# Patient Record
Sex: Male | Born: 1940
Health system: Southern US, Community
[De-identification: ages and names within clinical notes are randomized; demographics above are authoritative.]

## PROBLEM LIST (undated history)

## (undated) DIAGNOSIS — M47812 Spondylosis without myelopathy or radiculopathy, cervical region: Secondary | ICD-10-CM

## (undated) DIAGNOSIS — R011 Cardiac murmur, unspecified: Secondary | ICD-10-CM

## (undated) DIAGNOSIS — T7840XA Allergy, unspecified, initial encounter: Secondary | ICD-10-CM

## (undated) DIAGNOSIS — R7611 Nonspecific reaction to tuberculin skin test without active tuberculosis: Secondary | ICD-10-CM

## (undated) HISTORY — PX: DENTAL SURGERY: SHX609

## (undated) HISTORY — PX: TONSILLECTOMY: SUR1361

## (undated) HISTORY — DX: Cardiac murmur, unspecified: R01.1

## (undated) HISTORY — DX: Spondylosis without myelopathy or radiculopathy, cervical region: M47.812

## (undated) HISTORY — DX: Nonspecific reaction to tuberculin skin test without active tuberculosis: R76.11

## (undated) HISTORY — DX: Allergy, unspecified, initial encounter: T78.40XA

---

## 2011-01-20 HISTORY — PX: COLONOSCOPY: SHX174

## 2011-01-20 HISTORY — PX: UPPER GASTROINTESTINAL ENDOSCOPY: SHX188

## 2011-02-06 LAB — HM COLONOSCOPY

## 2012-02-25 ENCOUNTER — Ambulatory Visit (INDEPENDENT_AMBULATORY_CARE_PROVIDER_SITE_OTHER): Payer: Medicare HMO | Admitting: Internal Medicine

## 2012-02-25 ENCOUNTER — Encounter: Payer: Self-pay | Admitting: Internal Medicine

## 2012-02-25 ENCOUNTER — Ambulatory Visit (HOSPITAL_BASED_OUTPATIENT_CLINIC_OR_DEPARTMENT_OTHER)
Admission: RE | Admit: 2012-02-25 | Discharge: 2012-02-25 | Disposition: A | Discharge status: Home / Self Care | Payer: Medicare HMO | Source: Ambulatory Visit | Attending: Internal Medicine | Admitting: Internal Medicine

## 2012-02-25 VITALS — BP 124/70 | HR 84 | Temp 98.3°F | Resp 16 | Ht 66.5 in | Wt 168.0 lb

## 2012-02-25 DIAGNOSIS — R053 Chronic cough: Secondary | ICD-10-CM | POA: Insufficient documentation

## 2012-02-25 DIAGNOSIS — R05 Cough: Secondary | ICD-10-CM

## 2012-02-25 DIAGNOSIS — R0789 Other chest pain: Secondary | ICD-10-CM

## 2012-02-25 DIAGNOSIS — I517 Cardiomegaly: Secondary | ICD-10-CM | POA: Insufficient documentation

## 2012-02-25 DIAGNOSIS — M47812 Spondylosis without myelopathy or radiculopathy, cervical region: Secondary | ICD-10-CM | POA: Insufficient documentation

## 2012-02-25 DIAGNOSIS — Z0000 Encounter for general adult medical examination without abnormal findings: Secondary | ICD-10-CM | POA: Insufficient documentation

## 2012-02-25 DIAGNOSIS — U071 COVID-19: Secondary | ICD-10-CM | POA: Insufficient documentation

## 2012-02-25 DIAGNOSIS — D649 Anemia, unspecified: Secondary | ICD-10-CM

## 2012-02-25 DIAGNOSIS — R059 Cough, unspecified: Secondary | ICD-10-CM

## 2012-02-25 DIAGNOSIS — R7611 Nonspecific reaction to tuberculin skin test without active tuberculosis: Secondary | ICD-10-CM | POA: Insufficient documentation

## 2012-02-25 DIAGNOSIS — K648 Other hemorrhoids: Secondary | ICD-10-CM | POA: Insufficient documentation

## 2012-02-25 DIAGNOSIS — R079 Chest pain, unspecified: Secondary | ICD-10-CM

## 2012-02-25 MED ORDER — LEVOFLOXACIN 500 MG PO TABS: 500.0000 mg | ORAL_TABLET | Freq: Every day | ORAL | Status: DC

## 2012-02-25 NOTE — Assessment & Plan Note (Signed)
EKG obtained demonstrates NSR 63 with first degree AV block. Normal axis. No evidence of acute ischemic change. Obtain CXR

## 2012-02-25 NOTE — Addendum Note (Signed)
Addended by: Regis Bill on: 02/25/2012 05:22 PM   Modules accepted: Orders

## 2012-02-25 NOTE — Assessment & Plan Note (Signed)
Reports since URI now s/p two courses of amox. Obtain cxr and begin 7d course of levaquin. Schedule 3 wk f/u.

## 2012-02-25 NOTE — Assessment & Plan Note (Signed)
Resolved with colonoscopy/egd 2012. hgb nl 10/13. Consider repeat cbc in the spring for stability

## 2012-02-25 NOTE — Progress Notes (Signed)
  Subjective:    Patient ID: Bradley Peterson, male    DOB: 1940-09-21, 72 y.o.   MRN: [id number]  HPI Pt presents to clinic to establish care and for evaluation of cough. Had his physical at [facility] Oct 2013 and nl labs are reviewed with pt. Since that time had URI with persistent cough tx'ed with two courses of amoxicillin. Improved with both courses however NP cough has persisted. Also notes intermittent associated left sided chest pain that began after the cough. Sx's are nonradiating and non exertional-not being reproduced with his regular walk of ~1.5 miles. Takes a daily asa and recalls reportedly nl stress test ~ 4 years ago.   Has h/o chronic neck pain previously followed by neurology. Currently takes only glucosamine for the problem. Recalls h/o +PPD s/p INH with multiple reportedly nl CXR's. Denies sweats or hemoptysis. Did have single episode of left knee effusion s/p arthrocentesis with no recurrence. Was previously anemic 2012 and underwent colonoscopy demonstrating IH only and had nl EGD as well. Was recommended for 5y follow up with colonoscopy. No fam hx of colon cancer.   Has recevied flu vaccine for the season. Recalls pneumovax ~2012, tdap within 4 years and s/p zostavax.     Review of Systems  Respiratory: Positive for cough. Negative for shortness of breath.   Cardiovascular: Positive for chest pain.  Musculoskeletal: Positive for arthralgias.  All other systems reviewed and are negative.       Objective:   Physical Exam  Nursing note and vitals reviewed. Constitutional: He appears well-developed and well-nourished. No distress.  HENT:  Head: Normocephalic and atraumatic.  Right Ear: External ear normal.  Left Ear: External ear normal.  Nose: Nose normal.  Mouth/Throat: Oropharynx is clear and moist. No oropharyngeal exudate.  Eyes: Conjunctivae normal are normal. No scleral icterus.  Neck: Neck supple. No JVD present. Carotid bruit is not present. No  thyromegaly present.  Cardiovascular: Normal rate, regular rhythm and normal heart sounds.  Exam reveals no gallop and no friction rub.   No murmur heard. Pulmonary/Chest: Effort normal and breath sounds normal. No respiratory distress. He has no wheezes. He has no rales.  Lymphadenopathy:    He has no cervical adenopathy.  Neurological: He is alert.  Skin: Skin is warm and dry. He is not diaphoretic.  Psychiatric: He has a normal mood and affect.          Assessment & Plan:

## 2012-03-17 ENCOUNTER — Ambulatory Visit: Payer: Self-pay | Admitting: Family Medicine

## 2012-04-17 ENCOUNTER — Ambulatory Visit (INDEPENDENT_AMBULATORY_CARE_PROVIDER_SITE_OTHER): Payer: Medicare HMO | Admitting: Family

## 2012-04-17 ENCOUNTER — Encounter: Payer: Self-pay | Admitting: Family

## 2012-04-17 VITALS — BP 116/74 | HR 57 | Temp 98.4°F | Resp 16

## 2012-04-17 DIAGNOSIS — J209 Acute bronchitis, unspecified: Secondary | ICD-10-CM

## 2012-04-17 MED ORDER — BENZONATATE 100 MG PO CAPS: 100.0000 mg | ORAL_CAPSULE | Freq: Three times a day (TID) | ORAL | Status: DC | PRN

## 2012-04-17 MED ORDER — NEOMYCIN-POLYMYXIN-HC OP SUSP: 1.0000 [drp] | Freq: Four times a day (QID) | OPHTHALMIC | Status: AC

## 2012-04-17 MED ORDER — AZITHROMYCIN 250 MG PO TABS: ORAL_TABLET | ORAL | Status: DC

## 2012-04-17 NOTE — Patient Instructions (Addendum)
Please call if symptoms worsen or if not improved in 2-3 days.   

## 2012-04-17 NOTE — Progress Notes (Signed)
  Subjective:    Patient ID: Bradley Peterson, male    DOB: [birth year], 72 y.o.   MRN: [id number]  HPI  Bradley Peterson is a 72 yr old male who presents today with complaint of hoarseness, nasal congestion, cough.  Symptoms started 1 week ago.  Also has reports of "matting of the eyes in the morning."  Cough is worse at night and early in the AM.  Productive of green/yellow phlegm.  He denies associated fever. Has tried robitussin, cough/cold prep otc with minimal improvement.      Review of Systems See HPI  Past Medical History  Diagnosis Date  . Arthritis of neck   . Positive TB test     History   Social History  . Marital Status: Married    Spouse Name: N/A    Number of Children: N/A  . Years of Education: N/A   Occupational History  . Not on file.   Social History Main Topics  . Smoking status: Former Smoker    Types: Pipe  . Smokeless tobacco: Not on file     Comment: >25 years ago, pipe only.  . Alcohol Use: Not on file  . Drug Use: Not on file  . Sexually Active: Not on file   Other Topics Concern  . Not on file   Social History Narrative  . No narrative on file    Past Surgical History  Procedure Laterality Date  . Tonsillectomy      Late 65s    Family History  Problem Relation Age of Onset  . Heart disease Father     Deceased  . Arthritis Father   . Arthritis Mother     Living 28    No Known Allergies  Current Outpatient Prescriptions on File Prior to Visit  Medication Sig Dispense Refill  . Cholecalciferol (VITAMIN D3) 2000 UNITS capsule Take 2,000 Units by mouth daily.      . Ferrous Sulfate (IRON) 325 (65 FE) MG TABS Take 1 tablet by mouth daily.      Marland Kitchen glucosamine-chondroitin 500-400 MG tablet Take 1 tablet by mouth daily.      . NON FORMULARY as needed. OTC SINUS Medication.       No current facility-administered medications on file prior to visit.    BP 116/74  Pulse 57  Temp(Src) 98.4 F (36.9 C) (Oral)  Resp 16  SpO2  97%       Objective:   Physical Exam  Constitutional: He is oriented to person, place, and time. He appears well-developed and well-nourished. No distress.  Cardiovascular: Normal rate and regular rhythm.   No murmur heard. Pulmonary/Chest: Effort normal and breath sounds normal. No respiratory distress. He has no wheezes. He has no rales. He exhibits no tenderness.  Musculoskeletal: He exhibits no edema.  Neurological: He is alert and oriented to person, place, and time.  Psychiatric: He has a normal mood and affect. His behavior is normal. Judgment and thought content normal.          Assessment & Plan:

## 2012-04-19 DIAGNOSIS — J209 Acute bronchitis, unspecified: Secondary | ICD-10-CM | POA: Insufficient documentation

## 2012-04-19 NOTE — Assessment & Plan Note (Signed)
rx with zpak, add tessalon prn.

## 2012-09-09 ENCOUNTER — Encounter: Payer: Self-pay | Admitting: Family Medicine

## 2012-09-09 ENCOUNTER — Ambulatory Visit (INDEPENDENT_AMBULATORY_CARE_PROVIDER_SITE_OTHER): Payer: Medicare HMO | Admitting: Family Medicine

## 2012-09-09 VITALS — BP 118/80 | HR 56 | Temp 98.1°F | Ht 66.5 in | Wt 164.1 lb

## 2012-09-09 DIAGNOSIS — H60392 Other infective otitis externa, left ear: Secondary | ICD-10-CM

## 2012-09-09 DIAGNOSIS — H60399 Other infective otitis externa, unspecified ear: Secondary | ICD-10-CM | POA: Insufficient documentation

## 2012-09-09 MED ORDER — NEOMYCIN-POLYMYXIN-HC 3.5-10000-1 OT SOLN: 3.0000 [drp] | Freq: Three times a day (TID) | OTIC | Status: DC

## 2012-09-09 NOTE — Progress Notes (Signed)
Patient ID: Bradley Peterson, male   DOB: 06-08-1940, 72 y.o.   MRN: [id number] Bradley Peterson [id number] 1940/06/12 09/09/2012      Progress Note-Follow Up  Subjective  Chief Complaint  Chief Complaint  Patient presents with  . Otalgia    left ear hurts more than right X since sat    HPI  Patient is a 72 year old Caucasian male who is in today complaining of 4 days worth of increasing left ear pain. He describes a mild itching and irritation. A sense of some mild pressure but no severe pain. Has had some mild discharge in the year as well. Denies any fevers or chills. Does have ongoing sinus issues appear stable. No malaise myalgias. No chest pain, palpitations GI concerns or other acute symptoms.  Past Medical History  Diagnosis Date  . Arthritis of neck   . Positive TB test     Past Surgical History  Procedure Laterality Date  . Tonsillectomy      Late 5s    Family History  Problem Relation Age of Onset  . Heart disease Father     Deceased  . Arthritis Father   . Arthritis Mother     Living 71    History   Social History  . Marital Status: Married    Spouse Name: N/A    Number of Children: N/A  . Years of Education: N/A   Occupational History  . Not on file.   Social History Main Topics  . Smoking status: Former Smoker    Types: Pipe  . Smokeless tobacco: Not on file     Comment: >25 years ago, pipe only.  . Alcohol Use: Not on file  . Drug Use: Not on file  . Sexually Active: Not on file   Other Topics Concern  . Not on file   Social History Narrative  . No narrative on file    Current Outpatient Prescriptions on File Prior to Visit  Medication Sig Dispense Refill  . aspirin 81 MG tablet Take 81 mg by mouth daily.      . Cholecalciferol (VITAMIN D3) 2000 UNITS capsule Take 2,000 Units by mouth daily.      . Ferrous Sulfate (IRON) 325 (65 FE) MG TABS Take 1 tablet by mouth daily.      Marland Kitchen glucosamine-chondroitin 500-400 MG tablet Take 1 tablet  by mouth daily.      . NON FORMULARY as needed. OTC SINUS Medication.       No current facility-administered medications on file prior to visit.    No Known Allergies  Review of Systems  Review of Systems  Constitutional: Negative for fever and malaise/fatigue.  HENT: Negative for congestion.   Eyes: Negative for discharge.  Respiratory: Negative for shortness of breath.   Cardiovascular: Negative for chest pain, palpitations, claudication and leg swelling.  Gastrointestinal: Negative for nausea, abdominal pain and diarrhea.  Genitourinary: Negative for dysuria.  Musculoskeletal: Negative for falls.  Skin: Negative for rash.  Neurological: Negative for loss of consciousness and headaches.  Endo/Heme/Allergies: Negative for polydipsia.  Psychiatric/Behavioral: Negative for depression and suicidal ideas. The patient is not nervous/anxious and does not have insomnia.     Objective  BP 118/80  Pulse 56  Temp(Src) 98.1 F (36.7 C) (Oral)  Ht 5' 6.5" (1.689 m)  Wt 164 lb 1.3 oz (74.426 kg)  BMI 26.09 kg/m2  SpO2 95%  Physical Exam  Physical Exam  Constitutional: He is oriented to person, place, and time and  well-developed, well-nourished, and in no distress. No distress.  HENT:  Head: Normocephalic and atraumatic.  Left external canal erythematous with clear fluid in canal and mild edema, TMs clear b/l. Left posterior auricular LN enlarged mildly tender  Eyes: Conjunctivae are normal.  Neck: Neck supple. No thyromegaly present.  Cardiovascular: Normal rate, regular rhythm and normal heart sounds.   No murmur heard. Pulmonary/Chest: Effort normal and breath sounds normal. No respiratory distress.  Abdominal: Soft. Bowel sounds are normal. He exhibits no distension and no mass. There is no tenderness.  Musculoskeletal: He exhibits no edema.  Neurological: He is alert and oriented to person, place, and time.  Skin: Skin is warm.  Psychiatric: Memory, affect and judgment  normal.      Assessment & Plan  Otitis, externa, infective Cortisporin Otic 3 drops left ear tid

## 2012-09-09 NOTE — Patient Instructions (Addendum)
Otitis Externa Otitis externa is a bacterial or fungal infection of the outer ear canal. This is the area from the eardrum to the outside of the ear. Otitis externa is sometimes called "swimmer's ear." CAUSES  Possible causes of infection include:  Swimming in dirty water.  Moisture remaining in the ear after swimming or bathing.  Mild injury (trauma) to the ear.  Objects stuck in the ear (foreign body).  Cuts or scrapes (abrasions) on the outside of the ear. SYMPTOMS  The first symptom of infection is often itching in the ear canal. Later signs and symptoms may include swelling and redness of the ear canal, ear pain, and yellowish-white fluid (pus) coming from the ear. The ear pain may be worse when pulling on the earlobe. DIAGNOSIS  Your caregiver will perform a physical exam. A sample of fluid may be taken from the ear and examined for bacteria or fungi. TREATMENT  Antibiotic ear drops are often given for 10 to 14 days. Treatment may also include pain medicine or corticosteroids to reduce itching and swelling. PREVENTION   Keep your ear dry. Use the corner of a towel to absorb water out of the ear canal after swimming or bathing.  Avoid scratching or putting objects inside your ear. This can damage the ear canal or remove the protective wax that lines the canal. This makes it easier for bacteria and fungi to grow.  Avoid swimming in lakes, polluted water, or poorly chlorinated pools.  You may use ear drops made of rubbing alcohol and vinegar after swimming. Combine equal parts of white vinegar and alcohol in a bottle. Put 3 or 4 drops into each ear after swimming. HOME CARE INSTRUCTIONS   Apply antibiotic ear drops to the ear canal as prescribed by your caregiver.  Only take over-the-counter or prescription medicines for pain, discomfort, or fever as directed by your caregiver.  If you have diabetes, follow any additional treatment instructions from your caregiver.  Keep all  follow-up appointments as directed by your caregiver. SEEK MEDICAL CARE IF:   You have a fever.  Your ear is still red, swollen, painful, or draining pus after 3 days.  Your redness, swelling, or pain gets worse.  You have a severe headache.  You have redness, swelling, pain, or tenderness in the area behind your ear. MAKE SURE YOU:   Understand these instructions.  Will watch your condition.  Will get help right away if you are not doing well or get worse. Document Released: 02/05/2005 Document Revised: 04/30/2011 Document Reviewed: 02/22/2011 [employer] Patient Information 2014 [employer].  

## 2012-09-09 NOTE — Assessment & Plan Note (Signed)
Cortisporin Otic 3 drops left ear tid

## 2012-11-13 ENCOUNTER — Telehealth: Payer: Self-pay | Admitting: Family Medicine

## 2012-11-13 DIAGNOSIS — D649 Anemia, unspecified: Secondary | ICD-10-CM

## 2012-11-13 DIAGNOSIS — Z0000 Encounter for general adult medical examination without abnormal findings: Secondary | ICD-10-CM

## 2012-11-13 NOTE — Telephone Encounter (Signed)
Patient has cpe on 12/23/12 and will be going to [facility] lab

## 2012-11-13 NOTE — Telephone Encounter (Signed)
Preventative lipid renal, cbc, tsh, hepatic, psa

## 2012-11-13 NOTE — Telephone Encounter (Signed)
Please advise 

## 2012-11-13 NOTE — Telephone Encounter (Signed)
Please advise what labs you would like ordered.

## 2012-11-14 NOTE — Addendum Note (Signed)
Addended by: Court Joy on: 11/14/2012 10:11 AM   Modules accepted: Orders

## 2012-12-16 LAB — LIPID PANEL: LDL Cholesterol: 93 mg/dL (ref 0–99)

## 2012-12-16 LAB — RENAL FUNCTION PANEL
CO2: 25 mEq/L (ref 19–32)
Chloride: 104 mEq/L (ref 96–112)
Potassium: 4.3 mEq/L (ref 3.5–5.3)
Sodium: 140 mEq/L (ref 135–145)

## 2012-12-16 LAB — CBC
HCT: 40.7 % (ref 39.0–52.0)
MCV: 89.6 fL (ref 78.0–100.0)
RBC: 4.54 MIL/uL (ref 4.22–5.81)
WBC: 5.3 10*3/uL (ref 4.0–10.5)

## 2012-12-16 LAB — HEPATIC FUNCTION PANEL
ALT: 19 U/L (ref 0–53)
AST: 22 U/L (ref 0–37)
Bilirubin, Direct: 0.2 mg/dL (ref 0.0–0.3)
Indirect Bilirubin: 0.4 mg/dL (ref 0.0–0.9)

## 2012-12-23 ENCOUNTER — Ambulatory Visit (INDEPENDENT_AMBULATORY_CARE_PROVIDER_SITE_OTHER): Payer: Medicare HMO | Admitting: Family Medicine

## 2012-12-23 ENCOUNTER — Telehealth: Payer: Self-pay | Admitting: Family Medicine

## 2012-12-23 ENCOUNTER — Encounter: Payer: Self-pay | Admitting: Family Medicine

## 2012-12-23 VITALS — BP 126/80 | HR 59 | Temp 98.1°F | Ht 66.5 in | Wt 162.0 lb

## 2012-12-23 DIAGNOSIS — R0789 Other chest pain: Secondary | ICD-10-CM

## 2012-12-23 DIAGNOSIS — I517 Cardiomegaly: Secondary | ICD-10-CM

## 2012-12-23 DIAGNOSIS — Z0000 Encounter for general adult medical examination without abnormal findings: Secondary | ICD-10-CM

## 2012-12-23 DIAGNOSIS — M47812 Spondylosis without myelopathy or radiculopathy, cervical region: Secondary | ICD-10-CM

## 2012-12-23 DIAGNOSIS — R011 Cardiac murmur, unspecified: Secondary | ICD-10-CM

## 2012-12-23 DIAGNOSIS — Z23 Encounter for immunization: Secondary | ICD-10-CM

## 2012-12-23 DIAGNOSIS — D649 Anemia, unspecified: Secondary | ICD-10-CM

## 2012-12-23 MED ORDER — FLUTICASONE PROPIONATE 50 MCG/ACT NA SUSP: 2.0000 | Freq: Every day | NASAL | Status: DC | PRN

## 2012-12-23 NOTE — Patient Instructions (Addendum)
Salon pas patches or cream Probiotic daily such as Digestive Advantage Zyrtec/cetirizine 10 mg daily Saline nasal flush once to twice daily   Heart Murmur A heart murmur is an extra sound heard by your caregiver when listening to your heart with a device called a stethoscope. The sound might be a "hum" or "whoosh" sound heard when the heart beats. The sound comes from turbulence when blood flows through the heart. There are two types of heart murmurs:  Innocent (Harmless) murmurs: Most people with this type of heart murmur do not have signs or symptoms of heart problems. Many children have innocent heart murmurs. When an innocent heart murmur is found, there is no need to get tests or do treatment. Also, there is no need to restrict activities or stop playing sports. Innocent heart murmurs may be caused by many things. For example, it might be caused by a tiny hole or defect in the wall of the heart. These defects often close as a child grows. An innocent heart murmur may be heard by an examining clinician throughout your life. If you see a new caregiver, please let him or her know this was found during past exams.  Abnormal murmurs: May have signs and symptoms of heart problems. These types of murmurs can occur in children and adults. In children, abnormal heart murmurs are typically caused from heart defects that are present at birth. In adults, abnormal murmurs are usually from heart valve problems caused by disease, infection, or aging. SYMPTOMS   Innocent (Harmless) murmurs do not cause symptoms or require you to limit physical activity.  Many people with abnormal murmurs may or may not have symptoms. If symptoms do develop, they might include:  Shortness of breath.  Blue coloring of the skin, especially on the fingertips.  Chest pain.  Palpitations or feeling a "fluttering" or a "skipped" heart beat.  Fainting.  Persistent cough.  Getting tired much faster than  expected. DIAGNOSIS  A heart murmur might be heard during a pre-sports physical or during any type of examination. When a murmur is heard, it may suggest a possible problem. When this happens, your caregiver may ask you to see a heart specialist (cardiologist). You may also be asked to undergo one or more heart tests. In these cases, testing may vary depending upon what your caregiver heard. Tests for a heart murmur might include one or more of the following:  EKG (electrocardiogram).  Echocardiogram.  Cardiac MRI. For children and adults who have an abnormal heart murmur and want to play sports, it is important to complete testing, review test results, and receive recommendations from your caregiver. If heart disease is present, it may be risky to play. Finding out the results of your test Not all test results are available during your visit. If your test results are not back during the visit, make an appointment with your caregiver to find out the results. Do not assume everything is normal if you have not heard from your caregiver or the medical facility. It is important for you to follow up on all of your test results.  TREATMENT  As noted above, innocent (harmless) murmurs require no treatment or activity restriction. If the murmur represents a problem with the heart, treatment will depend upon the exact nature of the problem. In these cases, medicine or surgery may be needed to treat the problem. HOME CARE INSTRUCTIONS If you want to participate in sports or other types of strenuous physical activity, it is important to discuss  this first with your caregiver. If the murmur represents a problem with the heart and you choose to participate in sports, there is a small chance that a serious problem (including sudden death) could result.  SEEK MEDICAL CARE IF:   You feel that your symptoms are slowly worsening.  You develop any new symptoms that cause concern.  You feel that you are having  side effects from any medicines prescribed. SEEK IMMEDIATE MEDICAL CARE IF:   Chest pain develops.  You are short of breath.  You notice that your heart beats irregularly often enough to cause you to worry.  You have fainting spells.  There is a worsening of any problems that brought you or your child in for medical care. Document Released: 03/15/2004 Document Revised: 04/30/2011 Document Reviewed: 04/15/2007 [facility] Patient Information 2014 [place], [place].

## 2012-12-23 NOTE — Telephone Encounter (Signed)
LAB ORDER WEEK OF 12-15-2013 Next visit annual lipid, renal, hepatic, psa, cbc, psa

## 2012-12-24 ENCOUNTER — Ambulatory Visit (HOSPITAL_BASED_OUTPATIENT_CLINIC_OR_DEPARTMENT_OTHER)
Admission: RE | Admit: 2012-12-24 | Discharge: 2012-12-24 | Disposition: A | Discharge status: Home / Self Care | Payer: Medicare HMO | Source: Ambulatory Visit | Attending: Family Medicine | Admitting: Family Medicine

## 2012-12-24 ENCOUNTER — Telehealth: Payer: Self-pay | Admitting: Family Medicine

## 2012-12-24 DIAGNOSIS — R011 Cardiac murmur, unspecified: Secondary | ICD-10-CM | POA: Insufficient documentation

## 2012-12-24 DIAGNOSIS — R0789 Other chest pain: Secondary | ICD-10-CM | POA: Insufficient documentation

## 2012-12-24 DIAGNOSIS — Z87891 Personal history of nicotine dependence: Secondary | ICD-10-CM | POA: Insufficient documentation

## 2012-12-24 DIAGNOSIS — I359 Nonrheumatic aortic valve disorder, unspecified: Secondary | ICD-10-CM

## 2012-12-24 DIAGNOSIS — D649 Anemia, unspecified: Secondary | ICD-10-CM | POA: Insufficient documentation

## 2012-12-24 DIAGNOSIS — I079 Rheumatic tricuspid valve disease, unspecified: Secondary | ICD-10-CM | POA: Insufficient documentation

## 2012-12-24 NOTE — Telephone Encounter (Signed)
Received medical records from [facility]  P: [phone] F: [phone]

## 2012-12-24 NOTE — Progress Notes (Signed)
*  PRELIMINARY RESULTS* Echocardiogram 2D Echocardiogram has been performed.  Bradley Peterson 12/24/2012, 9:45 AM

## 2012-12-26 NOTE — Telephone Encounter (Signed)
Lab order entered.

## 2012-12-28 ENCOUNTER — Encounter: Payer: Self-pay | Admitting: Family Medicine

## 2012-12-28 DIAGNOSIS — Z0000 Encounter for general adult medical examination without abnormal findings: Secondary | ICD-10-CM | POA: Insufficient documentation

## 2012-12-28 NOTE — Assessment & Plan Note (Signed)
Given flu shot today, encouraged heart healthy diet and regular exercise. Receives his colonoscopy at [facility]. Denies recent falls or depression no advanced directives.

## 2012-12-28 NOTE — Assessment & Plan Note (Signed)
resolved 

## 2012-12-28 NOTE — Assessment & Plan Note (Signed)
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 Referred to cardiology

## 2012-12-28 NOTE — Progress Notes (Signed)
Patient ID: Bradley Peterson, male   DOB: 16-Dec-1940, 72 y.o.   MRN: [id number] Bradley Peterson [id number] [birth year] 12/28/2012      Progress Note-Follow Up  Subjective  Chief Complaint  Chief Complaint  Patient presents with  . Annual Exam    physical  . Injections    flu- high dose    HPI  Patient is a 72 year old caucasian male in today for routine medical care. No recent illness although he has had some recent sinus pressure and chest wall discomfort. He reports it has been occuring off and on for years and has not associated symptoms, such as palpitations, cough SOB, Gi or GU c/o. He notes that his pain occurs more in the am but can happen at any time.  He acknowledges he had an Echo years ago which showed some LVH.   Past Medical History  Diagnosis Date  . Arthritis of neck   . Positive TB test   . Annual physical exam 12/28/2012    Past Surgical History  Procedure Laterality Date  . Tonsillectomy      Late 77s    Family History  Problem Relation Age of Onset  . Heart disease Father     CABG at 41.  . Arthritis Father   . Arthritis Mother     Living 58  . Hypertension Mother     History   Social History  . Marital Status: Married    Spouse Name: N/A    Number of Children: N/A  . Years of Education: N/A   Occupational History  . Not on file.   Social History Main Topics  . Smoking status: Former Smoker    Types: Pipe    Start date: 02/19/1977  . Smokeless tobacco: Not on file     Comment: >25 years ago, pipe only.  . Alcohol Use: Not on file  . Drug Use: Not on file  . Sexual Activity: Not on file   Other Topics Concern  . Not on file   Social History Narrative  . No narrative on file    Current Outpatient Prescriptions on File Prior to Visit  Medication Sig Dispense Refill  . aspirin 81 MG tablet Take 81 mg by mouth daily.      . Cholecalciferol (VITAMIN D3) 2000 UNITS capsule Take 2,000 Units by mouth daily.      . Ferrous Sulfate  (IRON) 325 (65 FE) MG TABS Take 1 tablet by mouth daily.      Marland Kitchen glucosamine-chondroitin 500-400 MG tablet Take 1 tablet by mouth daily.      Marland Kitchen neomycin-polymyxin-hydrocortisone (CORTISPORIN) otic solution Place 3 drops into the left ear 3 (three) times daily.  10 mL  0  . NON FORMULARY as needed. OTC SINUS Medication.       No current facility-administered medications on file prior to visit.    No Known Allergies  Review of Systems  Review of Systems  Constitutional: Negative for fever and malaise/fatigue.  HENT: Negative for congestion.   Eyes: Negative for discharge.  Respiratory: Negative for shortness of breath.   Cardiovascular: Negative for chest pain, palpitations and leg swelling.  Gastrointestinal: Negative for nausea, abdominal pain and diarrhea.  Genitourinary: Negative for dysuria.  Musculoskeletal: Positive for neck pain. Negative for falls.  Skin: Negative for rash.  Neurological: Negative for loss of consciousness and headaches.  Endo/Heme/Allergies: Negative for polydipsia.  Psychiatric/Behavioral: Negative for depression and suicidal ideas. The patient is not nervous/anxious and does not  have insomnia.     Objective  BP 126/80  Pulse 59  Temp(Src) 98.1 F (36.7 C) (Oral)  Ht 5' 6.5" (1.689 m)  Wt 162 lb (73.483 kg)  BMI 25.76 kg/m2  SpO2 94%  Physical Exam  Physical Exam  Constitutional: He is oriented to person, place, and time and well-developed, well-nourished, and in no distress. No distress.  HENT:  Head: Normocephalic and atraumatic.  Eyes: Conjunctivae are normal.  Neck: Neck supple. No thyromegaly present.  Cardiovascular: Normal rate, regular rhythm and normal heart sounds.   No murmur heard. Pulmonary/Chest: Effort normal and breath sounds normal. No respiratory distress.  Abdominal: He exhibits no distension and no mass. There is no tenderness.  Musculoskeletal: He exhibits no edema.  Neurological: He is alert and oriented to person,  place, and time.  Skin: Skin is warm.  Psychiatric: Memory, affect and judgment normal.    Lab Results  Component Value Date   TSH 0.972 12/16/2012   Lab Results  Component Value Date   WBC 5.3 12/16/2012   HGB 14.5 12/16/2012   HCT 40.7 12/16/2012   MCV 89.6 12/16/2012   PLT 214 12/16/2012   Lab Results  Component Value Date   CREATININE 0.83 12/16/2012   BUN 15 12/16/2012   NA 140 12/16/2012   K 4.3 12/16/2012   CL 104 12/16/2012   CO2 25 12/16/2012   Lab Results  Component Value Date   ALT 19 12/16/2012   AST 22 12/16/2012   ALKPHOS 59 12/16/2012   BILITOT 0.6 12/16/2012   Lab Results  Component Value Date   CHOL 165 12/16/2012   Lab Results  Component Value Date   HDL 53 12/16/2012   Lab Results  Component Value Date   LDLCALC 93 12/16/2012   Lab Results  Component Value Date   TRIG 96 12/16/2012   Lab Results  Component Value Date   CHOLHDL 3.1 12/16/2012     Assessment & Plan  Annual physical exam Given flu shot today, encouraged heart healthy diet and regular exercise. Receives his colonoscopy at [facility]. Denies recent falls or depression no advanced directives.   Osteoarthritis of neck Encouraged exercise as tolerated. Try Salon Pas prn, encouraged moist heat.   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 Referred to cardiology

## 2012-12-28 NOTE — Assessment & Plan Note (Signed)
Encouraged exercise as tolerated. Try Salon Pas prn, encouraged moist heat.

## 2013-01-07 ENCOUNTER — Ambulatory Visit (INDEPENDENT_AMBULATORY_CARE_PROVIDER_SITE_OTHER): Payer: Medicare HMO | Admitting: Cardiology

## 2013-01-07 ENCOUNTER — Encounter: Payer: Self-pay | Admitting: Cardiology

## 2013-01-07 VITALS — BP 130/84 | HR 53 | Ht 67.5 in | Wt 167.9 lb

## 2013-01-07 DIAGNOSIS — R931 Abnormal findings on diagnostic imaging of heart and coronary circulation: Secondary | ICD-10-CM

## 2013-01-07 DIAGNOSIS — R9389 Abnormal findings on diagnostic imaging of other specified body structures: Secondary | ICD-10-CM

## 2013-01-07 DIAGNOSIS — R079 Chest pain, unspecified: Secondary | ICD-10-CM

## 2013-01-07 NOTE — Patient Instructions (Signed)
The current medical regimen is effective;  continue present plan and medications.  Your physician has requested that you have an exercise tolerance test. For further information please visit [email or web address]. Please also follow instruction sheet, as given.   

## 2013-01-07 NOTE — Progress Notes (Signed)
HPI The patient presents for evaluation of a heart murmur and chest discomfort. He has been told of a heart murmur in the past. He was having some chest discomfort. He had an echocardiogram and I have reviewed this result. There is some mild thickening of his aortic valve. There is mild mitral regurgitation. There is reported mild left atrial and right atrial enlargement. He has normal left ventricular size and function. He does get some chest discomfort. This has been ongoing for some time and might be slightly worse over time it was previously. However, it is sporadic. It occurs at rest. It may last for a few seconds. It somewhat sharp and not associated with other symptoms. It may be on the left side of his chest or on the right.  He cannot bring this on with activity. Back he walks routinely without bringing on any of the symptoms. He denies any shortness of breath, PND or orthopnea. He hasn't noticed any palpitations, presyncope or syncope. He has had no weight gain or edema.  No Known Allergies And we Current Outpatient Prescriptions  Medication Sig Dispense Refill  . aspirin 81 MG tablet Take 81 mg by mouth daily.      . Cholecalciferol (VITAMIN D3) 2000 UNITS capsule Take 2,000 Units by mouth daily.      . Ferrous Sulfate (IRON) 325 (65 FE) MG TABS Take 1 tablet by mouth. Taking every other day      . fluticasone (FLONASE) 50 MCG/ACT nasal spray Place 2 sprays into both nostrils daily as needed for allergies or rhinitis.  16 g  5  . NON FORMULARY as needed. OTC SINUS Medication.       No current facility-administered medications for this visit.    Past Medical History  Diagnosis Date  . Arthritis of neck   . Positive TB test   . Annual physical exam 12/28/2012    Past Surgical History  Procedure Laterality Date  . Tonsillectomy      Late 11s    Family History  Problem Relation Age of Onset  . Heart disease Father     CABG at 26.  . Arthritis Father   . Arthritis Mother      Living 5  . Hypertension Mother     History   Social History  . Marital Status: Married    Spouse Name: N/A    Number of Children: N/A  . Years of Education: N/A   Occupational History  . Not on file.   Social History Main Topics  . Smoking status: Former Smoker    Types: Pipe    Start date: 02/19/1977  . Smokeless tobacco: Not on file     Comment: >25 years ago, pipe only.  . Alcohol Use: Yes     Comment: Maybe 1/2 drink a week  . Drug Use: No  . Sexual Activity: Not on file   Other Topics Concern  . Not on file   Social History Narrative  . No narrative on file    ROS:  Positive for headaches , dizziness, joint pains.  Otherwise as stated in the HPI and negative for all other systems.  PHYSICAL EXAM BP 130/84  Pulse 53  Ht 5' 7.5" (1.715 m)  Wt 167 lb 14.4 oz (76.159 kg)  BMI 25.89 kg/m2 GENERAL:  Well appearing HEENT:  Pupils equal round and reactive, fundi not visualized, oral mucosa unremarkable NECK:  No jugular venous distention, waveform within normal limits, carotid upstroke brisk and symmetric,  no bruits, no thyromegaly LYMPHATICS:  No cervical, inguinal adenopathy LUNGS:  Clear to auscultation bilaterally BACK:  No CVA tenderness CHEST:  Unremarkable HEART:  PMI not displaced or sustained,S1 and S2 within normal limits, no S3, no S4, no clicks, no rubs, no murmurs ABD:  Flat, positive bowel sounds normal in frequency in pitch, no bruits, no rebound, no guarding, no midline pulsatile mass, no hepatomegaly, no splenomegaly EXT:  2 plus pulses throughout, no edema, no cyanosis no clubbing SKIN:  No rashes no nodules NEURO:  Cranial nerves II through XII grossly intact, motor grossly intact throughout PSYCH:  Cognitively intact, oriented to person place and time   EKG:  A saw was sinus bradycardia, rate 53, poor anterior R wave progression, no acute ST-T wave changes.  ASSESSMENT AND PLAN  CHEST PAIN:  This is atypical. However, he does have a  family history of coronary artery disease.  I will bring the patient back for a POET (Plain Old Exercise Test). This will allow me to screen for obstructive coronary disease, risk stratify and very importantly provide a prescription for exercise.  ABNORMAL ECHOCARDIOGRAM:  I do not appreciate a murmur. He has some mild findings on his echo is described above. At this point I would not think that further cardiovascular testing is necessary. Rather the patient can be followed with repeat physical exams.

## 2013-01-09 ENCOUNTER — Telehealth (HOSPITAL_COMMUNITY): Payer: Self-pay | Admitting: *Deleted

## 2013-01-20 ENCOUNTER — Ambulatory Visit (HOSPITAL_COMMUNITY)
Admission: RE | Admit: 2013-01-20 | Discharge: 2013-01-20 | Disposition: A | Discharge status: Home / Self Care | Payer: Medicare HMO | Source: Ambulatory Visit | Attending: Cardiology | Admitting: Cardiology

## 2013-01-20 DIAGNOSIS — R079 Chest pain, unspecified: Secondary | ICD-10-CM | POA: Insufficient documentation

## 2013-01-20 DIAGNOSIS — Z8249 Family history of ischemic heart disease and other diseases of the circulatory system: Secondary | ICD-10-CM | POA: Insufficient documentation

## 2013-03-16 ENCOUNTER — Telehealth: Payer: Self-pay | Admitting: *Deleted

## 2013-03-16 NOTE — Telephone Encounter (Signed)
Please make sure that the patient knows that this (GXT) result was negative.    Pt aware

## 2013-07-31 ENCOUNTER — Encounter: Payer: Self-pay | Admitting: Physician Assistant

## 2013-07-31 ENCOUNTER — Ambulatory Visit (INDEPENDENT_AMBULATORY_CARE_PROVIDER_SITE_OTHER): Payer: Medicare HMO | Admitting: Physician Assistant

## 2013-07-31 VITALS — BP 120/82 | HR 65 | Temp 98.1°F | Resp 16 | Ht 66.5 in | Wt 168.8 lb

## 2013-07-31 DIAGNOSIS — J019 Acute sinusitis, unspecified: Secondary | ICD-10-CM | POA: Insufficient documentation

## 2013-07-31 MED ORDER — AZITHROMYCIN 250 MG PO TABS: ORAL_TABLET | ORAL | Status: DC

## 2013-07-31 NOTE — Progress Notes (Signed)
Pre visit review using our clinic review tool, if applicable. No additional management support is needed unless otherwise documented below in the visit note/SLS  

## 2013-07-31 NOTE — Assessment & Plan Note (Signed)
Rx azithromycin.  Increase fluids. Rest.  Saline nasal spray.  Continue Flonase.  Delsym for cough.  Humidifier in bedroom.

## 2013-07-31 NOTE — Patient Instructions (Signed)
Please take antibiotic as directed.  Increase fluid intake.  Use Saline nasal spray.  Take a daily multivitamin. Continue Flonase daily.  Take Delsym as directed for cough.  Place a humidifier in the bedroom.  Please call or return clinic if symptoms are not improving.  Sinusitis Sinusitis is redness, soreness, and swelling (inflammation) of the paranasal sinuses. Paranasal sinuses are air pockets within the bones of your face (beneath the eyes, the middle of the forehead, or above the eyes). In healthy paranasal sinuses, mucus is able to drain out, and air is able to circulate through them by way of your nose. However, when your paranasal sinuses are inflamed, mucus and air can become trapped. This can allow bacteria and other germs to grow and cause infection. Sinusitis can develop quickly and last only a short time (acute) or continue over a long period (chronic). Sinusitis that lasts for more than 12 weeks is considered chronic.  CAUSES  Causes of sinusitis include:  Allergies.  Structural abnormalities, such as displacement of the cartilage that separates your nostrils (deviated septum), which can decrease the air flow through your nose and sinuses and affect sinus drainage.  Functional abnormalities, such as when the small hairs (cilia) that line your sinuses and help remove mucus do not work properly or are not present. SYMPTOMS  Symptoms of acute and chronic sinusitis are the same. The primary symptoms are pain and pressure around the affected sinuses. Other symptoms include:  Upper toothache.  Earache.  Headache.  Bad breath.  Decreased sense of smell and taste.  A cough, which worsens when you are lying flat.  Fatigue.  Fever.  Thick drainage from your nose, which often is green and may contain pus (purulent).  Swelling and warmth over the affected sinuses. DIAGNOSIS  Your caregiver will perform a physical exam. During the exam, your caregiver may:  Look in your nose  for signs of abnormal growths in your nostrils (nasal polyps).  Tap over the affected sinus to check for signs of infection.  View the inside of your sinuses (endoscopy) with a special imaging device with a light attached (endoscope), which is inserted into your sinuses. If your caregiver suspects that you have chronic sinusitis, one or more of the following tests may be recommended:  Allergy tests.  Nasal culture A sample of mucus is taken from your nose and sent to a lab and screened for bacteria.  Nasal cytology A sample of mucus is taken from your nose and examined by your caregiver to determine if your sinusitis is related to an allergy. TREATMENT  Most cases of acute sinusitis are related to a viral infection and will resolve on their own within 10 days. Sometimes medicines are prescribed to help relieve symptoms (pain medicine, decongestants, nasal steroid sprays, or saline sprays).  However, for sinusitis related to a bacterial infection, your caregiver will prescribe antibiotic medicines. These are medicines that will help kill the bacteria causing the infection.  Rarely, sinusitis is caused by a fungal infection. In theses cases, your caregiver will prescribe antifungal medicine. For some cases of chronic sinusitis, surgery is needed. Generally, these are cases in which sinusitis recurs more than 3 times per year, despite other treatments. HOME CARE INSTRUCTIONS   Drink plenty of water. Water helps thin the mucus so your sinuses can drain more easily.  Use a humidifier.  Inhale steam 3 to 4 times a day (for example, sit in the bathroom with the shower running).  Apply a warm, moist  washcloth to your face 3 to 4 times a day, or as directed by your caregiver.  Use saline nasal sprays to help moisten and clean your sinuses.  Take over-the-counter or prescription medicines for pain, discomfort, or fever only as directed by your caregiver. SEEK IMMEDIATE MEDICAL CARE IF:  You  have increasing pain or severe headaches.  You have nausea, vomiting, or drowsiness.  You have swelling around your face.  You have vision problems.  You have a stiff neck.  You have difficulty breathing. MAKE SURE YOU:   Understand these instructions.  Will watch your condition.  Will get help right away if you are not doing well or get worse. Document Released: 02/05/2005 Document Revised: 04/30/2011 Document Reviewed: 02/20/2011 [facility] Patient Information 2014 [place], [place].

## 2013-07-31 NOTE — Progress Notes (Signed)
Patient presents to clinic today c/o several weeks of left sided sinus pressure with mild cough and PND .  Endorses sinus pressure has turned into pain over the past 1.5 weeks.  Patient endorses now having tooth pain and increased congestion.  Denies SOB, wheezing, pleuritic chest pain.    Past Medical History  Diagnosis Date  . Arthritis of neck   . Positive TB test   . Annual physical exam 12/28/2012    Current Outpatient Prescriptions on File Prior to Visit  Medication Sig Dispense Refill  . aspirin 81 MG tablet Take 81 mg by mouth daily.      . Cholecalciferol (VITAMIN D3) 2000 UNITS capsule Take 2,000 Units by mouth daily.      . Ferrous Sulfate (IRON) 325 (65 FE) MG TABS Take 1 tablet by mouth. Taking every other day      . fluticasone (FLONASE) 50 MCG/ACT nasal spray Place 2 sprays into both nostrils daily as needed for allergies or rhinitis.  16 g  5  . NON FORMULARY as needed. OTC SINUS Medication.       No current facility-administered medications on file prior to visit.   No Known Allergies  Family History  Problem Relation Age of Onset  . Heart disease Father     CABG at 49, died age 74  . Arthritis Father   . Arthritis Mother     Living 27  . Hypertension Mother     History   Social History  . Marital Status: Married    Spouse Name: N/A    Number of Children: 3  . Years of Education: N/A   Social History Main Topics  . Smoking status: Former Smoker    Types: Pipe    Start date: 02/19/1977  . Smokeless tobacco: None     Comment: >25 years ago, pipe only.  . Alcohol Use: Yes     Comment: Maybe 1/2 drink a week  . Drug Use: No  . Sexual Activity: None   Other Topics Concern  . None   Social History Narrative   Retired Pharmacist, community.    Review of Systems - See HPI.  All other ROS are negative.  BP 120/82  Pulse 65  Temp(Src) 98.1 F (36.7 C) (Oral)  Resp 16  Ht 5' 6.5" (1.689 m)  Wt 168 lb 12 oz (76.544 kg)  BMI 26.83 kg/m2  SpO2  96%  Physical Exam  Vitals reviewed. Constitutional: He is oriented to person, place, and time and well-developed, well-nourished, and in no distress.  HENT:  Head: Normocephalic and atraumatic.  Right Ear: Tympanic membrane, external ear and ear canal normal.  Left Ear: Tympanic membrane, external ear and ear canal normal.  Nose: Right sinus exhibits no maxillary sinus tenderness and no frontal sinus tenderness. Left sinus exhibits maxillary sinus tenderness. Left sinus exhibits no frontal sinus tenderness.  Mouth/Throat: Oropharynx is clear and moist. No oropharyngeal exudate.  Eyes: Conjunctivae are normal. Pupils are equal, round, and reactive to light.  Neck: Neck supple.  Cardiovascular: Normal rate, regular rhythm, normal heart sounds and intact distal pulses.   Pulmonary/Chest: Effort normal and breath sounds normal. No respiratory distress. He has no wheezes. He has no rales. He exhibits no tenderness.  Neurological: He is alert and oriented to person, place, and time.  Skin: Skin is warm and dry. No rash noted.  Psychiatric: Affect normal.   Assessment/Plan: Acute sinusitis with symptoms > 10 days Rx azithromycin.  Increase fluids. Rest.  Saline nasal spray.  Continue Flonase.  Delsym for cough.  Humidifier in bedroom.

## 2013-12-24 ENCOUNTER — Ambulatory Visit (INDEPENDENT_AMBULATORY_CARE_PROVIDER_SITE_OTHER): Payer: Medicare HMO | Admitting: Family Medicine

## 2013-12-24 ENCOUNTER — Ambulatory Visit (INDEPENDENT_AMBULATORY_CARE_PROVIDER_SITE_OTHER): Payer: Medicare HMO

## 2013-12-24 ENCOUNTER — Encounter: Payer: Self-pay | Admitting: Family Medicine

## 2013-12-24 VITALS — BP 109/61 | HR 62 | Temp 98.0°F | Ht 66.5 in | Wt 164.2 lb

## 2013-12-24 DIAGNOSIS — R931 Abnormal findings on diagnostic imaging of heart and coronary circulation: Secondary | ICD-10-CM

## 2013-12-24 DIAGNOSIS — Z23 Encounter for immunization: Secondary | ICD-10-CM

## 2013-12-24 DIAGNOSIS — D649 Anemia, unspecified: Secondary | ICD-10-CM

## 2013-12-24 DIAGNOSIS — Z125 Encounter for screening for malignant neoplasm of prostate: Secondary | ICD-10-CM

## 2013-12-24 DIAGNOSIS — Z0000 Encounter for general adult medical examination without abnormal findings: Secondary | ICD-10-CM

## 2013-12-24 DIAGNOSIS — T7840XD Allergy, unspecified, subsequent encounter: Secondary | ICD-10-CM

## 2013-12-24 LAB — PSA, MEDICARE: PSA: 0.65 ng/ml (ref 0.10–4.00)

## 2013-12-24 LAB — RENAL FUNCTION PANEL
Albumin: 3.8 g/dL (ref 3.5–5.2)
BUN: 20 mg/dL (ref 6–23)
CHLORIDE: 106 meq/L (ref 96–112)
CO2: 27 meq/L (ref 19–32)
Calcium: 9.6 mg/dL (ref 8.4–10.5)
Creatinine, Ser: 0.9 mg/dL (ref 0.4–1.5)
GFR: 92.86 mL/min (ref >60.00)
Glucose, Bld: 85 mg/dL (ref 70–99)
Phosphorus: 3.3 mg/dL (ref 2.3–4.6)
Potassium: 4.4 mEq/L (ref 3.5–5.1)
Sodium: 142 mEq/L (ref 135–145)

## 2013-12-24 LAB — CBC
HEMATOCRIT: 43.6 % (ref 39.0–52.0)
Hemoglobin: 14.3 g/dL (ref 13.0–17.0)
MCHC: 32.8 g/dL (ref 30.0–36.0)
MCV: 93.3 fl (ref 78.0–100.0)
PLATELETS: 229 10*3/uL (ref 150.0–400.0)
RBC: 4.68 Mil/uL (ref 4.22–5.81)
RDW: 13.8 % (ref 11.5–15.5)
WBC: 6.4 10*3/uL (ref 4.0–10.5)

## 2013-12-24 LAB — HEPATIC FUNCTION PANEL
ALBUMIN: 3.8 g/dL (ref 3.5–5.2)
ALT: 21 U/L (ref 0–53)
AST: 25 U/L (ref 0–37)
Alkaline Phosphatase: 69 U/L (ref 39–117)
Bilirubin, Direct: 0.1 mg/dL (ref 0.0–0.3)
TOTAL PROTEIN: 7.1 g/dL (ref 6.0–8.3)
Total Bilirubin: 0.7 mg/dL (ref 0.2–1.2)

## 2013-12-24 LAB — TSH: TSH: 0.81 u[IU]/mL (ref 0.35–4.50)

## 2013-12-24 MED ORDER — MONTELUKAST SODIUM 10 MG PO TABS: 10.0000 mg | ORAL_TABLET | Freq: Every evening | ORAL | Status: DC | PRN

## 2013-12-24 NOTE — Patient Instructions (Addendum)
Soak in distilled white vinegar for 15 minutes each night then apply vick vapor rub, Lavendar oil   Ringworm, Nail A fungal infection of the nail (tinea unguium/onychomycosis) is common. It is common as the visible part of the nail is composed of dead cells which have no blood supply to help prevent infection. It occurs because fungi are everywhere and will pick any opportunity to grow on any dead material. Because nails are very slow growing they require up to 2 years of treatment with anti-fungal medications. The entire nail back to the base is infected. This includes approximately  of the nail which you cannot see. If your caregiver has prescribed a medication by mouth, take it every day and as directed. No progress will be seen for at least 6 to 9 months. Do not be disappointed! Because fungi live on dead cells with little or no exposure to blood supply, medication delivery to the infection is slow; thus the cure is slow. It is also why you can observe no progress in the first 6 months. The nail becoming cured is the base of the nail, as it has the blood supply. Topical medication such as creams and ointments are usually not effective. Important in successful treatment of nail fungus is closely following the medication regimen that your doctor prescribes. Sometimes you and your caregiver may elect to speed up this process by surgical removal of all the nails. Even this may still require 6 to 9 months of additional oral medications. See your caregiver as directed. Remember there will be no visible improvement for at least 6 months. See your caregiver sooner if other signs of infection (redness and swelling) develop. Document Released: 02/03/2000 Document Revised: 04/30/2011 Document Reviewed: 04/13/2008 [facility] Patient Information 2015 [place], [place]. This information is not intended to replace advice given to you by your health care provider. Make sure you discuss any questions you have with your  health care provider. Preventive Care for Adults A healthy lifestyle and preventive care can promote health and wellness. Preventive health guidelines for men include the following key practices:  A routine yearly physical is a good way to check with your health care provider about your health and preventative screening. It is a chance to share any concerns and updates on your health and to receive a thorough exam.  Visit your dentist for a routine exam and preventative care every 6 months. Brush your teeth twice a day and floss once a day. Good oral hygiene prevents tooth decay and gum disease.  The frequency of eye exams is based on your age, health, family medical history, use of contact lenses, and other factors. Follow your health care provider's recommendations for frequency of eye exams.  Eat a healthy diet. Foods such as vegetables, fruits, whole grains, low-fat dairy products, and lean protein foods contain the nutrients you need without too many calories. Decrease your intake of foods high in solid fats, added sugars, and salt. Eat the right amount of calories for you.Get information about a proper diet from your health care provider, if necessary.  Regular physical exercise is one of the most important things you can do for your health. Most adults should get at least 150 minutes of moderate-intensity exercise (any activity that increases your heart rate and causes you to sweat) each week. In addition, most adults need muscle-strengthening exercises on 2 or more days a week.  Maintain a healthy weight. The body mass index (BMI) is a screening tool to identify possible weight  problems. It provides an estimate of body fat based on height and weight. Your health care provider can find your BMI and can help you achieve or maintain a healthy weight.For adults 20 years and older:  A BMI below 18.5 is considered underweight.  A BMI of 18.5 to 24.9 is normal.  A BMI of 25 to 29.9 is  considered overweight.  A BMI of 30 and above is considered obese.  Maintain normal blood lipids and cholesterol levels by exercising and minimizing your intake of saturated fat. Eat a balanced diet with plenty of fruit and vegetables. Blood tests for lipids and cholesterol should begin at age 54 and be repeated every 5 years. If your lipid or cholesterol levels are high, you are over 50, or you are at high risk for heart disease, you may need your cholesterol levels checked more frequently.Ongoing high lipid and cholesterol levels should be treated with medicines if diet and exercise are not working.  If you smoke, find out from your health care provider how to quit. If you do not use tobacco, do not start.  Lung cancer screening is recommended for adults aged 31-80 years who are at high risk for developing lung cancer because of a history of smoking. A yearly low-dose CT scan of the lungs is recommended for people who have at least a 30-pack-year history of smoking and are a current smoker or have quit within the past 15 years. A pack year of smoking is smoking an average of 1 pack of cigarettes a day for 1 year (for example: 1 pack a day for 30 years or 2 packs a day for 15 years). Yearly screening should continue until the smoker has stopped smoking for at least 15 years. Yearly screening should be stopped for people who develop a health problem that would prevent them from having lung cancer treatment.  If you choose to drink alcohol, do not have more than 2 drinks per day. One drink is considered to be 12 ounces (355 mL) of beer, 5 ounces (148 mL) of wine, or 1.5 ounces (44 mL) of liquor.  Avoid use of street drugs. Do not share needles with anyone. Ask for help if you need support or instructions about stopping the use of drugs.  High blood pressure causes heart disease and increases the risk of stroke. Your blood pressure should be checked at least every 1-2 years. Ongoing high blood pressure  should be treated with medicines, if weight loss and exercise are not effective.  If you are 17-21 years old, ask your health care provider if you should take aspirin to prevent heart disease.  Diabetes screening involves taking a blood sample to check your fasting blood sugar level. This should be done once every 3 years, after age 66, if you are within normal weight and without risk factors for diabetes. Testing should be considered at a younger age or be carried out more frequently if you are overweight and have at least 1 risk factor for diabetes.  Colorectal cancer can be detected and often prevented. Most routine colorectal cancer screening begins at the age of 57 and continues through age 82. However, your health care provider may recommend screening at an earlier age if you have risk factors for colon cancer. On a yearly basis, your health care provider may provide home test kits to check for hidden blood in the stool. Use of a small camera at the end of a tube to directly examine the colon (sigmoidoscopy or  colonoscopy) can detect the earliest forms of colorectal cancer. Talk to your health care provider about this at age 72, when routine screening begins. Direct exam of the colon should be repeated every 5-10 years through age 73, unless early forms of precancerous polyps or small growths are found.  People who are at an increased risk for hepatitis B should be screened for this virus. You are considered at high risk for hepatitis B if:  You were born in a country where hepatitis B occurs often. Talk with your health care provider about which countries are considered high risk.  Your parents were born in a high-risk country and you have not received a shot to protect against hepatitis B (hepatitis B vaccine).  You have HIV or AIDS.  You use needles to inject street drugs.  You live with, or have sex with, someone who has hepatitis B.  You are a man who has sex with other men  (MSM).  You get hemodialysis treatment.  You take certain medicines for conditions such as cancer, organ transplantation, and autoimmune conditions.  Hepatitis C blood testing is recommended for all people born from [birth year] through 1965 and any individual with known risks for hepatitis C.  Practice safe sex. Use condoms and avoid high-risk sexual practices to reduce the spread of sexually transmitted infections (STIs). STIs include gonorrhea, chlamydia, syphilis, trichomonas, herpes, HPV, and human immunodeficiency virus (HIV). Herpes, HIV, and HPV are viral illnesses that have no cure. They can result in disability, cancer, and death.  If you are at risk of being infected with HIV, it is recommended that you take a prescription medicine daily to prevent HIV infection. This is called preexposure prophylaxis (PrEP). You are considered at risk if:  You are a man who has sex with other men (MSM) and have other risk factors.  You are a heterosexual man, are sexually active, and are at increased risk for HIV infection.  You take drugs by injection.  You are sexually active with a partner who has HIV.  Talk with your health care provider about whether you are at high risk of being infected with HIV. If you choose to begin PrEP, you should first be tested for HIV. You should then be tested every 3 months for as long as you are taking PrEP.  A one-time screening for abdominal aortic aneurysm (AAA) and surgical repair of large AAAs by ultrasound are recommended for men ages 38 to 30 years who are current or former smokers.  Healthy men should no longer receive prostate-specific antigen (PSA) blood tests as part of routine cancer screening. Talk with your health care provider about prostate cancer screening.  Testicular cancer screening is not recommended for adult males who have no symptoms. Screening includes self-exam, a health care provider exam, and other screening tests. Consult with your health  care provider about any symptoms you have or any concerns you have about testicular cancer.  Use sunscreen. Apply sunscreen liberally and repeatedly throughout the day. You should seek shade when your shadow is shorter than you. Protect yourself by wearing long sleeves, pants, a wide-brimmed hat, and sunglasses year round, whenever you are outdoors.  Once a month, do a whole-body skin exam, using a mirror to look at the skin on your back. Tell your health care provider about new moles, moles that have irregular borders, moles that are larger than a pencil eraser, or moles that have changed in shape or color.  Stay current with required vaccines (immunizations).  Influenza vaccine. All adults should be immunized every year.  Tetanus, diphtheria, and acellular pertussis (Td, Tdap) vaccine. An adult who has not previously received Tdap or who does not know his vaccine status should receive 1 dose of Tdap. This initial dose should be followed by tetanus and diphtheria toxoids (Td) booster doses every 10 years. Adults with an unknown or incomplete history of completing a 3-dose immunization series with Td-containing vaccines should begin or complete a primary immunization series including a Tdap dose. Adults should receive a Td booster every 10 years.  Varicella vaccine. An adult without evidence of immunity to varicella should receive 2 doses or a second dose if he has previously received 1 dose.  Human papillomavirus (HPV) vaccine. Males aged 70-21 years who have not received the vaccine previously should receive the 3-dose series. Males aged 22-26 years may be immunized. Immunization is recommended through the age of 68 years for any male who has sex with males and did not get any or all doses earlier. Immunization is recommended for any person with an immunocompromised condition through the age of 70 years if he did not get any or all doses earlier. During the 3-dose series, the second dose should be  obtained 4-8 weeks after the first dose. The third dose should be obtained 24 weeks after the first dose and 16 weeks after the second dose.  Zoster vaccine. One dose is recommended for adults aged 78 years or older unless certain conditions are present.  Measles, mumps, and rubella (MMR) vaccine. Adults born before [birth year] generally are considered immune to measles and mumps. Adults born in [birth year] or later should have 1 or more doses of MMR vaccine unless there is a contraindication to the vaccine or there is laboratory evidence of immunity to each of the three diseases. A routine second dose of MMR vaccine should be obtained at least 28 days after the first dose for students attending postsecondary schools, health care workers, or international travelers. People who received inactivated measles vaccine or an unknown type of measles vaccine during 1963-1967 should receive 2 doses of MMR vaccine. People who received inactivated mumps vaccine or an unknown type of mumps vaccine before 1979 and are at high risk for mumps infection should consider immunization with 2 doses of MMR vaccine. Unvaccinated health care workers born before [birth year] who lack laboratory evidence of measles, mumps, or rubella immunity or laboratory confirmation of disease should consider measles and mumps immunization with 2 doses of MMR vaccine or rubella immunization with 1 dose of MMR vaccine.  Pneumococcal 13-valent conjugate (PCV13) vaccine. When indicated, a person who is uncertain of his immunization history and has no record of immunization should receive the PCV13 vaccine. An adult aged 32 years or older who has certain medical conditions and has not been previously immunized should receive 1 dose of PCV13 vaccine. This PCV13 should be followed with a dose of pneumococcal polysaccharide (PPSV23) vaccine. The PPSV23 vaccine dose should be obtained at least 8 weeks after the dose of PCV13 vaccine. An adult aged 52 years or older who has  certain medical conditions and previously received 1 or more doses of PPSV23 vaccine should receive 1 dose of PCV13. The PCV13 vaccine dose should be obtained 1 or more years after the last PPSV23 vaccine dose.  Pneumococcal polysaccharide (PPSV23) vaccine. When PCV13 is also indicated, PCV13 should be obtained first. All adults aged 21 years and older should be immunized. An adult younger than age 57 years who has certain  medical conditions should be immunized. Any person who resides in a nursing home or long-term care facility should be immunized. An adult smoker should be immunized. People with an immunocompromised condition and certain other conditions should receive both PCV13 and PPSV23 vaccines. People with human immunodeficiency virus (HIV) infection should be immunized as soon as possible after diagnosis. Immunization during chemotherapy or radiation therapy should be avoided. Routine use of PPSV23 vaccine is not recommended for American Indians, [place] Natives, or people younger than 65 years unless there are medical conditions that require PPSV23 vaccine. When indicated, people who have unknown immunization and have no record of immunization should receive PPSV23 vaccine. One-time revaccination 5 years after the first dose of PPSV23 is recommended for people aged 19-64 years who have chronic kidney failure, nephrotic syndrome, asplenia, or immunocompromised conditions. People who received 1-2 doses of PPSV23 before age 6 years should receive another dose of PPSV23 vaccine at age 67 years or later if at least 5 years have passed since the previous dose. Doses of PPSV23 are not needed for people immunized with PPSV23 at or after age 74 years.  Meningococcal vaccine. Adults with asplenia or persistent complement component deficiencies should receive 2 doses of quadrivalent meningococcal conjugate (MenACWY-D) vaccine. The doses should be obtained at least 2 months apart. Microbiologists working with  certain meningococcal bacteria, [employer] recruits, people at risk during an outbreak, and people who travel to or live in countries with a high rate of meningitis should be immunized. A first-year college student up through age 17 years who is living in a residence hall should receive a dose if he did not receive a dose on or after his 16th birthday. Adults who have certain high-risk conditions should receive one or more doses of vaccine.  Hepatitis A vaccine. Adults who wish to be protected from this disease, have certain high-risk conditions, work with hepatitis A-infected animals, work in hepatitis A research labs, or travel to or work in countries with a high rate of hepatitis A should be immunized. Adults who were previously unvaccinated and who anticipate close contact with an international adoptee during the first 60 days after arrival in the [place] from a country with a high rate of hepatitis A should be immunized.  Hepatitis B vaccine. Adults should be immunized if they wish to be protected from this disease, have certain high-risk conditions, may be exposed to blood or other infectious body fluids, are household contacts or sex partners of hepatitis B positive people, are clients or workers in certain care facilities, or travel to or work in countries with a high rate of hepatitis B.  Haemophilus influenzae type b (Hib) vaccine. A previously unvaccinated person with asplenia or sickle cell disease or having a scheduled splenectomy should receive 1 dose of Hib vaccine. Regardless of previous immunization, a recipient of a hematopoietic stem cell transplant should receive a 3-dose series 6-12 months after his successful transplant. Hib vaccine is not recommended for adults with HIV infection. Preventive Service / Frequency Ages 9 to 81  Blood pressure check.** / Every 1 to 2 years.  Lipid and cholesterol check.** / Every 5 years beginning at age 75.  Hepatitis C blood test.** / For any  individual with known risks for hepatitis C.  Skin self-exam. / Monthly.  Influenza vaccine. / Every year.  Tetanus, diphtheria, and acellular pertussis (Tdap, Td) vaccine.** / Consult your health care provider. 1 dose of Td every 10 years.  Varicella vaccine.** / Consult your health care  provider.  HPV vaccine. / 3 doses over 6 months, if 51 or younger.  Measles, mumps, rubella (MMR) vaccine.** / You need at least 1 dose of MMR if you were born in 1957 or later. You may also need a second dose.  Pneumococcal 13-valent conjugate (PCV13) vaccine.** / Consult your health care provider.  Pneumococcal polysaccharide (PPSV23) vaccine.** / 1 to 2 doses if you smoke cigarettes or if you have certain conditions.  Meningococcal vaccine.** / 1 dose if you are age 42 to 54 years and a Market researcher living in a residence hall, or have one of several medical conditions. You may also need additional booster doses.  Hepatitis A vaccine.** / Consult your health care provider.  Hepatitis B vaccine.** / Consult your health care provider.  Haemophilus influenzae type b (Hib) vaccine.** / Consult your health care provider. Ages 43 to 66  Blood pressure check.** / Every 1 to 2 years.  Lipid and cholesterol check.** / Every 5 years beginning at age 75.  Lung cancer screening. / Every year if you are aged 80-80 years and have a 30-pack-year history of smoking and currently smoke or have quit within the past 15 years. Yearly screening is stopped once you have quit smoking for at least 15 years or develop a health problem that would prevent you from having lung cancer treatment.  Fecal occult blood test (FOBT) of stool. / Every year beginning at age 91 and continuing until age 57. You may not have to do this test if you get a colonoscopy every 10 years.  Flexible sigmoidoscopy** or colonoscopy.** / Every 5 years for a flexible sigmoidoscopy or every 10 years for a colonoscopy beginning at age  39 and continuing until age 60.  Hepatitis C blood test.** / For all people born from [birth year] through 1965 and any individual with known risks for hepatitis C.  Skin self-exam. / Monthly.  Influenza vaccine. / Every year.  Tetanus, diphtheria, and acellular pertussis (Tdap/Td) vaccine.** / Consult your health care provider. 1 dose of Td every 10 years.  Varicella vaccine.** / Consult your health care provider.  Zoster vaccine.** / 1 dose for adults aged 61 years or older.  Measles, mumps, rubella (MMR) vaccine.** / You need at least 1 dose of MMR if you were born in 1956 or later. You may also need a second dose.  Pneumococcal 13-valent conjugate (PCV13) vaccine.** / Consult your health care provider.  Pneumococcal polysaccharide (PPSV23) vaccine.** / 1 to 2 doses if you smoke cigarettes or if you have certain conditions.  Meningococcal vaccine.** / Consult your health care provider.  Hepatitis A vaccine.** / Consult your health care provider.  Hepatitis B vaccine.** / Consult your health care provider.  Haemophilus influenzae type b (Hib) vaccine.** / Consult your health care provider. Ages 10 and over  Blood pressure check.** / Every 1 to 2 years.  Lipid and cholesterol check.**/ Every 5 years beginning at age 37.  Lung cancer screening. / Every year if you are aged 54-80 years and have a 30-pack-year history of smoking and currently smoke or have quit within the past 15 years. Yearly screening is stopped once you have quit smoking for at least 15 years or develop a health problem that would prevent you from having lung cancer treatment.  Fecal occult blood test (FOBT) of stool. / Every year beginning at age 85 and continuing until age 27. You may not have to do this test if you get a colonoscopy every 10  years.  Flexible sigmoidoscopy** or colonoscopy.** / Every 5 years for a flexible sigmoidoscopy or every 10 years for a colonoscopy beginning at age 61 and continuing until age  66.  Hepatitis C blood test.** / For all people born from [birth year] through 1964 and any individual with known risks for hepatitis C.  Abdominal aortic aneurysm (AAA) screening.** / A one-time screening for ages 85 to 26 years who are current or former smokers.  Skin self-exam. / Monthly.  Influenza vaccine. / Every year.  Tetanus, diphtheria, and acellular pertussis (Tdap/Td) vaccine.** / 1 dose of Td every 10 years.  Varicella vaccine.** / Consult your health care provider.  Zoster vaccine.** / 1 dose for adults aged 42 years or older.  Pneumococcal 13-valent conjugate (PCV13) vaccine.** / Consult your health care provider.  Pneumococcal polysaccharide (PPSV23) vaccine.** / 1 dose for all adults aged 78 years and older.  Meningococcal vaccine.** / Consult your health care provider.  Hepatitis A vaccine.** / Consult your health care provider.  Hepatitis B vaccine.** / Consult your health care provider.  Haemophilus influenzae type b (Hib) vaccine.** / Consult your health care provider. **Family history and personal history of risk and conditions may change your health care provider's recommendations. Document Released: 04/03/2001 Document Revised: 02/10/2013 Document Reviewed: 07/03/2010 [facility] Patient Information 2015 [place], [place]. This information is not intended to replace advice given to you by your health care provider. Make sure you discuss any questions you have with your health care provider.

## 2013-12-24 NOTE — Progress Notes (Signed)
Pre visit review using our clinic review tool, if applicable. No additional management support is needed unless otherwise documented below in the visit note. 

## 2013-12-27 ENCOUNTER — Encounter: Payer: Self-pay | Admitting: Family Medicine

## 2013-12-27 DIAGNOSIS — Z0000 Encounter for general adult medical examination without abnormal findings: Secondary | ICD-10-CM | POA: Insufficient documentation

## 2013-12-27 DIAGNOSIS — T7840XA Allergy, unspecified, initial encounter: Secondary | ICD-10-CM

## 2013-12-27 HISTORY — DX: Allergy, unspecified, initial encounter: T78.40XA

## 2013-12-27 NOTE — Assessment & Plan Note (Signed)
Has been evaluated by cardiology and no concerns were identified

## 2013-12-27 NOTE — Assessment & Plan Note (Signed)
resolved 

## 2013-12-27 NOTE — Progress Notes (Signed)
Patient ID: Bradley Peterson, male   DOB: Feb 08, 1941, 73 y.o.   MRN: [id number] Bradley Peterson [id number] Jan 15, 1941 12/27/2013      Progress Note-Follow Up  Subjective  Chief Complaint  Chief Complaint  Patient presents with  . Annual Exam    physical  . Injections    flu and prevnar    HPI  Patient is a 73 year old male in today for routine medical care. Patient reports he is feeling well. Is having some trouble with congestion and difficulty breathing especially when he is lying flat, no fevers, chills or headache. No other acute c/o. Denies CP/palp/SOB/HA/congestion/fevers/GI or GU c/o. Taking meds as prescribed  Past Medical History  Diagnosis Date  . Arthritis of neck   . Positive TB test   . Annual physical exam 12/28/2012    Past Surgical History  Procedure Laterality Date  . Tonsillectomy      Late 66s    Family History  Problem Relation Age of Onset  . Heart disease Father     CABG at 23, died age 76  . Arthritis Father   . Arthritis Mother     Living 56  . Hypertension Mother   . Hyperlipidemia Mother     History   Social History  . Marital Status: Married    Spouse Name: N/A    Number of Children: 3  . Years of Education: N/A   Occupational History  . Not on file.   Social History Main Topics  . Smoking status: Former Smoker    Types: Pipe    Start date: 02/19/1977  . Smokeless tobacco: Not on file     Comment: >25 years ago, pipe only.  . Alcohol Use: Yes     Comment: Maybe 1/2 drink a week  . Drug Use: No  . Sexual Activity: Yes     Comment: lives with wife, retired from education, [place], no major dietary restrictions   Other Topics Concern  . Not on file   Social History Narrative   Retired Pharmacist, community.     Current Outpatient Prescriptions on File Prior to Visit  Medication Sig Dispense Refill  . aspirin 81 MG tablet Take 81 mg by mouth daily.    . Cholecalciferol (VITAMIN D3) 2000 UNITS capsule Take  2,000 Units by mouth daily.    . Ferrous Sulfate (IRON) 325 (65 FE) MG TABS Take 1 tablet by mouth. Taking every other day    . fluticasone (FLONASE) 50 MCG/ACT nasal spray Place 2 sprays into both nostrils daily as needed for allergies or rhinitis. 16 g 5  . NON FORMULARY as needed. OTC SINUS Medication.     No current facility-administered medications on file prior to visit.    No Known Allergies  Review of Systems  Review of Systems  Constitutional: Negative for fever, chills and malaise/fatigue.  HENT: Positive for congestion. Negative for hearing loss and nosebleeds.   Eyes: Negative for discharge.  Respiratory: Negative for cough, sputum production, shortness of breath and wheezing.   Cardiovascular: Negative for chest pain, palpitations and leg swelling.  Gastrointestinal: Negative for heartburn, nausea, vomiting, abdominal pain, diarrhea, constipation and blood in stool.  Genitourinary: Negative for dysuria, urgency, frequency and hematuria.  Musculoskeletal: Negative for myalgias, back pain and falls.  Skin: Negative for rash.  Neurological: Negative for dizziness, tremors, sensory change, focal weakness, loss of consciousness, weakness and headaches.  Endo/Heme/Allergies: Negative for polydipsia. Does not bruise/bleed easily.  Psychiatric/Behavioral: Negative  for depression and suicidal ideas. The patient is not nervous/anxious and does not have insomnia.     Objective  BP 109/61 mmHg  Pulse 62  Temp(Src) 98 F (36.7 C) (Oral)  Ht 5' 6.5" (1.689 m)  Wt 164 lb 3.2 oz (74.481 kg)  BMI 26.11 kg/m2  SpO2 95%  Physical Exam  Physical Exam  Constitutional: He is oriented to person, place, and time and well-developed, well-nourished, and in no distress. No distress.  HENT:  Head: Normocephalic and atraumatic.  Right nares very narrow, significant congestion noted. Deviated septum  Eyes: Conjunctivae are normal.  Neck: Neck supple. No thyromegaly present.   Cardiovascular: Normal rate, regular rhythm and normal heart sounds.   No murmur heard. Pulmonary/Chest: Effort normal and breath sounds normal. No respiratory distress.  Abdominal: He exhibits no distension and no mass. There is no tenderness.  Musculoskeletal: He exhibits no edema.  Neurological: He is alert and oriented to person, place, and time.  Skin: Skin is warm.  Psychiatric: Memory, affect and judgment normal.    Lab Results  Component Value Date   TSH 0.81 12/24/2013   Lab Results  Component Value Date   WBC 6.4 12/24/2013   HGB 14.3 12/24/2013   HCT 43.6 12/24/2013   MCV 93.3 12/24/2013   PLT 229.0 12/24/2013   Lab Results  Component Value Date   CREATININE 0.9 12/24/2013   BUN 20 12/24/2013   NA 142 12/24/2013   K 4.4 12/24/2013   CL 106 12/24/2013   CO2 27 12/24/2013   Lab Results  Component Value Date   ALT 21 12/24/2013   AST 25 12/24/2013   ALKPHOS 69 12/24/2013   BILITOT 0.7 12/24/2013   Lab Results  Component Value Date   CHOL 165 12/16/2012   Lab Results  Component Value Date   HDL 53 12/16/2012   Lab Results  Component Value Date   LDLCALC 93 12/16/2012   Lab Results  Component Value Date   TRIG 96 12/16/2012   Lab Results  Component Value Date   CHOLHDL 3.1 12/16/2012     Assessment & Plan  Anemia resolved  Abnormal echocardiogram Has been evaluated by cardiology and no concerns were identified  Medicare annual wellness visit, subsequent Patient denies any difficulties at home. No trouble with ADLs, depression or falls. No recent changes to vision or hearing. Is UTD with immunizations. Is UTD with screening. Discussed Advanced Directives, patient agrees to bring [place] copies of documents if can. Encouraged heart healthy diet, exercise as tolerated and adequate sleep. Given Prevnar today, flu shot today. Seen by [place] cardiology, Dr Percival Spanish Seen by [facility] in past. Reports colonoscopy is UTD, was in  2012, repeat in 5-10 years. Patient reportsTdap and PCV 23 are UTD.   Allergic state Patient is offered referral to ENT will consider for now will try nasal saline flushes, Flonase and antihistamines daily

## 2013-12-27 NOTE — Assessment & Plan Note (Addendum)
Patient denies any difficulties at home. No trouble with ADLs, depression or falls. No recent changes to vision or hearing. Is UTD with immunizations. Is UTD with screening. Discussed Advanced Directives, patient agrees to bring [place] copies of documents if can. Encouraged heart healthy diet, exercise as tolerated and adequate sleep. Given Prevnar today, flu shot today. Seen by [place] cardiology, Dr Percival Spanish Seen by [facility] in past. Reports colonoscopy is UTD, was in 2012, repeat in 5-10 years. Patient reportsTdap and PCV 23 are UTD.

## 2013-12-27 NOTE — Assessment & Plan Note (Signed)
Patient is offered referral to ENT will consider for now will try nasal saline flushes, Flonase and antihistamines daily

## 2014-01-26 ENCOUNTER — Ambulatory Visit: Payer: Medicare HMO

## 2014-07-28 ENCOUNTER — Ambulatory Visit (INDEPENDENT_AMBULATORY_CARE_PROVIDER_SITE_OTHER): Payer: Medicare HMO | Admitting: Physician Assistant

## 2014-07-28 ENCOUNTER — Encounter: Payer: Self-pay | Admitting: Physician Assistant

## 2014-07-28 VITALS — BP 122/74 | HR 58 | Temp 98.1°F | Resp 16 | Ht 66.5 in | Wt 165.5 lb

## 2014-07-28 DIAGNOSIS — W57XXXA Bitten or stung by nonvenomous insect and other nonvenomous arthropods, initial encounter: Secondary | ICD-10-CM | POA: Diagnosis not present

## 2014-07-28 DIAGNOSIS — T148 Other injury of unspecified body region: Secondary | ICD-10-CM

## 2014-07-28 MED ORDER — DOXYCYCLINE HYCLATE 100 MG PO CAPS: 100.0000 mg | ORAL_CAPSULE | Freq: Two times a day (BID) | ORAL | Status: DC

## 2014-07-28 NOTE — Assessment & Plan Note (Signed)
With cellulitis. Rx doxycycline twice a day 1 week. Supportive measures are reviewed with patient. Alarm signs and symptoms discussed with patient. He is to return immediately if any of these were to occur.

## 2014-07-28 NOTE — Progress Notes (Signed)
Patient presents to clinic today c/o tick bite removed from  Right thigh 5 days ago, now with redness, tenderness and surrounding rash. Denies fever, chills, malaise, myalgias or arthralgias. Successfully removed tick 5 days ago.  Past Medical History  Diagnosis Date  . Arthritis of neck   . Positive TB test   . Annual physical exam 12/28/2012  . Allergic state 12/27/2013    Current Outpatient Prescriptions on File Prior to Visit  Medication Sig Dispense Refill  . aspirin 81 MG tablet Take 81 mg by mouth every other day.     . Cholecalciferol (VITAMIN D3) 2000 UNITS capsule Take 2,000 Units by mouth daily.    . Ferrous Sulfate (IRON) 325 (65 FE) MG TABS Take 1 tablet by mouth daily. Taking every other day    . fluticasone (FLONASE) 50 MCG/ACT nasal spray Place 2 sprays into both nostrils daily as needed for allergies or rhinitis. 16 g 5  . montelukast (SINGULAIR) 10 MG tablet Take 1 tablet (10 mg total) by mouth at bedtime as needed. 30 tablet 6  . NON FORMULARY as needed. OTC SINUS Medication.     No current facility-administered medications on file prior to visit.    No Known Allergies  Family History  Problem Relation Age of Onset  . Heart disease Father     CABG at 33, died age 74  . Arthritis Father   . Arthritis Mother     Living 58  . Hypertension Mother   . Hyperlipidemia Mother     History   Social History  . Marital Status: Married    Spouse Name: N/A  . Number of Children: 3  . Years of Education: N/A   Social History Main Topics  . Smoking status: Former Smoker    Types: Pipe    Start date: 02/19/1977  . Smokeless tobacco: Not on file     Comment: >25 years ago, pipe only.  . Alcohol Use: Yes     Comment: Maybe 1/2 drink a week  . Drug Use: No  . Sexual Activity: Yes     Comment: lives with wife, retired from education, [place], no major dietary restrictions   Other Topics Concern  . None   Social History Narrative   Retired  Pharmacist, community.    Review of Systems - See HPI.  All other ROS are negative.  BP 122/74 mmHg  Pulse 58  Temp(Src) 98.1 F (36.7 C) (Oral)  Resp 16  Ht 5' 6.5" (1.689 m)  Wt 165 lb 8 oz (75.07 kg)  BMI 26.32 kg/m2  SpO2 99%  Physical Exam  Constitutional: He is oriented to person, place, and time and well-developed, well-nourished, and in no distress.  HENT:  Head: Normocephalic and atraumatic.  Eyes: Conjunctivae are normal.  Cardiovascular: Normal rate, regular rhythm, normal heart sounds and intact distal pulses.   Pulmonary/Chest: Effort normal and breath sounds normal. No respiratory distress. He has no wheezes. He has no rales. He exhibits no tenderness.  Neurological: He is alert and oriented to person, place, and time.  Skin: Skin is warm and dry.     Psychiatric: Affect normal.  Vitals reviewed.   No results found for this or any previous visit (from the past 2160 hour(s)).  Assessment/Plan: Tick bite With cellulitis. Rx doxycycline twice a day 1 week. Supportive measures are reviewed with patient. Alarm signs and symptoms discussed with patient. He is to return immediately if any of these were to occur.

## 2014-07-28 NOTE — Progress Notes (Signed)
Pre visit review using our clinic review tool, if applicable. No additional management support is needed unless otherwise documented below in the visit note/SLS  

## 2014-07-28 NOTE — Patient Instructions (Signed)
Please take antibiotic twice daily as directed. Do not take on an empty stomach. Keep area clean and dry. If rash spreads or you develop fever, please come see [place].  Have fun on your trip!

## 2014-09-21 ENCOUNTER — Telehealth: Payer: Self-pay | Admitting: *Deleted

## 2014-09-21 NOTE — Telephone Encounter (Signed)
Pt signed ROI received via fax from [email or web address]. Forwarded to [place] to scan/email to medical records. JG//CMA

## 2014-12-27 ENCOUNTER — Encounter: Payer: Self-pay | Admitting: Behavioral Health

## 2014-12-27 ENCOUNTER — Telehealth: Payer: Self-pay | Admitting: Behavioral Health

## 2014-12-27 NOTE — Telephone Encounter (Signed)
Pre-Visit Call completed with patient and chart updated.   Pre-Visit Info documented in Specialty Comments under [employer].    

## 2014-12-28 ENCOUNTER — Encounter: Payer: Self-pay | Admitting: Family Medicine

## 2014-12-28 ENCOUNTER — Ambulatory Visit (INDEPENDENT_AMBULATORY_CARE_PROVIDER_SITE_OTHER): Payer: Medicare HMO | Admitting: Family Medicine

## 2014-12-28 VITALS — BP 122/78 | HR 60 | Temp 97.9°F | Ht 68.0 in | Wt 166.0 lb

## 2014-12-28 DIAGNOSIS — M47812 Spondylosis without myelopathy or radiculopathy, cervical region: Secondary | ICD-10-CM

## 2014-12-28 DIAGNOSIS — R059 Cough, unspecified: Secondary | ICD-10-CM

## 2014-12-28 DIAGNOSIS — M542 Cervicalgia: Secondary | ICD-10-CM

## 2014-12-28 DIAGNOSIS — D649 Anemia, unspecified: Secondary | ICD-10-CM | POA: Diagnosis not present

## 2014-12-28 DIAGNOSIS — T7840XD Allergy, unspecified, subsequent encounter: Secondary | ICD-10-CM

## 2014-12-28 DIAGNOSIS — Z0000 Encounter for general adult medical examination without abnormal findings: Secondary | ICD-10-CM

## 2014-12-28 DIAGNOSIS — R05 Cough: Secondary | ICD-10-CM | POA: Diagnosis not present

## 2014-12-28 DIAGNOSIS — R0981 Nasal congestion: Secondary | ICD-10-CM | POA: Diagnosis not present

## 2014-12-28 DIAGNOSIS — Z23 Encounter for immunization: Secondary | ICD-10-CM

## 2014-12-28 LAB — CBC
HEMATOCRIT: 43.9 % (ref 39.0–52.0)
Hemoglobin: 14.7 g/dL (ref 13.0–17.0)
MCHC: 33.5 g/dL (ref 30.0–36.0)
MCV: 92.3 fl (ref 78.0–100.0)
Platelets: 240 10*3/uL (ref 150.0–400.0)
RBC: 4.75 Mil/uL (ref 4.22–5.81)
RDW: 13.4 % (ref 11.5–15.5)
WBC: 7.1 10*3/uL (ref 4.0–10.5)

## 2014-12-28 LAB — COMPREHENSIVE METABOLIC PANEL
ALBUMIN: 4.4 g/dL (ref 3.5–5.2)
ALT: 20 U/L (ref 0–53)
AST: 21 U/L (ref 0–37)
Alkaline Phosphatase: 69 U/L (ref 39–117)
BUN: 15 mg/dL (ref 6–23)
CALCIUM: 9.9 mg/dL (ref 8.4–10.5)
CHLORIDE: 105 meq/L (ref 96–112)
CO2: 27 mEq/L (ref 19–32)
Creatinine, Ser: 0.81 mg/dL (ref 0.40–1.50)
GFR: 99.22 mL/min (ref >60.00)
Glucose, Bld: 89 mg/dL (ref 70–99)
Potassium: 4.1 mEq/L (ref 3.5–5.1)
Sodium: 140 mEq/L (ref 135–145)
Total Bilirubin: 0.6 mg/dL (ref 0.2–1.2)
Total Protein: 7.3 g/dL (ref 6.0–8.3)

## 2014-12-28 LAB — TSH: TSH: 0.64 u[IU]/mL (ref 0.35–4.50)

## 2014-12-28 NOTE — Progress Notes (Signed)
Pre visit review using our clinic review tool, if applicable. No additional management support is needed unless otherwise documented below in the visit note. 

## 2014-12-28 NOTE — Patient Instructions (Signed)

## 2015-01-09 ENCOUNTER — Encounter: Payer: Self-pay | Admitting: Family Medicine

## 2015-01-09 NOTE — Assessment & Plan Note (Signed)
Patient denies any difficulties at home. No trouble with ADLs, depression or falls. See EMR for functional status screen and depression screen. No recent changes to vision or hearing. Is UTD with immunizations. Is UTD with screening. Discussed Advanced Directives. Encouraged heart healthy diet, exercise as tolerated and adequate sleep. See patient's problem list for health risk factors to monitor. See AVS for preventative healthcare recommendation schedule.   Immunization Status: Flu vaccine-- 12/24/13 Tdap-- Unknown; patient does not recall when this vaccine was given last PNA-- 12/24/13 Shingles-- 02/20/12; patient reported  A/P:  Changes to [place], PSH or Personal Hx: UTD CCS: 02/06/11 w/ Dr. Beather Arbour. Rosana Hoes; [facility]; normal; follow-up in 10 years; per the patient. PSA: 12/24/13 w/ Dr. Charlett Blake; 0.65; normal Bone Density: NA  Care Teams Updated: Dr. Lucianne Lei Adornetto - Dentistry (not able to find this provider under the care team/communications tab) Seen by [place] cardiology, Dr Percival Spanish Seen by [facility] in past. Reports colonoscopy was on 02/06/11 by Dr Ozella Almond ED/Hospital/Urgent Care Visits: Per the patient, no recent visits to the ED/Hospital or Urgent Care.

## 2015-01-09 NOTE — Assessment & Plan Note (Signed)
Resolved can discontinue supplements

## 2015-01-09 NOTE — Assessment & Plan Note (Signed)
Nasal congestion intermittently, encouraged nasal saline and Zyrtec prn.

## 2015-01-09 NOTE — Progress Notes (Signed)
Subjective:    Patient ID: Bradley Peterson, male    DOB: 1941-01-22, 74 y.o.   MRN: [id number]  Chief Complaint  Patient presents with  . Medicare Wellness    HPI Patient is in today for annual exam. Stooling fairly well. Continues to struggle with ongoing neck pain but does note that massage therapy and stretching helps. No new radicular symptoms. No recent illness. No acute concerns. Is doing well at home with activities of daily living. No self-care concerns. Denies any depression or anxiety. Is staying active and eating well. Denies CP/palp/SOB/HA/congestion/fevers/GI or GU c/o. Taking meds as prescribed  Past Medical History  Diagnosis Date  . Arthritis of neck ([place])   . Positive TB test   . Annual physical exam 12/28/2012  . Allergic state 12/27/2013    Past Surgical History  Procedure Laterality Date  . Tonsillectomy      Late 41s    Family History  Problem Relation Age of Onset  . Heart disease Father     CABG at 60, died age 65  . Arthritis Father   . Arthritis Mother     Living 43  . Hypertension Mother   . Hyperlipidemia Mother   . Drug abuse Son     Social History   Social History  . Marital Status: Married    Spouse Name: N/A  . Number of Children: 3  . Years of Education: N/A   Occupational History  . Not on file.   Social History Main Topics  . Smoking status: Former Smoker    Types: Pipe    Start date: 02/19/1977  . Smokeless tobacco: Not on file     Comment: >25 years ago, pipe only.  . Alcohol Use: Yes     Comment: Maybe 1/2 drink a week  . Drug Use: No  . Sexual Activity: Yes     Comment: lives with wife, retired from education, [place], no major dietary restrictions   Other Topics Concern  . Not on file   Social History Narrative   Retired Pharmacist, community.     Outpatient Prescriptions Prior to Visit  Medication Sig Dispense Refill  . aspirin 81 MG tablet Take 81 mg by mouth every other day.     .  Cholecalciferol (VITAMIN D3) 2000 UNITS capsule Take 2,000 Units by mouth daily.    . NON FORMULARY as needed. OTC SINUS Medication.    . Ferrous Sulfate (IRON) 325 (65 FE) MG TABS Take 1 tablet by mouth daily. Taking every other day    . montelukast (SINGULAIR) 10 MG tablet Take 1 tablet (10 mg total) by mouth at bedtime as needed. 30 tablet 6   No facility-administered medications prior to visit.    No Known Allergies  Review of Systems  Constitutional: Negative for fever and malaise/fatigue.  HENT: Negative for congestion.   Eyes: Negative for discharge.  Respiratory: Negative for shortness of breath.   Cardiovascular: Negative for chest pain, palpitations and leg swelling.  Gastrointestinal: Negative for nausea and abdominal pain.  Genitourinary: Negative for dysuria.  Musculoskeletal: Negative for falls.  Skin: Negative for rash.  Neurological: Negative for loss of consciousness and headaches.  Endo/Heme/Allergies: Negative for environmental allergies.  Psychiatric/Behavioral: Negative for depression. The patient is not nervous/anxious.        Objective:    Physical Exam  Constitutional: He is oriented to person, place, and time. He appears well-developed and well-nourished. No distress.  HENT:  Head: Normocephalic and  atraumatic.  Nose: Nose normal.  Eyes: Right eye exhibits no discharge. Left eye exhibits no discharge.  Neck: Normal range of motion. Neck supple.  Cardiovascular: Normal rate and regular rhythm.   No murmur heard. Pulmonary/Chest: Effort normal and breath sounds normal.  Abdominal: Soft. Bowel sounds are normal. There is no tenderness.  Musculoskeletal: He exhibits no edema.  Neurological: He is alert and oriented to person, place, and time.  Skin: Skin is warm and dry.  Psychiatric: He has a normal mood and affect.  Nursing note and vitals reviewed.   BP 122/78 mmHg  Pulse 60  Temp(Src) 97.9 F (36.6 C) (Oral)  Ht 5\' 8"  (1.727 m)  Wt 166 lb  (75.297 kg)  BMI 25.25 kg/m2  SpO2 95% Wt Readings from Last 3 Encounters:  12/28/14 166 lb (75.297 kg)  07/28/14 165 lb 8 oz (75.07 kg)  12/24/13 164 lb 3.2 oz (74.481 kg)     Lab Results  Component Value Date   WBC 7.1 12/28/2014   HGB 14.7 12/28/2014   HCT 43.9 12/28/2014   PLT 240.0 12/28/2014   GLUCOSE 89 12/28/2014   CHOL 165 12/16/2012   TRIG 96 12/16/2012   HDL 53 12/16/2012   LDLCALC 93 12/16/2012   ALT 20 12/28/2014   AST 21 12/28/2014   NA 140 12/28/2014   K 4.1 12/28/2014   CL 105 12/28/2014   CREATININE 0.81 12/28/2014   BUN 15 12/28/2014   CO2 27 12/28/2014   TSH 0.64 12/28/2014   PSA 0.65 12/24/2013    Lab Results  Component Value Date   TSH 0.64 12/28/2014   Lab Results  Component Value Date   WBC 7.1 12/28/2014   HGB 14.7 12/28/2014   HCT 43.9 12/28/2014   MCV 92.3 12/28/2014   PLT 240.0 12/28/2014   Lab Results  Component Value Date   NA 140 12/28/2014   K 4.1 12/28/2014   CO2 27 12/28/2014   GLUCOSE 89 12/28/2014   BUN 15 12/28/2014   CREATININE 0.81 12/28/2014   BILITOT 0.6 12/28/2014   ALKPHOS 69 12/28/2014   AST 21 12/28/2014   ALT 20 12/28/2014   PROT 7.3 12/28/2014   ALBUMIN 4.4 12/28/2014   CALCIUM 9.9 12/28/2014   GFR 99.22 12/28/2014   Lab Results  Component Value Date   CHOL 165 12/16/2012   Lab Results  Component Value Date   HDL 53 12/16/2012   Lab Results  Component Value Date   LDLCALC 93 12/16/2012   Lab Results  Component Value Date   TRIG 96 12/16/2012   Lab Results  Component Value Date   CHOLHDL 3.1 12/16/2012   No results found for: HGBA1C     Assessment & Plan:   Problem List Items Addressed This Visit    Allergic state    Nasal congestion intermittently, encouraged nasal saline and Zyrtec prn.      Anemia    Resolved can discontinue supplements      Relevant Orders   TSH (Completed)   CBC (Completed)   Comprehensive metabolic panel (Completed)   Medicare annual wellness visit,  subsequent - Primary    Patient denies any difficulties at home. No trouble with ADLs, depression or falls. See EMR for functional status screen and depression screen. No recent changes to vision or hearing. Is UTD with immunizations. Is UTD with screening. Discussed Advanced Directives. Encouraged heart healthy diet, exercise as tolerated and adequate sleep. See patient's problem list for health risk factors to monitor. See  AVS for preventative healthcare recommendation schedule.   Immunization Status: Flu vaccine-- 12/24/13 Tdap-- Unknown; patient does not recall when this vaccine was given last PNA-- 12/24/13 Shingles-- 02/20/12; patient reported  A/P:  Changes to [place], PSH or Personal Hx: UTD CCS: 02/06/11 w/ Dr. Beather Arbour. Rosana Hoes; [facility]; normal; follow-up in 10 years; per the patient. PSA: 12/24/13 w/ Dr. Charlett Blake; 0.65; normal Bone Density: NA  Care Teams Updated: Dr. Lucianne Lei Adornetto - Dentistry (not able to find this provider under the care team/communications tab) Seen by [place] cardiology, Dr Percival Spanish Seen by [facility] in past. Reports colonoscopy was on 02/06/11 by Dr Ozella Almond ED/Hospital/Urgent Care Visits: Per the patient, no recent visits to the ED/Hospital or Urgent Care.      Osteoarthritis of neck    Confirmed by imaging. Encouraged moist heat and gentle stretching as tolerated. May try NSAIDs and prescription meds as directed and report if symptoms worsen or seek immediate care       Other Visit Diagnoses    Encounter for immunization        Cough        Relevant Orders    TSH (Completed)    CBC (Completed)    Comprehensive metabolic panel (Completed)    Nasal congestion        Relevant Orders    TSH (Completed)    CBC (Completed)    Comprehensive metabolic panel (Completed)    Neck pain        Relevant Orders    TSH (Completed)    CBC (Completed)    Comprehensive metabolic panel (Completed)    Need for  diphtheria-tetanus-pertussis (Tdap) vaccine, adult/adolescent        Relevant Orders    Tdap vaccine greater than or equal to 7yo IM (Completed)       I have discontinued Mr. Borger's Iron and montelukast. I am also having him maintain his Vitamin D3, NON FORMULARY, and aspirin.  No orders of the defined types were placed in this encounter.     Penni Homans, MD

## 2015-01-09 NOTE — Assessment & Plan Note (Signed)
Confirmed by imaging. Encouraged moist heat and gentle stretching as tolerated. May try NSAIDs and prescription meds as directed and report if symptoms worsen or seek immediate care

## 2015-04-14 ENCOUNTER — Ambulatory Visit (INDEPENDENT_AMBULATORY_CARE_PROVIDER_SITE_OTHER): Payer: Medicare HMO | Admitting: Medical

## 2015-04-14 ENCOUNTER — Encounter: Payer: Self-pay | Admitting: Medical

## 2015-04-14 VITALS — BP 110/78 | HR 68 | Temp 97.8°F | Ht 68.0 in | Wt 172.2 lb

## 2015-04-14 DIAGNOSIS — J309 Allergic rhinitis, unspecified: Secondary | ICD-10-CM | POA: Diagnosis not present

## 2015-04-14 MED ORDER — LEVOCETIRIZINE DIHYDROCHLORIDE 5 MG PO TABS: 5.0000 mg | ORAL_TABLET | Freq: Every evening | ORAL | Status: DC

## 2015-04-14 MED ORDER — FLUTICASONE PROPIONATE 50 MCG/ACT NA SUSP: 2.0000 | Freq: Every day | NASAL | Status: DC

## 2015-04-14 NOTE — Progress Notes (Signed)
Subjective:    Patient ID: Bradley Peterson, male    DOB: 04/21/1940, 75 y.o.   MRN: [id number]  HPI  Pt in for some stuffy nose, nasal congestion, no sneezing, and dry cough. Symptoms for one week. Usually get spring allergies.   No fever, no chills , or sweats.      Review of Systems  Constitutional: Negative for fever, chills and fatigue.  HENT: Positive for congestion, postnasal drip and rhinorrhea. Negative for ear pain and sneezing.   Respiratory: Negative for cough, chest tightness, shortness of breath and wheezing.   Cardiovascular: Negative for chest pain and palpitations.  Gastrointestinal: Negative for abdominal pain.  Neurological: Negative for dizziness and headaches.  Hematological: Negative for adenopathy. Does not bruise/bleed easily.    Past Medical History  Diagnosis Date  . 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 Positive TB test   . Annual physical exam 12/28/2012  . Allergic state 12/27/2013    Social History   Social History  . Marital Status: Married    Spouse Name: N/A  . Number of Children: 3  . Years of Education: N/A   Occupational History  . Not on file.   Social History Main Topics  . Smoking status: Former Smoker    Types: Pipe    Start date: 02/19/1977  . Smokeless tobacco: Not on file     Comment: >25 years ago, pipe only.  . Alcohol Use: Yes     Comment: Maybe 1/2 drink a week  . Drug Use: No  . Sexual Activity: Yes     Comment: lives with wife, retired from education, [place], no major dietary restrictions   Other Topics Concern  . Not on file   Social History Narrative   Retired Pharmacist, community.     Past Surgical History  Procedure Laterality Date  . Tonsillectomy      Late 100s    Family History  Problem Relation Age of Onset  . Heart disease Father     CABG at 84, died age 40  . Arthritis Father   . Arthritis Mother     Living 69  . Hypertension Mother   . Hyperlipidemia Mother   . Drug abuse Son      No Known Allergies  Current Outpatient Prescriptions on File Prior to Visit  Medication Sig Dispense Refill  . aspirin 81 MG tablet Take 81 mg by mouth every other day.     . Cholecalciferol (VITAMIN D3) 2000 UNITS capsule Take 2,000 Units by mouth daily.    . NON FORMULARY as needed. OTC SINUS Medication.     No current facility-administered medications on file prior to visit.    BP 110/78 mmHg  Pulse 68  Temp(Src) 97.8 F (36.6 C) (Oral)  Ht 5\' 8"  (1.727 m)  Wt 172 lb 3.2 oz (78.109 kg)  BMI 26.19 kg/m2  SpO2 98%       Objective:   Physical Exam  General  Mental Status - Alert. General Appearance - Well groomed. Not in acute distress.  Skin Rashes- No Rashes.  HEENT Head- Normal. Ear Auditory Canal - Left- Normal. Right - Normal.Tympanic Membrane- Left- Normal. Right- Normal. Eye Sclera/Conjunctiva- Left- Normal. Right- Normal. Nose & Sinuses Nasal Mucosa- Left-  Boggy and Congested. Right-  Boggy and  Congested.Bilateral no  maxillary and  No frontal sinus pressure. Mouth & Throat Lips: Upper Lip- Normal: no dryness, cracking, pallor, cyanosis, or vesicular eruption. Lower Lip-Normal: no dryness, cracking,  pallor, cyanosis or vesicular eruption. Buccal Mucosa- Bilateral- No Aphthous ulcers. Oropharynx- No Discharge or Erythema. Tonsils: Characteristics- Bilateral- No Erythema or Congestion. Size/Enlargement- Bilateral- No enlargement. Discharge- bilateral-None.  Neck Neck- Supple. No Masses.   Chest and Lung Exam Auscultation: Breath Sounds:-Clear even and unlabored.  Cardiovascular Auscultation:Rythm- Regular, rate and rhythm. Murmurs & Other Heart Sounds:Ausculatation of the heart reveal- No Murmurs.  Lymphatic Head & Neck General Head & Neck Lymphatics: Bilateral: Description- No Localized lymphadenopathy.       Assessment & Plan:  For your allergies rx flonase nasal spray and xyzal. Will give refill for upcoming spring.  Follow up as  regularly scheduled with pcp or as needed  I did mention to pt if within next 5 days sinus pressure/infection type symptoms were to occur call us and could give antibiotic. But past that he would need to come in.

## 2015-04-14 NOTE — Patient Instructions (Signed)
For your allergies rx flonase nasal spray and xyzal. Will give refill for upcoming spring.  Follow up as regularly scheduled with pcp or as needed

## 2015-04-14 NOTE — Progress Notes (Signed)
Pre visit review using our clinic review tool, if applicable. No additional management support is needed unless otherwise documented below in the visit note. 

## 2015-09-15 ENCOUNTER — Encounter: Payer: Self-pay | Admitting: Medical

## 2015-09-15 ENCOUNTER — Ambulatory Visit (INDEPENDENT_AMBULATORY_CARE_PROVIDER_SITE_OTHER): Payer: Medicare HMO | Admitting: Medical

## 2015-09-15 VITALS — BP 110/76 | HR 77 | Temp 98.3°F | Ht 68.0 in | Wt 166.2 lb

## 2015-09-15 DIAGNOSIS — L989 Disorder of the skin and subcutaneous tissue, unspecified: Secondary | ICD-10-CM | POA: Diagnosis not present

## 2015-09-15 NOTE — Progress Notes (Signed)
Pre visit review using our clinic review tool, if applicable. No additional management support is needed unless otherwise documented below in the visit note. 

## 2015-09-15 NOTE — Progress Notes (Addendum)
Subjective:    Patient ID: Bradley Peterson, male    DOB: 1941-02-16, 75 y.o.   MRN: [id number]  HPI   Pt in for evaluation.   Pt has hypopigmented, dry and scaly area for 2 months. Area is increasing in size. Tiny at onset and increasing rapidly as well.  Also small red area in left eye brow area and small area in front of tragus.  Pt states Dr. Charlett Blake  mentioned sending to dermatologist.    Review of Systems  Constitutional: Negative for chills, fatigue and fever.  Respiratory: Negative for cough, chest tightness, shortness of breath and wheezing.   Cardiovascular: Negative for chest pain and palpitations.  Skin:       Lesion and rash see hpi.  Neurological: Negative for dizziness.  Hematological: Negative for adenopathy. Does not bruise/bleed easily.  Psychiatric/Behavioral: Negative for behavioral problems and confusion.    Past Medical History:  Diagnosis Date  . Allergic state 12/27/2013  . Annual physical exam 12/28/2012  . 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 Positive TB test      Social History   Social History  . Marital status: Married    Spouse name: N/A  . Number of children: 3  . Years of education: N/A   Occupational History  . Not on file.   Social History Main Topics  . Smoking status: Former Smoker    Types: Pipe    Start date: 02/19/1977  . Smokeless tobacco: Not on file     Comment: >25 years ago, pipe only.  . Alcohol use Yes     Comment: Maybe 1/2 drink a week  . Drug use: No  . Sexual activity: Yes     Comment: lives with wife, retired from education, [place], no major dietary restrictions   Other Topics Concern  . Not on file   Social History Narrative   Retired Pharmacist, community.     Past Surgical History:  Procedure Laterality Date  . TONSILLECTOMY     Late 80s    Family History  Problem Relation Age of Onset  . Heart disease Father     CABG at 26, died age 72  . Arthritis Father   . Arthritis Mother    Living 33  . Hypertension Mother   . Hyperlipidemia Mother   . Drug abuse Son     No Known Allergies  Current Outpatient Prescriptions on File Prior to Visit  Medication Sig Dispense Refill  . aspirin 81 MG tablet Take 81 mg by mouth every other day.     . Cholecalciferol (VITAMIN D3) 2000 UNITS capsule Take 2,000 Units by mouth daily.    . Ferrous Sulfate (IRON) 90 (18 Fe) MG TABS Take 1 capsule by mouth.    . fluticasone (FLONASE) 50 MCG/ACT nasal spray Place 2 sprays into both nostrils daily. 16 g 3  . levocetirizine (XYZAL) 5 MG tablet Take 1 tablet (5 mg total) by mouth every evening. 30 tablet 3  . NON FORMULARY as needed. OTC SINUS Medication.     No current facility-administered medications on file prior to visit.     BP 110/76 (BP Location: Right Arm, Patient Position: Sitting, Cuff Size: Normal)   Pulse 77   Temp 98.3 F (36.8 C) (Oral)   Ht 5\' 8"  (1.727 m)   Wt 166 lb 3.2 oz (75.4 kg)   SpO2 98%   BMI 25.27 kg/m       Objective:   Physical  Exam  General- No acute distress. Pleasant patient.  Lungs- Clear, even and unlabored. Heart- regular rate and rhythm. Neurologic- CNII- XII grossly intact.  Skin- Left forearm- hyopigmented, scaly , dry area 1 inch by .5 inch. Left eye brow- small red raised area. Left preauricular area small red raised area.       Assessment & Plan:  I did go ahead and make a referral to dermatologist. I asked to try to get you in within 1-2 weeks. If a week passes and no call then can call here and ask to speak to Bradley Peterson. She could investigate and give you info on referral.  Follow up as regularly scheduled with pcp or as needed  Bradley Peterson, Percell Miller, [employer]

## 2015-09-15 NOTE — Patient Instructions (Addendum)
I did go ahead and make a referral to dermalogist. I asked to try to get you in within 1-2 weeks. If a week passes and no call then can call here and ask to speak to Rutledge. She could investigate and give you info on referral.  Follow up as regularly scheduled with pcp or as needed

## 2015-09-16 ENCOUNTER — Encounter: Payer: Self-pay | Admitting: Family Medicine

## 2015-10-25 DIAGNOSIS — L570 Actinic keratosis: Secondary | ICD-10-CM | POA: Diagnosis not present

## 2015-10-25 DIAGNOSIS — D492 Neoplasm of unspecified behavior of bone, soft tissue, and skin: Secondary | ICD-10-CM | POA: Diagnosis not present

## 2015-10-25 DIAGNOSIS — L821 Other seborrheic keratosis: Secondary | ICD-10-CM | POA: Diagnosis not present

## 2015-10-27 DIAGNOSIS — H259 Unspecified age-related cataract: Secondary | ICD-10-CM | POA: Diagnosis not present

## 2015-10-27 DIAGNOSIS — D3132 Benign neoplasm of left choroid: Secondary | ICD-10-CM | POA: Diagnosis not present

## 2015-10-27 DIAGNOSIS — H04123 Dry eye syndrome of bilateral lacrimal glands: Secondary | ICD-10-CM | POA: Diagnosis not present

## 2015-11-25 DIAGNOSIS — L570 Actinic keratosis: Secondary | ICD-10-CM | POA: Diagnosis not present

## 2015-12-30 ENCOUNTER — Encounter: Payer: Self-pay | Admitting: Family Medicine

## 2015-12-30 ENCOUNTER — Ambulatory Visit (INDEPENDENT_AMBULATORY_CARE_PROVIDER_SITE_OTHER): Payer: Medicare HMO | Admitting: Family Medicine

## 2015-12-30 VITALS — BP 108/68 | HR 53 | Temp 97.8°F | Ht 68.0 in | Wt 169.2 lb

## 2015-12-30 DIAGNOSIS — R0981 Nasal congestion: Secondary | ICD-10-CM

## 2015-12-30 DIAGNOSIS — T7840XD Allergy, unspecified, subsequent encounter: Secondary | ICD-10-CM

## 2015-12-30 DIAGNOSIS — Z23 Encounter for immunization: Secondary | ICD-10-CM

## 2015-12-30 DIAGNOSIS — D649 Anemia, unspecified: Secondary | ICD-10-CM | POA: Diagnosis not present

## 2015-12-30 DIAGNOSIS — Z0000 Encounter for general adult medical examination without abnormal findings: Secondary | ICD-10-CM

## 2015-12-30 LAB — CBC
HEMATOCRIT: 42.1 % (ref 39.0–52.0)
HEMOGLOBIN: 14.3 g/dL (ref 13.0–17.0)
MCHC: 34 g/dL (ref 30.0–36.0)
MCV: 91.7 fl (ref 78.0–100.0)
PLATELETS: 264 10*3/uL (ref 150.0–400.0)
RBC: 4.59 Mil/uL (ref 4.22–5.81)
RDW: 14.1 % (ref 11.5–15.5)
WBC: 5.9 10*3/uL (ref 4.0–10.5)

## 2015-12-30 LAB — COMPREHENSIVE METABOLIC PANEL
ALBUMIN: 4.3 g/dL (ref 3.5–5.2)
ALK PHOS: 68 U/L (ref 39–117)
ALT: 19 U/L (ref 0–53)
AST: 21 U/L (ref 0–37)
BUN: 17 mg/dL (ref 6–23)
CALCIUM: 9.9 mg/dL (ref 8.4–10.5)
CHLORIDE: 105 meq/L (ref 96–112)
CO2: 29 mEq/L (ref 19–32)
Creatinine, Ser: 0.87 mg/dL (ref 0.40–1.50)
GFR: 91.11 mL/min (ref >60.00)
Glucose, Bld: 91 mg/dL (ref 70–99)
Potassium: 4.5 mEq/L (ref 3.5–5.1)
Sodium: 141 mEq/L (ref 135–145)
Total Bilirubin: 0.6 mg/dL (ref 0.2–1.2)
Total Protein: 7.1 g/dL (ref 6.0–8.3)

## 2015-12-30 NOTE — Progress Notes (Signed)
Pre visit review using our clinic review tool, if applicable. No additional management support is needed unless otherwise documented below in the visit note. 

## 2015-12-30 NOTE — Patient Instructions (Signed)
[employer] 10 strain probiotic cap daily [place] for Adults, Male A healthy lifestyle and preventive care can promote health and wellness. Preventive health guidelines for men include the following key practices:  A routine yearly physical is a good way to check with your health care provider about your health and preventative screening. It is a chance to share any concerns and updates on your health and to receive a thorough exam.  Visit your dentist for a routine exam and preventative care every 6 months. Brush your teeth twice a day and floss once a day. Good oral hygiene prevents tooth decay and gum disease.  The frequency of eye exams is based on your age, health, family medical history, use of contact lenses, and other factors. Follow your health care provider's recommendations for frequency of eye exams.  Eat a healthy diet. Foods such as vegetables, fruits, whole grains, low-fat dairy products, and lean protein foods contain the nutrients you need without too many calories. Decrease your intake of foods high in solid fats, added sugars, and salt. Eat the right amount of calories for you.Get information about a proper diet from your health care provider, if necessary.  Regular physical exercise is one of the most important things you can do for your health. Most adults should get at least 150 minutes of moderate-intensity exercise (any activity that increases your heart rate and causes you to sweat) each week. In addition, most adults need muscle-strengthening exercises on 2 or more days a week.  Maintain a healthy weight. The body mass index (BMI) is a screening tool to identify possible weight problems. It provides an estimate of body fat based on height and weight. Your health care provider can find your BMI and can help you achieve or maintain a healthy weight.For adults 20 years and older:  A BMI below 18.5 is considered underweight.  A BMI of 18.5 to 24.9 is  normal.  A BMI of 25 to 29.9 is considered overweight.  A BMI of 30 and above is considered obese.  Maintain normal blood lipids and cholesterol levels by exercising and minimizing your intake of saturated fat. Eat a balanced diet with plenty of fruit and vegetables. Blood tests for lipids and cholesterol should begin at age 60 and be repeated every 5 years. If your lipid or cholesterol levels are high, you are over 50, or you are at high risk for heart disease, you may need your cholesterol levels checked more frequently.Ongoing high lipid and cholesterol levels should be treated with medicines if diet and exercise are not working.  If you smoke, find out from your health care provider how to quit. If you do not use tobacco, do not start.  Lung cancer screening is recommended for adults aged 90-80 years who are at high risk for developing lung cancer because of a history of smoking. A yearly low-dose CT scan of the lungs is recommended for people who have at least a 30-pack-year history of smoking and are a current smoker or have quit within the past 15 years. A pack year of smoking is smoking an average of 1 pack of cigarettes a day for 1 year (for example: 1 pack a day for 30 years or 2 packs a day for 15 years). Yearly screening should continue until the smoker has stopped smoking for at least 15 years. Yearly screening should be stopped for people who develop a health problem that would prevent them from having lung cancer treatment.  If you choose to drink alcohol, do not have more than 2 drinks per day. One drink is considered to be 12 ounces (355 mL) of beer, 5 ounces (148 mL) of wine, or 1.5 ounces (44 mL) of liquor.  Avoid use of street drugs. Do not share needles with anyone. Ask for help if you need support or instructions about stopping the use of drugs.  High blood pressure causes heart disease and increases the risk of stroke. Your blood pressure should be checked at least every 1-2  years. Ongoing high blood pressure should be treated with medicines, if weight loss and exercise are not effective.  If you are 45-65 years old, ask your health care provider if you should take aspirin to prevent heart disease.  Diabetes screening is done by taking a blood sample to check your blood glucose level after you have not eaten for a certain period of time (fasting). If you are not overweight and you do not have risk factors for diabetes, you should be screened once every 3 years starting at age 24. If you are overweight or obese and you are 1-64 years of age, you should be screened for diabetes every year as part of your cardiovascular risk assessment.  Colorectal cancer can be detected and often prevented. Most routine colorectal cancer screening begins at the age of 72 and continues through age 30. However, your health care provider may recommend screening at an earlier age if you have risk factors for colon cancer. On a yearly basis, your health care provider may provide home test kits to check for hidden blood in the stool. Use of a small camera at the end of a tube to directly examine the colon (sigmoidoscopy or colonoscopy) can detect the earliest forms of colorectal cancer. Talk to your health care provider about this at age 81, when routine screening begins. Direct exam of the colon should be repeated every 5-10 years through age 62, unless early forms of precancerous polyps or small growths are found.  People who are at an increased risk for hepatitis B should be screened for this virus. You are considered at high risk for hepatitis B if:  You were born in a country where hepatitis B occurs often. Talk with your health care provider about which countries are considered high risk.  Your parents were born in a high-risk country and you have not received a shot to protect against hepatitis B (hepatitis B vaccine).  You have HIV or AIDS.  You use needles to inject street  drugs.  You live with, or have sex with, someone who has hepatitis B.  You are a man who has sex with other men (MSM).  You get hemodialysis treatment.  You take certain medicines for conditions such as cancer, organ transplantation, and autoimmune conditions.  Hepatitis C blood testing is recommended for all people born from [birth year] through 1965 and any individual with known risks for hepatitis C.  Practice safe sex. Use condoms and avoid high-risk sexual practices to reduce the spread of sexually transmitted infections (STIs). STIs include gonorrhea, chlamydia, syphilis, trichomonas, herpes, HPV, and human immunodeficiency virus (HIV). Herpes, HIV, and HPV are viral illnesses that have no cure. They can result in disability, cancer, and death.  If you are a man who has sex with other men, you should be screened at least once per year for:  HIV.  Urethral, rectal, and pharyngeal infection of gonorrhea, chlamydia, or both.  If you are at risk of being  infected with HIV, it is recommended that you take a prescription medicine daily to prevent HIV infection. This is called preexposure prophylaxis (PrEP). You are considered at risk if:  You are a man who has sex with other men (MSM) and have other risk factors.  You are a heterosexual man, are sexually active, and are at increased risk for HIV infection.  You take drugs by injection.  You are sexually active with a partner who has HIV.  Talk with your health care provider about whether you are at high risk of being infected with HIV. If you choose to begin PrEP, you should first be tested for HIV. You should then be tested every 3 months for as long as you are taking PrEP.  A one-time screening for abdominal aortic aneurysm (AAA) and surgical repair of large AAAs by ultrasound are recommended for men ages 65 to 75 years who are current or former smokers.  Healthy men should no longer receive prostate-specific antigen (PSA) blood tests as  part of routine cancer screening. Talk with your health care provider about prostate cancer screening.  Testicular cancer screening is not recommended for adult males who have no symptoms. Screening includes self-exam, a health care provider exam, and other screening tests. Consult with your health care provider about any symptoms you have or any concerns you have about testicular cancer.  Use sunscreen. Apply sunscreen liberally and repeatedly throughout the day. You should seek shade when your shadow is shorter than you. Protect yourself by wearing long sleeves, pants, a wide-brimmed hat, and sunglasses year round, whenever you are outdoors.  Once a month, do a whole-body skin exam, using a mirror to look at the skin on your back. Tell your health care provider about new moles, moles that have irregular borders, moles that are larger than a pencil eraser, or moles that have changed in shape or color.  Stay current with required vaccines (immunizations).  Influenza vaccine. All adults should be immunized every year.  Tetanus, diphtheria, and acellular pertussis (Td, Tdap) vaccine. An adult who has not previously received Tdap or who does not know his vaccine status should receive 1 dose of Tdap. This initial dose should be followed by tetanus and diphtheria toxoids (Td) booster doses every 10 years. Adults with an unknown or incomplete history of completing a 3-dose immunization series with Td-containing vaccines should begin or complete a primary immunization series including a Tdap dose. Adults should receive a Td booster every 10 years.  Varicella vaccine. An adult without evidence of immunity to varicella should receive 2 doses or a second dose if he has previously received 1 dose.  Human papillomavirus (HPV) vaccine. Males aged 11-21 years who have not received the vaccine previously should receive the 3-dose series. Males aged 22-26 years may be immunized. Immunization is recommended through  the age of 26 years for any male who has sex with males and did not get any or all doses earlier. Immunization is recommended for any person with an immunocompromised condition through the age of 26 years if he did not get any or all doses earlier. During the 3-dose series, the second dose should be obtained 4-8 weeks after the first dose. The third dose should be obtained 24 weeks after the first dose and 16 weeks after the second dose.  Zoster vaccine. One dose is recommended for adults aged 60 years or older unless certain conditions are present.  Measles, mumps, and rubella (MMR) vaccine. Adults born before 1957 generally are   considered immune to measles and mumps. Adults born in 1957 or later should have 1 or more doses of MMR vaccine unless there is a contraindication to the vaccine or there is laboratory evidence of immunity to each of the three diseases. A routine second dose of MMR vaccine should be obtained at least 28 days after the first dose for students attending postsecondary schools, health care workers, or international travelers. People who received inactivated measles vaccine or an unknown type of measles vaccine during 1963-1967 should receive 2 doses of MMR vaccine. People who received inactivated mumps vaccine or an unknown type of mumps vaccine before 1979 and are at high risk for mumps infection should consider immunization with 2 doses of MMR vaccine. Unvaccinated health care workers born before 1957 who lack laboratory evidence of measles, mumps, or rubella immunity or laboratory confirmation of disease should consider measles and mumps immunization with 2 doses of MMR vaccine or rubella immunization with 1 dose of MMR vaccine.  Pneumococcal 13-valent conjugate (PCV13) vaccine. When indicated, a person who is uncertain of his immunization history and has no record of immunization should receive the PCV13 vaccine. All adults 65 years of age and older should receive this vaccine. An  adult aged 19 years or older who has certain medical conditions and has not been previously immunized should receive 1 dose of PCV13 vaccine. This PCV13 should be followed with a dose of pneumococcal polysaccharide (PPSV23) vaccine. Adults who are at high risk for pneumococcal disease should obtain the PPSV23 vaccine at least 8 weeks after the dose of PCV13 vaccine. Adults older than 74 years of age who have normal immune system function should obtain the PPSV23 vaccine dose at least 1 year after the dose of PCV13 vaccine.  Pneumococcal polysaccharide (PPSV23) vaccine. When PCV13 is also indicated, PCV13 should be obtained first. All adults aged 65 years and older should be immunized. An adult younger than age 65 years who has certain medical conditions should be immunized. Any person who resides in a nursing home or long-term care facility should be immunized. An adult smoker should be immunized. People with an immunocompromised condition and certain other conditions should receive both PCV13 and PPSV23 vaccines. People with human immunodeficiency virus (HIV) infection should be immunized as soon as possible after diagnosis. Immunization during chemotherapy or radiation therapy should be avoided. Routine use of PPSV23 vaccine is not recommended for American Indians, [place] Natives, or people younger than 65 years unless there are medical conditions that require PPSV23 vaccine. When indicated, people who have unknown immunization and have no record of immunization should receive PPSV23 vaccine. One-time revaccination 5 years after the first dose of PPSV23 is recommended for people aged 19-64 years who have chronic kidney failure, nephrotic syndrome, asplenia, or immunocompromised conditions. People who received 1-2 doses of PPSV23 before age 65 years should receive another dose of PPSV23 vaccine at age 65 years or later if at least 5 years have passed since the previous dose. Doses of PPSV23 are not needed for  people immunized with PPSV23 at or after age 65 years.  Meningococcal vaccine. Adults with asplenia or persistent complement component deficiencies should receive 2 doses of quadrivalent meningococcal conjugate (MenACWY-D) vaccine. The doses should be obtained at least 2 months apart. Microbiologists working with certain meningococcal bacteria, military recruits, people at risk during an outbreak, and people who travel to or live in countries with a high rate of meningitis should be immunized. A first-year college student up through age 21 years   who is living in a residence hall should receive a dose if he did not receive a dose on or after his 16th birthday. Adults who have certain high-risk conditions should receive one or more doses of vaccine.  Hepatitis A vaccine. Adults who wish to be protected from this disease, have chronic liver disease, work with hepatitis A-infected animals, work in hepatitis A research labs, or travel to or work in countries with a high rate of hepatitis A should be immunized. Adults who were previously unvaccinated and who anticipate close contact with an international adoptee during the first 60 days after arrival in the [place] from a country with a high rate of hepatitis A should be immunized.  Hepatitis B vaccine. Adults should be immunized if they wish to be protected from this disease, are under age 54 years and have diabetes, have chronic liver disease, have had more than one sex partner in the past 6 months, may be exposed to blood or other infectious body fluids, are household contacts or sex partners of hepatitis B positive people, are clients or workers in certain care facilities, or travel to or work in countries with a high rate of hepatitis B.  Haemophilus influenzae type b (Hib) vaccine. A previously unvaccinated person with asplenia or sickle cell disease or having a scheduled splenectomy should receive 1 dose of Hib vaccine. Regardless of previous  immunization, a recipient of a hematopoietic stem cell transplant should receive a 3-dose series 6-12 months after his successful transplant. Hib vaccine is not recommended for adults with HIV infection. Preventive Service / Frequency Ages 33 to 24  Blood pressure check.** / Every 3-5 years.  Lipid and cholesterol check.** / Every 5 years beginning at age 4.  Hepatitis C blood test.** / For any individual with known risks for hepatitis C.  Skin self-exam. / Monthly.  Influenza vaccine. / Every year.  Tetanus, diphtheria, and acellular pertussis (Tdap, Td) vaccine.** / Consult your health care provider. 1 dose of Td every 10 years.  Varicella vaccine.** / Consult your health care provider.  HPV vaccine. / 3 doses over 6 months, if 81 or younger.  Measles, mumps, rubella (MMR) vaccine.** / You need at least 1 dose of MMR if you were born in 1957 or later. You may also need a second dose.  Pneumococcal 13-valent conjugate (PCV13) vaccine.** / Consult your health care provider.  Pneumococcal polysaccharide (PPSV23) vaccine.** / 1 to 2 doses if you smoke cigarettes or if you have certain conditions.  Meningococcal vaccine.** / 1 dose if you are age 78 to 45 years and a Market researcher living in a residence hall, or have one of several medical conditions. You may also need additional booster doses.  Hepatitis A vaccine.** / Consult your health care provider.  Hepatitis B vaccine.** / Consult your health care provider.  Haemophilus influenzae type b (Hib) vaccine.** / Consult your health care provider. Ages 19 to 19  Blood pressure check.** / Every year.  Lipid and cholesterol check.** / Every 5 years beginning at age 50.  Lung cancer screening. / Every year if you are aged 76-80 years and have a 30-pack-year history of smoking and currently smoke or have quit within the past 15 years. Yearly screening is stopped once you have quit smoking for at least 15 years or develop  a health problem that would prevent you from having lung cancer treatment.  Fecal occult blood test (FOBT) of stool. / Every year beginning at age 32 and  continuing until age 51. You may not have to do this test if you get a colonoscopy every 10 years.  Flexible sigmoidoscopy** or colonoscopy.** / Every 5 years for a flexible sigmoidoscopy or every 10 years for a colonoscopy beginning at age 42 and continuing until age 62.  Hepatitis C blood test.** / For all people born from [birth year] through 1965 and any individual with known risks for hepatitis C.  Skin self-exam. / Monthly.  Influenza vaccine. / Every year.  Tetanus, diphtheria, and acellular pertussis (Tdap/Td) vaccine.** / Consult your health care provider. 1 dose of Td every 10 years.  Varicella vaccine.** / Consult your health care provider.  Zoster vaccine.** / 1 dose for adults aged 87 years or older.  Measles, mumps, rubella (MMR) vaccine.** / You need at least 1 dose of MMR if you were born in 1957 or later. You may also need a second dose.  Pneumococcal 13-valent conjugate (PCV13) vaccine.** / Consult your health care provider.  Pneumococcal polysaccharide (PPSV23) vaccine.** / 1 to 2 doses if you smoke cigarettes or if you have certain conditions.  Meningococcal vaccine.** / Consult your health care provider.  Hepatitis A vaccine.** / Consult your health care provider.  Hepatitis B vaccine.** / Consult your health care provider.  Haemophilus influenzae type b (Hib) vaccine.** / Consult your health care provider. Ages 85 and over  Blood pressure check.** / Every year.  Lipid and cholesterol check.**/ Every 5 years beginning at age 10.  Lung cancer screening. / Every year if you are aged 67-80 years and have a 30-pack-year history of smoking and currently smoke or have quit within the past 15 years. Yearly screening is stopped once you have quit smoking for at least 15 years or develop a health problem that would prevent  you from having lung cancer treatment.  Fecal occult blood test (FOBT) of stool. / Every year beginning at age 66 and continuing until age 81. You may not have to do this test if you get a colonoscopy every 10 years.  Flexible sigmoidoscopy** or colonoscopy.** / Every 5 years for a flexible sigmoidoscopy or every 10 years for a colonoscopy beginning at age 42 and continuing until age 83.  Hepatitis C blood test.** / For all people born from [birth year] through 1964 and any individual with known risks for hepatitis C.  Abdominal aortic aneurysm (AAA) screening.** / A one-time screening for ages 44 to 95 years who are current or former smokers.  Skin self-exam. / Monthly.  Influenza vaccine. / Every year.  Tetanus, diphtheria, and acellular pertussis (Tdap/Td) vaccine.** / 1 dose of Td every 10 years.  Varicella vaccine.** / Consult your health care provider.  Zoster vaccine.** / 1 dose for adults aged 73 years or older.  Pneumococcal 13-valent conjugate (PCV13) vaccine.** / 1 dose for all adults aged 67 years and older.  Pneumococcal polysaccharide (PPSV23) vaccine.** / 1 dose for all adults aged 72 years and older.  Meningococcal vaccine.** / Consult your health care provider.  Hepatitis A vaccine.** / Consult your health care provider.  Hepatitis B vaccine.** / Consult your health care provider.  Haemophilus influenzae type b (Hib) vaccine.** / Consult your health care provider. **Family history and personal history of risk and conditions may change your health care provider's recommendations.   This information is not intended to replace advice given to you by your health care provider. Make sure you discuss any questions you have with your health care provider.   Document Released: 04/03/2001 Document  Revised: 02/26/2014 Document Reviewed: 07/03/2010 [employer] Interactive Patient Education 2016 [employer]  

## 2016-01-08 DIAGNOSIS — R0981 Nasal congestion: Secondary | ICD-10-CM | POA: Insufficient documentation

## 2016-01-08 NOTE — Progress Notes (Signed)
Patient ID: Bradley Peterson, male   DOB: 1940/03/16, 75 y.o.   MRN: [id number]   Subjective:    Patient ID: Bradley Peterson, male    DOB: November 29, 1940, 75 y.o.   MRN: [id number]  Chief Complaint  Patient presents with  . Annual Exam    HPI Patient is in today for annual preventative exam and follow up on medical concerns. Has noted some increased head congestion recently but denies fevers, chills, myalgias or signs of acute illness. No recent hospitalizations. Denies CP/palp/SOB/HA/congestion/fevers/GI or GU c/o. Taking meds as prescribed. Is doing well with ADLs and is trying to stay active. Follows a heart healthy diet most of the time.  Past Medical History:  Diagnosis Date  . Allergic state 12/27/2013  . Annual physical exam 12/28/2012  . 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 Positive TB test     Past Surgical History:  Procedure Laterality Date  . TONSILLECTOMY     Late 70s    Family History  Problem Relation Age of Onset  . Heart disease Father     CABG at 36, died age 16  . Arthritis Father   . Arthritis Mother     Living 53  . Hypertension Mother   . Hyperlipidemia Mother   . Drug abuse Son     Social History   Social History  . Marital status: Married    Spouse name: N/A  . Number of children: 3  . Years of education: N/A   Occupational History  . Not on file.   Social History Main Topics  . Smoking status: Former Smoker    Types: Pipe    Start date: 02/19/1977  . Smokeless tobacco: Not on file     Comment: >25 years ago, pipe only.  . Alcohol use Yes     Comment: Maybe 1/2 drink a week  . Drug use: No  . Sexual activity: Yes     Comment: lives with wife, retired from education, [place], no major dietary restrictions   Other Topics Concern  . Not on file   Social History Narrative   Retired Pharmacist, community.    Lives with wife   No major dietary restrictions       Outpatient Medications Prior to Visit  Medication Sig Dispense  Refill  . Cholecalciferol (VITAMIN D3) 2000 UNITS capsule Take 2,000 Units by mouth daily.    . Ferrous Sulfate (IRON) 90 (18 Fe) MG TABS Take 1 capsule by mouth.    . fluticasone (FLONASE) 50 MCG/ACT nasal spray Place 2 sprays into both nostrils daily. 16 g 3  . levocetirizine (XYZAL) 5 MG tablet Take 1 tablet (5 mg total) by mouth every evening. 30 tablet 3  . NON FORMULARY as needed. OTC SINUS Medication.    Marland Kitchen aspirin 81 MG tablet Take 81 mg by mouth every other day.      No facility-administered medications prior to visit.     No Known Allergies  Review of Systems  Constitutional: Positive for malaise/fatigue. Negative for chills and fever.  HENT: Positive for congestion. Negative for hearing loss and sore throat.   Eyes: Negative for discharge.  Respiratory: Positive for sputum production. Negative for cough and shortness of breath.   Cardiovascular: Negative for chest pain, palpitations and leg swelling.  Gastrointestinal: Negative for abdominal pain, blood in stool, constipation, diarrhea, heartburn, nausea and vomiting.  Genitourinary: Negative for dysuria, frequency, hematuria and urgency.  Musculoskeletal: Negative for back pain, falls and  myalgias.  Skin: Negative for rash.  Neurological: Negative for dizziness, sensory change, loss of consciousness, weakness and headaches.  Endo/Heme/Allergies: Negative for environmental allergies. Does not bruise/bleed easily.  Psychiatric/Behavioral: Negative for depression and suicidal ideas. The patient is not nervous/anxious and does not have insomnia.        Objective:    Physical Exam  Constitutional: He is oriented to person, place, and time. He appears well-developed and well-nourished. No distress.  HENT:  Head: Normocephalic and atraumatic.  Eyes: Conjunctivae are normal.  Neck: Neck supple. No thyromegaly present.  Cardiovascular: Normal rate, regular rhythm and normal heart sounds.   No murmur heard. Pulmonary/Chest:  Effort normal and breath sounds normal. No respiratory distress. He has no wheezes.  Abdominal: Soft. Bowel sounds are normal. He exhibits no mass. There is no tenderness.  Musculoskeletal: He exhibits no edema.  Lymphadenopathy:    He has no cervical adenopathy.  Neurological: He is alert and oriented to person, place, and time.  Skin: Skin is warm and dry.  Psychiatric: He has a normal mood and affect. His behavior is normal.    BP 108/68 (BP Location: Left Arm, Patient Position: Sitting, Cuff Size: Normal)   Pulse (!) 53   Temp 97.8 F (36.6 C) (Oral)   Ht 5\' 8"  (1.727 m)   Wt 169 lb 4 oz (76.8 kg)   BMI 25.73 kg/m  Wt Readings from Last 3 Encounters:  12/30/15 169 lb 4 oz (76.8 kg)  09/15/15 166 lb 3.2 oz (75.4 kg)  04/14/15 172 lb 3.2 oz (78.1 kg)     Lab Results  Component Value Date   WBC 5.9 12/30/2015   HGB 14.3 12/30/2015   HCT 42.1 12/30/2015   PLT 264.0 12/30/2015   GLUCOSE 91 12/30/2015   CHOL 165 12/16/2012   TRIG 96 12/16/2012   HDL 53 12/16/2012   LDLCALC 93 12/16/2012   ALT 19 12/30/2015   AST 21 12/30/2015   NA 141 12/30/2015   K 4.5 12/30/2015   CL 105 12/30/2015   CREATININE 0.87 12/30/2015   BUN 17 12/30/2015   CO2 29 12/30/2015   TSH 0.64 12/28/2014   PSA 0.65 12/24/2013    Lab Results  Component Value Date   TSH 0.64 12/28/2014   Lab Results  Component Value Date   WBC 5.9 12/30/2015   HGB 14.3 12/30/2015   HCT 42.1 12/30/2015   MCV 91.7 12/30/2015   PLT 264.0 12/30/2015   Lab Results  Component Value Date   NA 141 12/30/2015   K 4.5 12/30/2015   CO2 29 12/30/2015   GLUCOSE 91 12/30/2015   BUN 17 12/30/2015   CREATININE 0.87 12/30/2015   BILITOT 0.6 12/30/2015   ALKPHOS 68 12/30/2015   AST 21 12/30/2015   ALT 19 12/30/2015   PROT 7.1 12/30/2015   ALBUMIN 4.3 12/30/2015   CALCIUM 9.9 12/30/2015   GFR 91.11 12/30/2015   Lab Results  Component Value Date   CHOL 165 12/16/2012   Lab Results  Component Value Date    HDL 53 12/16/2012   Lab Results  Component Value Date   LDLCALC 93 12/16/2012   Lab Results  Component Value Date   TRIG 96 12/16/2012   Lab Results  Component Value Date   CHOLHDL 3.1 12/16/2012   No results found for: HGBA1C     Assessment & Plan:   Problem List Items Addressed This Visit    Anemia - Primary   Relevant Orders   CBC (  Completed)   Annual physical exam    Patient encouraged to maintain heart healthy diet, regular exercise, adequate sleep. Consider daily probiotics. Take medications as prescribed. Given and reviewed copy of ACP documents from [employer] and encouraged to complete and return. Flu shot and pneumovax given today      Allergic state    Using xyzal and encouraged daily Flonase as needed.      Head congestion   Relevant Orders   Comprehensive metabolic panel (Completed)    Other Visit Diagnoses    Encounter for immunization       Relevant Orders   Flu vaccine HIGH DOSE PF (Completed)   CBC (Completed)   Comprehensive metabolic panel (Completed)   Need for 23-polyvalent pneumococcal polysaccharide vaccine       Relevant Orders   Pneumococcal polysaccharide vaccine 23-valent greater than or equal to 2yo subcutaneous/IM (Completed)      I am having Mr. Schul maintain his Vitamin D3, NON FORMULARY, aspirin, Iron, fluticasone, and levocetirizine.  No orders of the defined types were placed in this encounter.    Penni Homans, MD

## 2016-01-08 NOTE — Assessment & Plan Note (Signed)
Using xyzal and encouraged daily Flonase as needed.

## 2016-01-08 NOTE — Assessment & Plan Note (Addendum)
Patient encouraged to maintain heart healthy diet, regular exercise, adequate sleep. Consider daily probiotics. Take medications as prescribed. Given and reviewed copy of ACP documents from [employer] and encouraged to complete and return. Flu shot and pneumovax given today

## 2016-01-31 ENCOUNTER — Emergency Department (HOSPITAL_BASED_OUTPATIENT_CLINIC_OR_DEPARTMENT_OTHER): Payer: Medicare HMO

## 2016-01-31 ENCOUNTER — Telehealth: Payer: Self-pay | Admitting: Family Medicine

## 2016-01-31 ENCOUNTER — Emergency Department (HOSPITAL_BASED_OUTPATIENT_CLINIC_OR_DEPARTMENT_OTHER)
Admission: EM | Admit: 2016-01-31 | Discharge: 2016-01-31 | Disposition: A | Discharge status: Home / Self Care | Payer: Medicare HMO | Attending: Emergency Medicine | Admitting: Emergency Medicine

## 2016-01-31 ENCOUNTER — Encounter (HOSPITAL_BASED_OUTPATIENT_CLINIC_OR_DEPARTMENT_OTHER): Payer: Self-pay | Admitting: Emergency Medicine

## 2016-01-31 DIAGNOSIS — R0789 Other chest pain: Secondary | ICD-10-CM | POA: Insufficient documentation

## 2016-01-31 DIAGNOSIS — Z87891 Personal history of nicotine dependence: Secondary | ICD-10-CM | POA: Diagnosis not present

## 2016-01-31 DIAGNOSIS — R079 Chest pain, unspecified: Secondary | ICD-10-CM

## 2016-01-31 DIAGNOSIS — Z7982 Long term (current) use of aspirin: Secondary | ICD-10-CM | POA: Insufficient documentation

## 2016-01-31 LAB — BASIC METABOLIC PANEL
Anion gap: 6 (ref 5–15)
BUN: 17 mg/dL (ref 6–20)
CO2: 27 mmol/L (ref 22–32)
CREATININE: 0.82 mg/dL (ref 0.61–1.24)
Calcium: 9.8 mg/dL (ref 8.9–10.3)
Chloride: 106 mmol/L (ref 101–111)
Glucose, Bld: 100 mg/dL — ABNORMAL HIGH (ref 65–99)
Potassium: 4.5 mmol/L (ref 3.5–5.1)
SODIUM: 139 mmol/L (ref 135–145)

## 2016-01-31 LAB — CBC WITH DIFFERENTIAL/PLATELET
BASOS PCT: 1 %
Basophils Absolute: 0 10*3/uL (ref 0.0–0.1)
EOS ABS: 0.2 10*3/uL (ref 0.0–0.7)
EOS PCT: 3 %
HCT: 44.5 % (ref 39.0–52.0)
Hemoglobin: 15.1 g/dL (ref 13.0–17.0)
LYMPHS ABS: 1.4 10*3/uL (ref 0.7–4.0)
Lymphocytes Relative: 22 %
MCH: 30.9 pg (ref 26.0–34.0)
MCHC: 33.9 g/dL (ref 30.0–36.0)
MCV: 91.2 fL (ref 78.0–100.0)
Monocytes Absolute: 0.5 10*3/uL (ref 0.1–1.0)
Monocytes Relative: 8 %
NEUTROS PCT: 66 %
Neutro Abs: 4.3 10*3/uL (ref 1.7–7.7)
PLATELETS: 217 10*3/uL (ref 150–400)
RBC: 4.88 MIL/uL (ref 4.22–5.81)
RDW: 13 % (ref 11.5–15.5)
WBC: 6.4 10*3/uL (ref 4.0–10.5)

## 2016-01-31 LAB — TROPONIN I

## 2016-01-31 NOTE — ED Triage Notes (Signed)
Pt c/o intermittent LT CP since Friday after pulling stumps; moving does not change pain or frequency of pain

## 2016-01-31 NOTE — ED Provider Notes (Signed)
[facility] Provider Note   CSN: [id number] Arrival date & time: 01/31/16  1000     History   Chief Complaint Chief Complaint  Patient presents with  . Chest Pain    HPI Bradley Peterson is a 75 y.o. male.  The history is provided by the patient and medical records.    75 year old male with history of arthritis, seasonal allergies, anemia, atypical chest pain, presenting to the ED for chest pain. Patient reports on Friday he was pulling up some bushes from his front yard. States he was pulling on them for a few hours. States Friday evening and through the weekend he was having some intermittent left-sided chest pain described as aching. There were no alleviating or exacerbating factors. No shortness of breath, diaphoresis, nausea, vomiting, dizziness, neck pain, or left arm pain.  Patient states pain resolved yesterday and he went for a walk as normal without any symptoms. States he woke up this morning and just became somewhat concerned about the pain so he called his doctor who encouraged him to come here. Patient denies any current symptoms. States he feels fine. He has no known cardiac history aside from a mild murmur. He was told he had a "leaky valve" on his echocardiogram, however after evaluation with a cardiologist it was deemed this was not significant. His father does have history of coronary artery disease and quadruple bypass in the 90s. Patient was a former smoker, quit about 40 years ago.  Past Medical History:  Diagnosis Date  . Allergic state 12/27/2013  . Annual physical exam 12/28/2012  . 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 Positive TB test     Patient Active Problem List   Diagnosis Date Noted  . Head congestion 01/08/2016  . Medicare annual wellness visit, subsequent 12/27/2013  . Allergic state 12/27/2013  . Abnormal echocardiogram 01/07/2013  . Annual physical exam 12/28/2012  . 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  Internal hemorrhoid 02/25/2012  . Atypical chest pain 02/25/2012  . Anemia 02/25/2012    Past Surgical History:  Procedure Laterality Date  . TONSILLECTOMY     Late 1940s       Home Medications    Prior to Admission medications   Medication Sig Start Date End Date Taking? Authorizing Provider  aspirin 81 MG tablet Take 81 mg by mouth every other day.     Historical Provider, MD  Cholecalciferol (VITAMIN D3) 2000 UNITS capsule Take 2,000 Units by mouth daily.    Historical Provider, MD  Ferrous Sulfate (IRON) 90 (18 Fe) MG TABS Take 1 capsule by mouth.    Historical Provider, MD  fluticasone (FLONASE) 50 MCG/ACT nasal spray Place 2 sprays into both nostrils daily. 04/14/15   Percell Miller Saguier, PA-C  levocetirizine (XYZAL) 5 MG tablet Take 1 tablet (5 mg total) by mouth every evening. 04/14/15   Mackie Pai, PA-C  NON FORMULARY as needed. OTC SINUS Medication.    Historical Provider, MD    Family History Family History  Problem Relation Age of Onset  . Heart disease Father     CABG at 56, died age 42  . Arthritis Father   . Arthritis Mother     Living 35  . Hypertension Mother   . Hyperlipidemia Mother   . Drug abuse Son     Social History Social History  Substance Use Topics  . Smoking status: Former Smoker    Types: Pipe    Start date:  02/19/1977  . Smokeless tobacco: Never Used     Comment: >25 years ago, pipe only.  . Alcohol use Yes     Comment: Maybe 1/2 drink a week     Allergies   Patient has no known allergies.   Review of Systems Review of Systems  Cardiovascular: Positive for chest pain (resolved).  All other systems reviewed and are negative.    Physical Exam Updated Vital Signs BP 146/86 (BP Location: Left Arm)   Pulse 63   Temp 98.1 F (36.7 C) (Oral)   Resp 18   Ht 5\' 9"  (1.753 m)   Wt 74.8 kg   SpO2 95%   BMI 24.37 kg/m   Physical Exam  Constitutional: He is oriented to person, place, and time. He appears well-developed and  well-nourished.  HENT:  Head: Normocephalic and atraumatic.  Mouth/Throat: Oropharynx is clear and moist.  Eyes: Conjunctivae and EOM are normal. Pupils are equal, round, and reactive to light.  Neck: Normal range of motion.  Cardiovascular: Normal rate, regular rhythm and normal heart sounds.   Pulmonary/Chest: Effort normal and breath sounds normal. No respiratory distress. He has no wheezes.  Abdominal: Soft. Bowel sounds are normal. There is no tenderness. There is no rebound.  Musculoskeletal: Normal range of motion.  Neurological: He is alert and oriented to person, place, and time.  Skin: Skin is warm and dry.  Psychiatric: He has a normal mood and affect.  Nursing note and vitals reviewed.    ED Treatments / Results  Labs (all labs ordered are listed, but only abnormal results are displayed) Labs Reviewed  BASIC METABOLIC PANEL - Abnormal; Notable for the following:       Result Value   Glucose, Bld 100 (*)    All other components within normal limits  CBC WITH DIFFERENTIAL/PLATELET  TROPONIN I  TROPONIN I    EKG  EKG Interpretation  Date/Time:  Tuesday January 31 2016 10:12:43 EST Ventricular Rate:  61 PR Interval:    QRS Duration: 144 QT Interval:  416 QTC Calculation: 419 R Axis:   -32 Text Interpretation:  Sinus rhythm Prolonged PR interval Right bundle branch block Inverted T waves in 3, V1-V4 No Prior ECG for comparison.  No STEMI Abnormal ECG Confirmed by Sherry Ruffing MD, [place] [phone]) on 01/31/2016 10:18:13 AM       Radiology Dg Chest 2 View  Result Date: 01/31/2016 CLINICAL DATA:  Chest pain. EXAM: CHEST  2 VIEW COMPARISON:  02/25/2012. FINDINGS: Mediastinum and hilar structures normal. Lungs are clear. No pleural effusion or pneumothorax. Stable cardiomegaly. IMPRESSION: No acute cardiopulmonary disease. Electronically Signed   By: Marcello Moores  Register   On: 01/31/2016 10:43    Procedures Procedures (including critical care time)  Medications  Ordered in ED Medications - No data to display   Initial Impression / Assessment and Plan / ED Course  I have reviewed the triage vital signs and the nursing notes.  Pertinent labs & imaging results that were available during my care of the patient were reviewed by me and considered in my medical decision making (see chart for details).  Clinical Course    75 year old male here with left-sided chest pain. This started after pulling stumps out of his front yard. He is afebrile and nontoxic. His EKG reveals right bundle, no acute ischemic changes. Lab work including troponin is reassuring. Chest x-ray is clear. Patient was observed in the ED for a few hours without any acute events. His delta troponin remains normal. He  has remained largely asymptomatic here. States he feels well. His vitals are stable. At this time, low suspicion for ACS, PE, dissection, or other acute cardiac event. Patient has low risk heart score of 3. Feel this is likely musculoskeletal. Patient appears stable for discharge. He will follow-up with his primary care doctor.  Discussed plan with patient, he acknowledged understanding and agreed with plan of care.  Return precautions given for new or worsening symptoms.  Final Clinical Impressions(s) / ED Diagnoses   Final diagnoses:  Chest pain, unspecified type    New Prescriptions Discharge Medication List as of 01/31/2016  3:07 PM       Larene Pickett, PA-C 01/31/16 [place], MD 01/31/16 2252

## 2016-01-31 NOTE — Discharge Instructions (Signed)
your cardiac workup today was normal. I recommend that you follow-up with your primary care doctor. Please return here for any new or worsening symptoms-- recurrent chest pain, shortness of breath, dizziness, weakness, sweating, or vomiting.

## 2016-01-31 NOTE — Telephone Encounter (Signed)
Agree with urgent evaluation

## 2016-01-31 NOTE — Telephone Encounter (Signed)
See [facility] note. 

## 2016-01-31 NOTE — Telephone Encounter (Signed)
Patient Name: KENNA FOGELMAN DOB: 1940-07-23 Initial Comment Caller States- he is experiencing chest pain Nurse Assessment Nurse: Ronnald Ramp, RN, Miranda Date/Time (Eastern Time): 01/31/2016 9:18:59 AM Confirm and document reason for call. If symptomatic, describe symptoms. ---Caller states he has been having pain in his chest since Friday but he thought it is related to a pulled muscle from working outside. He is concerned now because the pain has not gone away and not made worse with movement. Does the patient have any new or worsening symptoms? ---Yes Will a triage be completed? ---Yes Related visit to physician within the last 2 weeks? ---No Does the PT have any chronic conditions? (i.e. diabetes, asthma, etc.) ---No Is this a behavioral health or substance abuse call? ---No Guidelines Guideline Title Affirmed Question Affirmed Notes Chest Pain Pain also present in shoulder(s) or arm(s) or jaw (Exception: pain is clearly made worse by movement) Final Disposition User Go to ED Now Ronnald Ramp, RN, Miranda Referrals [facility] - ED Disagree/Comply: Comply

## 2016-01-31 NOTE — ED Notes (Signed)
Pt sts discomfort in LT chest has returned, rates 1-2/10; provider notified

## 2016-01-31 NOTE — Telephone Encounter (Signed)
Patient called stating that he is having chest pain... He is not sure if maybe he pulled a muscle but he has been having the same pain for a few days. Sent to [facility].

## 2016-02-01 ENCOUNTER — Telehealth: Payer: Self-pay

## 2016-02-01 NOTE — Telephone Encounter (Signed)
Follow up call made to patient although notes indicate chest pain resolved. Left message for return call.

## 2016-02-02 ENCOUNTER — Telehealth: Payer: Self-pay

## 2016-02-02 NOTE — Telephone Encounter (Signed)
Patient returned follow up call. States he went to Ed had EXG,,XR,and labs done with no abnormalities found. States he is still having pains in chest but has no SOB. States the feeling is more of a nagging feeling than pain. Appointment scheduled for tomorrow 02/03/16 at [place] patient to go to Ed  Or UC if pain worsens or he developed SOB. Patient agreed.

## 2016-02-03 ENCOUNTER — Ambulatory Visit (INDEPENDENT_AMBULATORY_CARE_PROVIDER_SITE_OTHER): Payer: Medicare HMO | Admitting: Family Medicine

## 2016-02-03 VITALS — BP 120/72 | HR 60 | Temp 97.7°F | Wt 168.4 lb

## 2016-02-03 DIAGNOSIS — R0789 Other chest pain: Secondary | ICD-10-CM

## 2016-02-03 DIAGNOSIS — T7840XD Allergy, unspecified, subsequent encounter: Secondary | ICD-10-CM

## 2016-02-03 LAB — LIPID PANEL
CHOL/HDL RATIO: 3.5 ratio (ref <5.0)
Cholesterol: 170 mg/dL (ref <200)
HDL: 49 mg/dL (ref >40)
LDL CALC: 93 mg/dL (ref <100)
Triglycerides: 140 mg/dL (ref <150)
VLDL: 28 mg/dL (ref <30)

## 2016-02-03 LAB — TSH: TSH: 1.01 mIU/L (ref 0.40–4.50)

## 2016-02-03 NOTE — Progress Notes (Signed)
Patient ID: Bradley Peterson, male   DOB: 31-May-1940, 75 y.o.   MRN: [id number]   Subjective:    Patient ID: Bradley Peterson, male    DOB: February 18, 1941, 75 y.o.   MRN: [id number]  Chief Complaint  Patient presents with  . Follow-up    HPI Patient is in today for follow up pt has no acute concerns. He does endorse intermittent episodes of atypical chest pain. No pain today and no associated symptoms such as SOB, diaphoresis or nausea when these symptoms occur. Denies palp/SOB/HA/congestion/fevers/GI or GU c/o. Taking meds as prescribed  Past Medical History:  Diagnosis Date  . Allergic state 12/27/2013  . Annual physical exam 12/28/2012  . 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 Positive TB test     Past Surgical History:  Procedure Laterality Date  . TONSILLECTOMY     Late 22s    Family History  Problem Relation Age of Onset  . Heart disease Father     CABG at 63, died age 74  . Arthritis Father   . Arthritis Mother     Living 22  . Hypertension Mother   . Hyperlipidemia Mother   . Drug abuse Son     Social History   Social History  . Marital status: Married    Spouse name: N/A  . Number of children: 3  . Years of education: N/A   Occupational History  . Not on file.   Social History Main Topics  . Smoking status: Former Smoker    Types: Pipe    Start date: 02/19/1977  . Smokeless tobacco: Never Used     Comment: >25 years ago, pipe only.  . Alcohol use Yes     Comment: Maybe 1/2 drink a week  . Drug use: No  . Sexual activity: Yes     Comment: lives with wife, retired from education, [place], no major dietary restrictions   Other Topics Concern  . Not on file   Social History Narrative   Retired Pharmacist, community.    Lives with wife   No major dietary restrictions       Outpatient Medications Prior to Visit  Medication Sig Dispense Refill  . aspirin 81 MG tablet Take 81 mg by mouth every other day.     . Cholecalciferol (VITAMIN D3) 2000  UNITS capsule Take 2,000 Units by mouth daily.    . Ferrous Sulfate (IRON) 90 (18 Fe) MG TABS Take 1 capsule by mouth.    . fluticasone (FLONASE) 50 MCG/ACT nasal spray Place 2 sprays into both nostrils daily. 16 g 3  . levocetirizine (XYZAL) 5 MG tablet Take 1 tablet (5 mg total) by mouth every evening. 30 tablet 3  . NON FORMULARY as needed. OTC SINUS Medication.     No facility-administered medications prior to visit.     No Known Allergies  Review of Systems  Constitutional: Negative for fever.  Eyes: Negative.  Negative for blurred vision.  Respiratory: Negative for cough and shortness of breath.   Cardiovascular: Positive for chest pain. Negative for palpitations.  Gastrointestinal: Negative for vomiting.  Musculoskeletal: Negative for back pain.  Skin: Negative for rash.  Neurological: Negative for loss of consciousness and headaches.       Objective:    Physical Exam  Constitutional: He is oriented to person, place, and time. He appears well-developed and well-nourished. No distress.  HENT:  Head: Normocephalic and atraumatic.  Eyes: Conjunctivae are normal.  Neck: Normal range  of motion. No thyromegaly present.  Cardiovascular: Normal rate and regular rhythm.   Pulmonary/Chest: Effort normal and breath sounds normal. He has no wheezes.  Abdominal: Soft. Bowel sounds are normal. There is no tenderness.  Musculoskeletal: Normal range of motion. He exhibits no edema or deformity.  Neurological: He is alert and oriented to person, place, and time.  Skin: Skin is warm and dry. He is not diaphoretic.  Psychiatric: He has a normal mood and affect.    BP 120/72 (BP Location: Left Arm, Patient Position: Sitting, Cuff Size: Normal)   Pulse 60   Temp 97.7 F (36.5 C) (Oral)   Wt 168 lb 6.4 oz (76.4 kg)   SpO2 97%   BMI 24.87 kg/m  Wt Readings from Last 3 Encounters:  02/03/16 168 lb 6.4 oz (76.4 kg)  01/31/16 165 lb (74.8 kg)  12/30/15 169 lb 4 oz (76.8 kg)      Lab Results  Component Value Date   WBC 6.4 01/31/2016   HGB 15.1 01/31/2016   HCT 44.5 01/31/2016   PLT 217 01/31/2016   GLUCOSE 100 (H) 01/31/2016   CHOL 170 02/03/2016   TRIG 140 02/03/2016   HDL 49 02/03/2016   LDLCALC 93 02/03/2016   ALT 19 12/30/2015   AST 21 12/30/2015   NA 139 01/31/2016   K 4.5 01/31/2016   CL 106 01/31/2016   CREATININE 0.82 01/31/2016   BUN 17 01/31/2016   CO2 27 01/31/2016   TSH 1.01 02/03/2016   PSA 0.65 12/24/2013    Lab Results  Component Value Date   TSH 1.01 02/03/2016   Lab Results  Component Value Date   WBC 6.4 01/31/2016   HGB 15.1 01/31/2016   HCT 44.5 01/31/2016   MCV 91.2 01/31/2016   PLT 217 01/31/2016   Lab Results  Component Value Date   NA 139 01/31/2016   K 4.5 01/31/2016   CO2 27 01/31/2016   GLUCOSE 100 (H) 01/31/2016   BUN 17 01/31/2016   CREATININE 0.82 01/31/2016   BILITOT 0.6 12/30/2015   ALKPHOS 68 12/30/2015   AST 21 12/30/2015   ALT 19 12/30/2015   PROT 7.1 12/30/2015   ALBUMIN 4.3 12/30/2015   CALCIUM 9.8 01/31/2016   ANIONGAP 6 01/31/2016   GFR 91.11 12/30/2015   Lab Results  Component Value Date   CHOL 170 02/03/2016   Lab Results  Component Value Date   HDL 49 02/03/2016   Lab Results  Component Value Date   LDLCALC 93 02/03/2016   Lab Results  Component Value Date   TRIG 140 02/03/2016   Lab Results  Component Value Date   CHOLHDL 3.5 02/03/2016   No results found for: HGBA1C    I acted as a Education administrator for Dr. Charlett Peterson. Princess, RMA  Assessment & Plan:   Problem List Items Addressed This Visit    Atypical chest pain - Primary    No pain today but does endorse intermittent substernal chest pain without any associated symptoms. Referred to cardiology for further consideration.       Relevant Orders   Ambulatory referral to Cardiology   Lipid panel (Completed)   TSH (Completed)   Allergic state    Encouraged daily Xyzal when symptoms are flared.          I am  having Bradley Peterson maintain his Vitamin D3, NON FORMULARY, aspirin, Iron, fluticasone, and levocetirizine.  No orders of the defined types were placed in this encounter.  CMA served as Education administrator  during this visit. History, Physical and Plan performed by medical provider. Documentation and orders reviewed and attested to.   Penni Homans, MD

## 2016-02-03 NOTE — Patient Instructions (Signed)
Nonspecific Chest Pain °Chest pain can be caused by many different conditions. There is always a chance that your pain could be related to something serious, such as a heart attack or a blood clot in your lungs. Chest pain can also be caused by conditions that are not life-threatening. If you have chest pain, it is very important to follow up with your health care provider. °What are the causes? °Causes of this condition include: °· Heartburn. °· Pneumonia or bronchitis. °· Anxiety or stress. °· Inflammation around your heart (pericarditis) or lung (pleuritis or pleurisy). °· A blood clot in your lung. °· A collapsed lung (pneumothorax). This can develop suddenly on its own (spontaneous pneumothorax) or from trauma to the chest. °· Shingles infection (varicella-zoster virus). °· Heart attack. °· Damage to the bones, muscles, and cartilage that make up your chest wall. This can include: °? Bruised bones due to injury. °? Strained muscles or cartilage due to frequent or repeated coughing or overwork. °? Fracture to one or more ribs. °? Sore cartilage due to inflammation (costochondritis). ° °What increases the risk? °Risk factors for this condition may include: °· Activities that increase your risk for trauma or injury to your chest. °· Respiratory infections or conditions that cause frequent coughing. °· Medical conditions or overeating that can cause heartburn. °· Heart disease or family history of heart disease. °· Conditions or health behaviors that increase your risk of developing a blood clot. °· Having had chicken pox (varicella zoster). ° °What are the signs or symptoms? °Chest pain can feel like: °· Burning or tingling on the surface of your chest or deep in your chest. °· Crushing, pressure, aching, or squeezing pain. °· Dull or sharp pain that is worse when you move, cough, or take a deep breath. °· Pain that is also felt in your back, neck, shoulder, or arm, or pain that spreads to any of these  areas. ° °Your chest pain may come and go, or it may stay constant. °How is this diagnosed? °Lab tests or other studies may be needed to find the cause of your pain. Your health care provider may have you take a test called an ECG (electrocardiogram). An ECG records your heartbeat patterns at the time the test is performed. You may also have other tests, such as: °· Transthoracic echocardiogram (TTE). In this test, sound waves are used to create a picture of the heart structures and to look at how blood flows through your heart. °· Transesophageal echocardiogram (TEE). This is a more advanced imaging test that takes images from inside your body. It allows your health care provider to see your heart in finer detail. °· Cardiac monitoring. This allows your health care provider to monitor your heart rate and rhythm in real time. °· Holter monitor. This is a portable device that records your heartbeat and can help to diagnose abnormal heartbeats. It allows your health care provider to track your heart activity for several days, if needed. °· Stress tests. These can be done through exercise or by taking medicine that makes your heart beat more quickly. °· Blood tests. °· Other imaging tests. ° °How is this treated? °Treatment depends on what is causing your chest pain. Treatment may include: °· Medicines. These may include: °? Acid blockers for heartburn. °? Anti-inflammatory medicine. °? Pain medicine for inflammatory conditions. °? Antibiotic medicine, if an infection is present. °? Medicines to dissolve blood clots. °? Medicines to treat coronary artery disease (CAD). °· Supportive care for conditions that   do not require medicines. This may include: °? Resting. °? Applying heat or cold packs to injured areas. °? Limiting activities until pain decreases. ° °Follow these instructions at home: °Medicines °· If you were prescribed an antibiotic, take it as told by your health care provider. Do not stop taking the  antibiotic even if you start to feel better. °· Take over-the-counter and prescription medicines only as told by your health care provider. °Lifestyle °· Do not use any products that contain nicotine or tobacco, such as cigarettes and e-cigarettes. If you need help quitting, ask your health care provider. °· Do not drink alcohol. °· Make lifestyle changes as directed by your health care provider. These may include: °? Getting regular exercise. Ask your health care provider to suggest some activities that are safe for you. °? Eating a heart-healthy diet. A registered dietitian can help you to learn healthy eating options. °? Maintaining a healthy weight. °? Managing diabetes, if necessary. °? Reducing stress, such as with yoga or relaxation techniques. °General instructions °· Avoid any activities that bring on chest pain. °· If heartburn is the cause for your chest pain, raise (elevate) the head of your bed about 6 inches (15 cm) by putting blocks under the legs. Sleeping with more pillows does not effectively relieve heartburn because it only changes the position of your head. °· Keep all follow-up visits as told by your health care provider. This is important. This includes any further testing if your chest pain does not go away. °Contact a health care provider if: °· Your chest pain does not go away. °· You have a rash with blisters on your chest. °· You have a fever. °· You have chills. °Get help right away if: °· Your chest pain is worse. °· You have a cough that gets worse, or you cough up blood. °· You have severe pain in your abdomen. °· You have severe weakness. °· You faint. °· You have sudden, unexplained chest discomfort. °· You have sudden, unexplained discomfort in your arms, back, neck, or jaw. °· You have shortness of breath at any time. °· You suddenly start to sweat, or your skin gets clammy. °· You feel nauseous or you vomit. °· You suddenly feel light-headed or dizzy. °· Your heart begins to beat  quickly, or it feels like it is skipping beats. °These symptoms may represent a serious problem that is an emergency. Do not wait to see if the symptoms will go away. Get medical help right away. Call your local emergency services (911 in the [place] Do not drive yourself to the hospital. °This information is not intended to replace advice given to you by your health care provider. Make sure you discuss any questions you have with your health care provider. °Document Released: 11/15/2004 Document Revised: 10/31/2015 Document Reviewed: 10/31/2015 °Elsevier Interactive Patient Education © 2017 [employer] ° °

## 2016-02-03 NOTE — Progress Notes (Signed)
Pre visit review using our clinic review tool, if applicable. No additional management support is needed unless otherwise documented below in the visit note. 

## 2016-02-21 NOTE — Assessment & Plan Note (Signed)
Encouraged daily Xyzal when symptoms are flared.

## 2016-02-21 NOTE — Assessment & Plan Note (Signed)
No pain today but does endorse intermittent substernal chest pain without any associated symptoms. Referred to cardiology for further consideration.

## 2016-03-01 ENCOUNTER — Encounter: Payer: Self-pay | Admitting: Family Medicine

## 2016-03-01 ENCOUNTER — Ambulatory Visit (INDEPENDENT_AMBULATORY_CARE_PROVIDER_SITE_OTHER): Payer: Medicare HMO | Admitting: Family Medicine

## 2016-03-01 VITALS — BP 123/78 | HR 63 | Temp 98.0°F | Ht 68.0 in | Wt 171.4 lb

## 2016-03-01 DIAGNOSIS — J208 Acute bronchitis due to other specified organisms: Secondary | ICD-10-CM

## 2016-03-01 DIAGNOSIS — B9689 Other specified bacterial agents as the cause of diseases classified elsewhere: Secondary | ICD-10-CM

## 2016-03-01 MED ORDER — FLUTICASONE PROPIONATE 50 MCG/ACT NA SUSP: 2.0000 | Freq: Every day | NASAL | 3 refills | Status: DC

## 2016-03-01 MED ORDER — AZITHROMYCIN 250 MG PO TABS: ORAL_TABLET | ORAL | 0 refills | Status: DC

## 2016-03-01 MED ORDER — BENZONATATE 100 MG PO CAPS: 100.0000 mg | ORAL_CAPSULE | Freq: Three times a day (TID) | ORAL | 0 refills | Status: DC | PRN

## 2016-03-01 NOTE — Progress Notes (Signed)
Chief Complaint  Patient presents with  . Cough    (product-green) x 1 week,along with chest tightness across the upper chest x 2 days.    [name] here for URI complaints.  Duration: 1 week  Associated symptoms: sinus congestion, rhinorrhea, itchy watery eyes and productive cough Denies: subjective fever, ear pain, ear drainage, sore throat, shortness of breath and myalgia Treatment to date: Mucinex, Robitussin Sick contacts: No  ROS:  Const: Denies fevers HEENT: As noted in HPI Lungs: No SOB  Past Medical History:  Diagnosis Date  . Allergic state 12/27/2013  . 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 Positive TB test    Family History  Problem Relation Age of Onset  . Heart disease Father     CABG at 42, died age 51  . Arthritis Father   . Arthritis Mother     Living 81  . Hypertension Mother   . Hyperlipidemia Mother   . Drug abuse Son     BP 123/78 (BP Location: Right Arm, Patient Position: Sitting, Cuff Size: Normal)   Pulse 63   Temp 98 F (36.7 C) (Oral)   Ht 5\' 8"  (1.727 m)   Wt 171 lb 6.4 oz (77.7 kg)   SpO2 98%   BMI 26.06 kg/m  General: Awake, alert, appears stated age HEENT: AT, [place], ears patent b/l and TM's neg, nares patent w/o discharge, pharynx pink and without exudates, MMM Neck: No masses or asymmetry Heart: RRR, no murmurs, no bruits Lungs: CTAB, no accessory muscle use Psych: Age appropriate judgment and insight, normal mood and affect  Acute bacterial bronchitis - Plan: fluticasone (FLONASE) 50 MCG/ACT nasal spray, benzonatate (TESSALON) 100 MG capsule, azithromycin (ZITHROMAX) 250 MG tablet  Orders as above. Wait 3 days before taking Zpak. Supportive care. Continue to push fluids, practice good hand hygiene, cover mouth when coughing. F/u in 1 week if symptoms worsen or fail to improve. Pt voiced understanding and agreement to the plan.  [place], DO 03/01/16 8:22 AM

## 2016-03-01 NOTE — Patient Instructions (Signed)
Do not take the antibiotic for 3 days. Continue supportive care in addition to using the cough medicine. Start using Flonase routinely. Start back on Xyzal as well.  Continue to practice good hand hygiene, cover your mouth when you cough, and drink lots of fluids.  If symptoms start to worsen or fail to improve, let us know.

## 2016-03-07 ENCOUNTER — Ambulatory Visit: Payer: Medicare HMO | Admitting: Cardiology

## 2016-03-13 ENCOUNTER — Encounter: Payer: Self-pay | Admitting: Cardiovascular Disease

## 2016-03-13 ENCOUNTER — Ambulatory Visit (INDEPENDENT_AMBULATORY_CARE_PROVIDER_SITE_OTHER): Payer: Medicare HMO | Admitting: Cardiovascular Disease

## 2016-03-13 VITALS — BP 126/88 | HR 62 | Ht 68.0 in | Wt 171.0 lb

## 2016-03-13 DIAGNOSIS — I4510 Unspecified right bundle-branch block: Secondary | ICD-10-CM | POA: Diagnosis not present

## 2016-03-13 DIAGNOSIS — R0789 Other chest pain: Secondary | ICD-10-CM | POA: Diagnosis not present

## 2016-03-13 NOTE — Assessment & Plan Note (Signed)
Right bundle branch block new since last seen in 2014

## 2016-03-13 NOTE — Patient Instructions (Signed)
Medication Instructions: Your physician recommends that you continue on your current medications as directed. Please refer to the Current Medication list given to you today.   Testing/Procedures: Your physician has requested that you have an exercise stress myoview. For further information please visit [email or web address]. Please follow instruction sheet, as given.  Follow-Up: Your physician recommends that you schedule a follow-up appointment in: 3 months with Dr. Gwenlyn Found.   Any Other Special Instructions will be listed below:  Pharmacologic Stress Electrocardiogram A pharmacologic stress electrocardiogram is a heart (cardiac) test that uses nuclear imaging to evaluate the blood supply to your heart. This test may also be called a pharmacologic stress electrocardiography. Pharmacologic means that a medicine is used to increase your heart rate and blood pressure.  This stress test is done to find areas of poor blood flow to the heart by determining the extent of coronary artery disease (CAD). Some people exercise on a treadmill, which naturally increases the blood flow to the heart. For those people unable to exercise on a treadmill, a medicine is used. This medicine stimulates your heart and will cause your heart to beat harder and more quickly, as if you were exercising.  Pharmacologic stress tests can help determine:  The adequacy of blood flow to your heart during increased levels of activity in order to clear you for discharge home.  The extent of coronary artery blockage caused by CAD.  Your prognosis if you have suffered a heart attack.  The effectiveness of cardiac procedures done, such as an angioplasty, which can increase the circulation in your coronary arteries.  Causes of chest pain or pressure. LET [facility] CARE PROVIDER KNOW ABOUT:  Any allergies you have.  All medicines you are taking, including vitamins, herbs, eye drops, creams, and over-the-counter  medicines.  Previous problems you or members of your family have had with the use of anesthetics.  Any blood disorders you have.  Previous surgeries you have had.  Medical conditions you have.  Possibility of pregnancy, if this applies.  If you are currently breastfeeding. RISKS AND COMPLICATIONS Generally, this is a safe procedure. However, as with any procedure, complications can occur. Possible complications include:  You develop pain or pressure in the following areas:  Chest.  Jaw or neck.  Between your shoulder blades.  Radiating down your left arm.  Headache.  Dizziness or light-headedness.  Shortness of breath.  Increased or irregular heartbeat.  Low blood pressure.  Nausea or vomiting.  Flushing.  Redness going up the arm and slight pain during injection of medicine.  Heart attack (rare). BEFORE THE PROCEDURE   Avoid all forms of caffeine for 24 hours before your test or as directed by your health care provider. This includes coffee, tea (even decaffeinated tea), caffeinated sodas, chocolate, cocoa, and certain pain medicines.  Follow your health care provider's instructions regarding eating and drinking before the test.  Take your medicines as directed at regular times with water unless instructed otherwise. Exceptions may include:  If you have diabetes, ask how you are to take your insulin or pills. It is common to adjust insulin dosing the morning of the test.  If you are taking beta-blocker medicines, it is important to talk to your health care provider about these medicines well before the date of your test. Taking beta-blocker medicines may interfere with the test. In some cases, these medicines need to be changed or stopped 24 hours or more before the test.  If you wear a nitroglycerin patch, it may  need to be removed prior to the test. Ask your health care provider if the patch should be removed before the test.  If you use an inhaler for any  breathing condition, bring it with you to the test.  If you are an outpatient, bring a snack so you can eat right after the stress phase of the test.  Do not smoke for 4 hours prior to the test or as directed by your health care provider.  Do not apply lotions, powders, creams, or oils on your chest prior to the test.  Wear comfortable shoes and clothing. Let your health care provider know if you were unable to complete or follow the preparations for your test. PROCEDURE   Multiple patches (electrodes) will be put on your chest. If needed, small areas of your chest may be shaved to get better contact with the electrodes. Once the electrodes are attached to your body, multiple wires will be attached to the electrodes, and your heart rate will be monitored.  An IV access will be started. A nuclear trace (isotope) is given. The isotope may be given intravenously, or it may be swallowed. Nuclear refers to several types of radioactive isotopes, and the nuclear isotope lights up the arteries so that the nuclear images are clear. The isotope is absorbed by your body. This results in low radiation exposure.  A resting nuclear image is taken to show how your heart functions at rest.  A medicine is given through the IV access.  A second scan is done about 1 hour after the medicine injection and determines how your heart functions under stress.  During this stress phase, you will be connected to an electrocardiogram machine. Your blood pressure and oxygen levels will be monitored. AFTER THE PROCEDURE   Your heart rate and blood pressure will be monitored after the test.  You may return to your normal schedule, including diet,activities, and medicines, unless your health care provider tells you otherwise. This information is not intended to replace advice given to you by your health care provider. Make sure you discuss any questions you have with your health care provider. Document Released:  06/24/2008 Document Revised: 02/10/2013 Document Reviewed: 10/13/2012 Elsevier Interactive Patient Education  2017 [employer].  If you need a refill on your cardiac medications before your next appointment, please call your pharmacy.

## 2016-03-13 NOTE — Progress Notes (Signed)
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 He has seen Dr. Percival Spanish in the past. He was referred by Dr. Randel Pigg for cardiovascular evaluation because of new onset atypical chest pain. He basically has no cardiac risk factors his father did have bypass surgery in his 56s. He has no history of high blood pressure, diabetes or hyperlipidemia. He does not smoke. He's never had a heart attack or stroke. He was removing some tree stumps approximate one month ago and the following day developed some upper chest discomfort which has been occurring off and on since he was evaluated in the emergency room for this. There are no other associated symptoms.   Current Outpatient Prescriptions  Medication Sig Dispense Refill  . aspirin 81 MG tablet Take 81 mg by mouth every other day.     . benzonatate (TESSALON) 100 MG capsule Take 1 capsule (100 mg total) by mouth 3 (three) times daily as needed. 30 capsule 0  . Cholecalciferol (VITAMIN D3) 2000 UNITS capsule Take 2,000 Units by mouth daily.    . Ferrous Sulfate (IRON) 90 (18 Fe) MG TABS Take 1 capsule by mouth.    . fluticasone (FLONASE) 50 MCG/ACT nasal spray Place 2 sprays into both nostrils daily. 16 g 3  . levocetirizine (XYZAL) 5 MG tablet Take 1 tablet (5 mg total) by mouth every evening. 30 tablet 3  . NON FORMULARY as needed. OTC SINUS Medication.     No current facility-administered medications for this visit.     No Known Allergies  Social History   Social History  . Marital status: Married    Spouse name: N/A  . Number of children: 3  . Years of education: N/A   Occupational History  .  Not on file.   Social History Main Topics  . Smoking status: Former Smoker    Types: Pipe    Start date: 02/19/1977  . Smokeless tobacco: Never Used     Comment: >25 years ago, pipe only.  . Alcohol use Yes     Comment: Maybe 1/2 drink a week  . Drug use: No  . Sexual activity: Yes     Comment: lives with wife, retired from education, [place], no major dietary restrictions   Other Topics Concern  . Not on file   Social History Narrative   Retired Pharmacist, community.    Lives with wife   No major dietary restrictions        Review of Systems: General: negative for chills, fever, night sweats or weight changes.  Cardiovascular: negative for chest pain, dyspnea on exertion, edema, orthopnea, palpitations, paroxysmal nocturnal dyspnea or shortness of breath Dermatological: negative for rash Respiratory: negative for cough or wheezing Urologic: negative for hematuria Abdominal: negative for nausea, vomiting, diarrhea, bright red blood per rectum, melena, or hematemesis Neurologic: negative for visual changes, syncope, or dizziness All other systems reviewed and are otherwise negative except as noted above.    Blood pressure 126/88, pulse 62, height 5\' 8"  (1.727 m), weight 171 lb (77.6 kg).  General appearance: alert and no distress Neck: no adenopathy, no carotid bruit, no JVD, supple, symmetrical,  trachea midline and thyroid not enlarged, symmetric, no tenderness/mass/nodules Lungs: clear to auscultation bilaterally Heart: regular rate and rhythm, S1, S2 normal, no murmur, click, rub or gallop Extremities: extremities normal, atraumatic, no cyanosis or edema  EKG sinus rhythm at 62 with right bundle branch block new since the previous tracing. I personally reviewed this EKG  ASSESSMENT AND PLAN:   Right bundle branch block Right bundle branch block new since last seen in 2014  Atypical chest pain Mr. Moriyama has had atypical chest pain since performing  upper body strenuous activity removing tree stumps on month ago. He's had 3 episodes last week. He has been seen in the emergency room for this. The pain sounds somewhat atypical and he has minimal risk factors. He does have a new right bundle branch block. I'm going to get exercise Myoview stress test to further evaluate. Of note, he did have a routine GXT 4 years ago which was normal.      Lorretta Harp MD [facility] 03/13/2016 9:38 AM

## 2016-03-13 NOTE — Assessment & Plan Note (Signed)
Mr. Botz has had atypical chest pain since performing upper body strenuous activity removing tree stumps on month ago. He's had 3 episodes last week. He has been seen in the emergency room for this. The pain sounds somewhat atypical and he has minimal risk factors. He does have a new right bundle branch block. I'm going to get exercise Myoview stress test to further evaluate. Of note, he did have a routine GXT 4 years ago which was normal.

## 2016-03-15 ENCOUNTER — Telehealth (HOSPITAL_COMMUNITY): Payer: Self-pay

## 2016-03-15 NOTE — Telephone Encounter (Signed)
Encounter complete. 

## 2016-03-20 ENCOUNTER — Ambulatory Visit (HOSPITAL_COMMUNITY)
Admission: RE | Admit: 2016-03-20 | Discharge: 2016-03-20 | Disposition: A | Discharge status: Home / Self Care | Payer: Medicare HMO | Source: Ambulatory Visit | Attending: Cardiovascular Disease | Admitting: Cardiovascular Disease

## 2016-03-20 DIAGNOSIS — R0789 Other chest pain: Secondary | ICD-10-CM | POA: Insufficient documentation

## 2016-03-20 LAB — MYOCARDIAL PERFUSION IMAGING
CHL CUP MPHR: 146 {beats}/min
CHL CUP NUCLEAR SRS: 0
CHL CUP NUCLEAR SSS: 0
CHL RATE OF PERCEIVED EXERTION: 17
CSEPED: 10 min
CSEPEDS: 31 s
Estimated workload: 12.5 METS
LV dias vol: 105 mL (ref 62–150)
LVSYSVOL: 47 mL
NUC STRESS TID: 0.9
Peak HR: 137 {beats}/min
Percent HR: 93 %
Rest HR: 56 {beats}/min
SDS: 0

## 2016-03-20 MED ORDER — TECHNETIUM TC 99M TETROFOSMIN IV KIT
30.8000 | PACK | Freq: Once | INTRAVENOUS | Status: AC | PRN
  Administered 2016-03-20: 30.8 via INTRAVENOUS
  Filled 2016-03-20: qty 31

## 2016-03-20 MED ORDER — TECHNETIUM TC 99M TETROFOSMIN IV KIT
9.5000 | PACK | Freq: Once | INTRAVENOUS | Status: AC | PRN
  Administered 2016-03-20: 9.5 via INTRAVENOUS
  Filled 2016-03-20: qty 10

## 2016-05-28 DIAGNOSIS — L570 Actinic keratosis: Secondary | ICD-10-CM | POA: Diagnosis not present

## 2016-06-06 ENCOUNTER — Telehealth: Payer: Self-pay | Admitting: Cardiovascular Disease

## 2016-06-06 NOTE — Telephone Encounter (Signed)
Closed encounter °

## 2016-06-12 ENCOUNTER — Ambulatory Visit: Payer: Medicare HMO | Admitting: Cardiovascular Disease

## 2016-06-15 ENCOUNTER — Encounter: Payer: Self-pay | Admitting: Cardiovascular Disease

## 2016-06-15 ENCOUNTER — Ambulatory Visit (INDEPENDENT_AMBULATORY_CARE_PROVIDER_SITE_OTHER): Payer: Medicare HMO | Admitting: Cardiovascular Disease

## 2016-06-15 DIAGNOSIS — R0789 Other chest pain: Secondary | ICD-10-CM

## 2016-06-15 NOTE — Assessment & Plan Note (Signed)
Bradley Peterson returns today for follow-up. I last saw him in the office 03/13/16 at the request of Dr. Charlett Blake. Had some atypical chest pain. He had a Myoview stress test performed in our office 03/22/16 which was entirely normal. He's had no recurrent symptoms. I will see him back when necessary.

## 2016-06-15 NOTE — Progress Notes (Signed)
Bradley Peterson returns today for follow-up. I last saw him in the office 03/13/16 at the request of Dr. Charlett Blake. Had some atypical chest pain. He had a Myoview stress test performed in our office 03/22/16 which was entirely normal. He's had no recurrent symptoms. I will see him back when necessary.

## 2016-06-15 NOTE — Patient Instructions (Signed)
Your physician recommends that you schedule a follow-up appointment as needed with Dr. Berry   

## 2016-08-03 ENCOUNTER — Ambulatory Visit: Payer: Medicare HMO | Admitting: Family Medicine

## 2016-08-07 ENCOUNTER — Encounter: Payer: Self-pay | Admitting: Family Medicine

## 2016-08-07 ENCOUNTER — Ambulatory Visit (INDEPENDENT_AMBULATORY_CARE_PROVIDER_SITE_OTHER): Payer: Medicare HMO | Admitting: Family Medicine

## 2016-08-07 DIAGNOSIS — D649 Anemia, unspecified: Secondary | ICD-10-CM

## 2016-08-07 DIAGNOSIS — R0789 Other chest pain: Secondary | ICD-10-CM

## 2016-08-07 NOTE — Assessment & Plan Note (Signed)
Resolved with last blood draw will continue to monitor

## 2016-08-07 NOTE — Patient Instructions (Addendum)
Shingrix shots to prevent shingles at the pharmacy  Anemia, Nonspecific Anemia is a condition in which the concentration of red blood cells or hemoglobin in the blood is below normal. Hemoglobin is a substance in red blood cells that carries oxygen to the tissues of the body. Anemia results in not enough oxygen reaching these tissues. What are the causes? Common causes of anemia include:  Excessive bleeding. Bleeding may be internal or external. This includes excessive bleeding from periods (in women) or from the intestine.  Poor nutrition.  Chronic kidney, thyroid, and liver disease.  Bone marrow disorders that decrease red blood cell production.  Cancer and treatments for cancer.  HIV, AIDS, and their treatments.  Spleen problems that increase red blood cell destruction.  Blood disorders.  Excess destruction of red blood cells due to infection, medicines, and autoimmune disorders.  What are the signs or symptoms?  Minor weakness.  Dizziness.  Headache.  Palpitations.  Shortness of breath, especially with exercise.  Paleness.  Cold sensitivity.  Indigestion.  Nausea.  Difficulty sleeping.  Difficulty concentrating. Symptoms may occur suddenly or they may develop slowly. How is this diagnosed? Additional blood tests are often needed. These help your health care provider determine the best treatment. Your health care provider will check your stool for blood and look for other causes of blood loss. How is this treated? Treatment varies depending on the cause of the anemia. Treatment can include:  Supplements of iron, vitamin Z61, or folic acid.  Hormone medicines.  A blood transfusion. This may be needed if blood loss is severe.  Hospitalization. This may be needed if there is significant continual blood loss.  Dietary changes.  Spleen removal.  Follow these instructions at home: Keep all follow-up appointments. It often takes many weeks to correct  anemia, and having your health care provider check on your condition and your response to treatment is very important. Get help right away if:  You develop extreme weakness, shortness of breath, or chest pain.  You become dizzy or have trouble concentrating.  You develop heavy vaginal bleeding.  You develop a rash.  You have bloody or black, tarry stools.  You faint.  You vomit up blood.  You vomit repeatedly.  You have abdominal pain.  You have a fever or persistent symptoms for more than 2-3 days.  You have a fever and your symptoms suddenly get worse.  You are dehydrated. This information is not intended to replace advice given to you by your health care provider. Make sure you discuss any questions you have with your health care provider. Document Released: 03/15/2004 Document Revised: 07/20/2015 Document Reviewed: 08/01/2012 Elsevier Interactive Patient Education  2017 [employer].

## 2016-08-07 NOTE — Progress Notes (Signed)
Subjective:  I acted as a Education administrator for Dr. Charlett Blake. [place], [place]  Patient ID: Bradley Peterson, male    DOB: [birth year], 76 y.o.   MRN: [id number]  Chief Complaint  Patient presents with  . Follow-up    HPI  Patient is in today for a 6 month follow up. Patient has no acute concerns. No recent febrile illness or acute hospitalizations. Denies CP/palp/SOB/HA/congestion/fevers/GI or GU c/o. Taking meds as prescribed    Patient Care Team: Mosie Lukes, MD as PCP - General (Family Medicine)   Past Medical History:  Diagnosis Date  . Allergic state 12/27/2013  . 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 Positive TB test     Past Surgical History:  Procedure Laterality Date  . TONSILLECTOMY     Late 82s    Family History  Problem Relation Age of Onset  . Heart disease Father        CABG at 27, died age 17  . Arthritis Father   . Arthritis Mother        Living 72  . Hypertension Mother   . Hyperlipidemia Mother   . Drug abuse Son     Social History   Social History  . Marital status: Married    Spouse name: N/A  . Number of children: 3  . Years of education: N/A   Occupational History  . Not on file.   Social History Main Topics  . Smoking status: Former Smoker    Types: Pipe    Start date: 02/19/1977  . Smokeless tobacco: Never Used     Comment: >25 years ago, pipe only.  . Alcohol use Yes     Comment: Maybe 1/2 drink a week  . Drug use: No  . Sexual activity: Yes     Comment: lives with wife, retired from education, [place], no major dietary restrictions   Other Topics Concern  . Not on file   Social History Narrative   Retired Pharmacist, community.    Lives with wife   No major dietary restrictions       Outpatient Medications Prior to Visit  Medication Sig Dispense Refill  . aspirin 81 MG tablet Take 81 mg by mouth every other day.     . Cholecalciferol (VITAMIN D3) 2000 UNITS capsule Take 2,000 Units by mouth daily.    . Ferrous Sulfate  (IRON) 90 (18 Fe) MG TABS Take 1 capsule by mouth.    . fluticasone (FLONASE) 50 MCG/ACT nasal spray Place 2 sprays into both nostrils daily. 16 g 3  . levocetirizine (XYZAL) 5 MG tablet Take 1 tablet (5 mg total) by mouth every evening. 30 tablet 3  . NON FORMULARY as needed. OTC SINUS Medication.    . benzonatate (TESSALON) 100 MG capsule Take 1 capsule (100 mg total) by mouth 3 (three) times daily as needed. 30 capsule 0   No facility-administered medications prior to visit.     No Known Allergies  Review of Systems  Constitutional: Negative for fever and malaise/fatigue.  HENT: Negative for congestion.   Eyes: Negative for blurred vision.  Respiratory: Negative for cough and shortness of breath.   Cardiovascular: Negative for chest pain, palpitations and leg swelling.  Gastrointestinal: Negative for vomiting.  Musculoskeletal: Negative for back pain.  Skin: Negative for rash.  Neurological: Negative for loss of consciousness and headaches.       Objective:    Physical Exam  Constitutional: He is oriented to person, place,  and time. He appears well-developed and well-nourished. No distress.  HENT:  Head: Normocephalic and atraumatic.  Eyes: Conjunctivae are normal.  Neck: Normal range of motion. No thyromegaly present.  Cardiovascular: Normal rate and regular rhythm.   Pulmonary/Chest: Effort normal and breath sounds normal. He has no wheezes.  Abdominal: Soft. Bowel sounds are normal. There is no tenderness.  Musculoskeletal: Normal range of motion. He exhibits no edema or deformity.  Neurological: He is alert and oriented to person, place, and time.  Skin: Skin is warm and dry. He is not diaphoretic.  Psychiatric: He has a normal mood and affect.    BP 102/68 (BP Location: Left Arm, Patient Position: Sitting, Cuff Size: Normal)   Pulse 62   Temp 98.3 F (36.8 C) (Oral)   Resp 18   Wt 162 lb 4.8 oz (73.6 kg)   SpO2 96%   BMI 24.68 kg/m  Wt Readings from Last 3  Encounters:  08/07/16 162 lb 4.8 oz (73.6 kg)  06/15/16 165 lb 6.4 oz (75 kg)  03/20/16 171 lb (77.6 kg)   BP Readings from Last 3 Encounters:  08/07/16 102/68  06/15/16 114/77  03/13/16 126/88     Immunization History  Administered Date(s) Administered  . Influenza, High Dose Seasonal PF 12/23/2012, 12/30/2015  . Influenza,inj,Quad PF,36+ Mos 12/24/2013, 12/28/2014  . Pneumococcal Conjugate-13 12/24/2013  . Pneumococcal Polysaccharide-23 12/30/2015  . Tdap 12/28/2014  . Zoster 02/20/2012    Health Maintenance  Topic Date Due  . INFLUENZA VACCINE  09/19/2016  . COLONOSCOPY  02/05/2021  . TETANUS/TDAP  12/27/2024  . PNA vac Low Risk Adult  Completed    Lab Results  Component Value Date   WBC 6.4 01/31/2016   HGB 15.1 01/31/2016   HCT 44.5 01/31/2016   PLT 217 01/31/2016   GLUCOSE 100 (H) 01/31/2016   CHOL 170 02/03/2016   TRIG 140 02/03/2016   HDL 49 02/03/2016   LDLCALC 93 02/03/2016   ALT 19 12/30/2015   AST 21 12/30/2015   NA 139 01/31/2016   K 4.5 01/31/2016   CL 106 01/31/2016   CREATININE 0.82 01/31/2016   BUN 17 01/31/2016   CO2 27 01/31/2016   TSH 1.01 02/03/2016   PSA 0.65 12/24/2013    Lab Results  Component Value Date   TSH 1.01 02/03/2016   Lab Results  Component Value Date   WBC 6.4 01/31/2016   HGB 15.1 01/31/2016   HCT 44.5 01/31/2016   MCV 91.2 01/31/2016   PLT 217 01/31/2016   Lab Results  Component Value Date   NA 139 01/31/2016   K 4.5 01/31/2016   CO2 27 01/31/2016   GLUCOSE 100 (H) 01/31/2016   BUN 17 01/31/2016   CREATININE 0.82 01/31/2016   BILITOT 0.6 12/30/2015   ALKPHOS 68 12/30/2015   AST 21 12/30/2015   ALT 19 12/30/2015   PROT 7.1 12/30/2015   ALBUMIN 4.3 12/30/2015   CALCIUM 9.8 01/31/2016   ANIONGAP 6 01/31/2016   GFR 91.11 12/30/2015   Lab Results  Component Value Date   CHOL 170 02/03/2016   Lab Results  Component Value Date   HDL 49 02/03/2016   Lab Results  Component Value Date   LDLCALC 93  02/03/2016   Lab Results  Component Value Date   TRIG 140 02/03/2016   Lab Results  Component Value Date   CHOLHDL 3.5 02/03/2016   No results found for: HGBA1C       Assessment & Plan:   Problem  List Items Addressed This Visit    Atypical chest pain    Has been cleared by cardiology after stress test and has had no recurrence. He will report if symptoms return      Anemia    Resolved with last blood draw will continue to monitor         I have discontinued Mr. Sookram's benzonatate. I am also having him maintain his Vitamin D3, NON FORMULARY, aspirin, Iron, levocetirizine, and fluticasone.  No orders of the defined types were placed in this encounter.   CMA served as Education administrator during this visit. History, Physical and Plan performed by medical provider. Documentation and orders reviewed and attested to.  Penni Homans, MD

## 2016-08-07 NOTE — Assessment & Plan Note (Signed)
Has been cleared by cardiology after stress test and has had no recurrence. He will report if symptoms return

## 2016-10-29 DIAGNOSIS — D3132 Benign neoplasm of left choroid: Secondary | ICD-10-CM | POA: Diagnosis not present

## 2016-10-29 DIAGNOSIS — H259 Unspecified age-related cataract: Secondary | ICD-10-CM | POA: Diagnosis not present

## 2016-10-29 DIAGNOSIS — H04123 Dry eye syndrome of bilateral lacrimal glands: Secondary | ICD-10-CM | POA: Diagnosis not present

## 2016-12-04 DIAGNOSIS — K045 Chronic apical periodontitis: Secondary | ICD-10-CM | POA: Diagnosis not present

## 2016-12-26 ENCOUNTER — Ambulatory Visit (INDEPENDENT_AMBULATORY_CARE_PROVIDER_SITE_OTHER): Payer: Medicare HMO

## 2016-12-26 DIAGNOSIS — Z23 Encounter for immunization: Secondary | ICD-10-CM

## 2016-12-28 NOTE — Progress Notes (Signed)
Subjective:   Bradley Peterson is a 76 y.o. male who presents for Medicare Annual/Subsequent preventive examination.  Review of Systems:  No ROS.  Medicare Wellness Visit. Additional risk factors are reflected in the social history.  Cardiac Risk Factors include: advanced age (>32men, >63 women) Sleep patterns: Sleeps well 6-7 hrs. Naps occasionally.   Male:   CCS-  Last pt report 02/06/11 with 10 yr recall PSA-  Lab Results  Component Value Date   PSA 0.65 12/24/2013   PSA 0.59 12/16/2012       Objective:    Vitals: BP 112/78 (BP Location: Left Arm, Patient Position: Sitting, Cuff Size: Normal)   Pulse 60   Temp 98.1 F (36.7 C) (Oral)   Resp 18   Ht 5\' 8"  (1.727 m)   Wt 166 lb 3.2 oz (75.4 kg)   SpO2 97%   BMI 25.27 kg/m   Body mass index is 25.27 kg/m.  Tobacco Social History   Tobacco Use  Smoking Status Former Smoker  . Types: Pipe  . Start date: 02/19/1977  Smokeless Tobacco Never Used  Tobacco Comment   >25 years ago, pipe only.     Counseling given: Not Answered Comment: >25 years ago, pipe only.   Past Medical History:  Diagnosis Date  . Allergic state 12/27/2013  . Arthritis of neck   . Positive TB test    Past Surgical History:  Procedure Laterality Date  . DENTAL SURGERY    . TONSILLECTOMY     Late 35s   Family History  Problem Relation Age of Onset  . Heart disease Father        CABG at 39, died age 87  . Arthritis Father   . Arthritis Mother        Living 72  . Hypertension Mother   . Hyperlipidemia Mother   . 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 Cholecalciferol (VITAMIN D3) 2000 UNITS capsule Take 2,000 Units by mouth daily.  . Ferrous Sulfate (IRON) 90 (18 Fe) MG TABS Take 1 capsule by mouth.    . fluticasone (FLONASE) 50 MCG/ACT nasal spray Place 2 sprays into both nostrils daily.  Marland Kitchen levocetirizine (XYZAL) 5 MG tablet Take 1 tablet (5 mg total) by mouth every evening.  . NON FORMULARY as needed. OTC SINUS Medication.  Marland Kitchen aspirin 81 MG tablet Take 81 mg by mouth every other day.    No facility-administered encounter medications on file as of 01/03/2017.     Activities of Daily Living In your present state of health, do you have any difficulty performing the following activities: 01/03/2017  Hearing? N  Vision? N  Comment wearing glasses. [facility] yearly  Difficulty concentrating or making decisions? N  Walking or climbing stairs? N  Dressing or bathing? N  Doing errands, shopping? N  Preparing Food and eating ? N  Using the Toilet? N  In the past six months, have you accidently leaked urine? N  Do you have problems with loss of bowel control? N  Managing your Medications? N  Managing your Finances? N  Housekeeping or managing your Housekeeping? N  Some recent data might be hidden    Patient Care Team: Mosie Lukes, MD as PCP - General (Family Medicine)  Assessment:    Physical assessment deferred to PCP.  Exercise Activities and Dietary recommendations Current Exercise Habits: Home exercise routine, Time (Minutes): 30, Frequency (Times/Week): 5, Weekly Exercise (Minutes/Week): 150, Intensity: Mild Diet (meal preparation, eat out, water intake, caffeinated beverages, dairy products, fruits and vegetables): in general, a "healthy" diet        Goal: Drink at least 4 glasses of water per day.   Fall Risk Fall Risk  01/03/2017 01/03/2017 12/30/2015 12/28/2014 12/24/2013  Falls in the past year? No No No No No   Depression Screen PHQ 2/9 Scores 01/03/2017 01/03/2017 12/30/2015 12/28/2014  PHQ - 2 Score 0 0 0 0    Cognitive Function MMSE - Mini Mental State Exam 01/03/2017  Orientation to time 5  Orientation to Place 5  Registration 3  Attention/ Calculation  5  Recall 3  Language- name 2 objects 2  Language- repeat 1  Language- follow 3 step command 3  Language- read & follow direction 1  Write a sentence 1  Copy design 1  Total score 30        Immunization History  Administered Date(s) Administered  . Influenza, High Dose Seasonal PF 12/23/2012, 12/30/2015, 12/26/2016  . Influenza,inj,Quad PF,6+ Mos 12/24/2013, 12/28/2014  . Pneumococcal Conjugate-13 12/24/2013  . Pneumococcal Polysaccharide-23 12/30/2015  . Tdap 12/28/2014  . Zoster 02/20/2012   Screening Tests Health Maintenance  Topic Date Due  . COLONOSCOPY  02/05/2021  . TETANUS/TDAP  12/27/2024  . INFLUENZA VACCINE  Completed  . PNA vac Low Risk Adult  Completed      Plan:   Follow up with PCP as directed  Continue to eat heart healthy diet (full of fruits, vegetables, whole grains, lean protein, water--limit salt, fat, and sugar intake) and increase physical activity as tolerated.  Continue doing brain stimulating activities (puzzles, reading, adult coloring books, staying active) to keep memory sharp.   Bring a copy of your living will and/or healthcare power of attorney to your next office visit.  I have personally reviewed and noted the following in the patient's chart:   . Medical and social history . Use of alcohol, tobacco or illicit drugs  . Current medications and supplements . Functional ability and status . Nutritional status . Physical activity . Advanced directives . List of other physicians . Hospitalizations, surgeries, and ER visits in previous 12 months . Vitals . Screenings to include cognitive, depression, and falls . Referrals and appointments  In addition, I have reviewed and discussed with patient certain preventive protocols, quality metrics, and best practice recommendations. A written personalized care plan for preventive services as well as general preventive health recommendations were provided to patient.     Shela Nevin, [place]  01/03/2017

## 2017-01-03 ENCOUNTER — Ambulatory Visit: Payer: Medicare HMO | Admitting: *Deleted

## 2017-01-03 ENCOUNTER — Ambulatory Visit (INDEPENDENT_AMBULATORY_CARE_PROVIDER_SITE_OTHER): Payer: Medicare HMO | Admitting: Family Medicine

## 2017-01-03 ENCOUNTER — Encounter: Payer: Self-pay | Admitting: Family Medicine

## 2017-01-03 VITALS — BP 112/78 | HR 60 | Temp 98.1°F | Resp 18 | Ht 68.0 in | Wt 166.2 lb

## 2017-01-03 DIAGNOSIS — R0789 Other chest pain: Secondary | ICD-10-CM

## 2017-01-03 DIAGNOSIS — D649 Anemia, unspecified: Secondary | ICD-10-CM | POA: Diagnosis not present

## 2017-01-03 DIAGNOSIS — Z0000 Encounter for general adult medical examination without abnormal findings: Secondary | ICD-10-CM | POA: Diagnosis not present

## 2017-01-03 LAB — CBC
HCT: 43.5 % (ref 39.0–52.0)
HEMOGLOBIN: 14.8 g/dL (ref 13.0–17.0)
MCHC: 34 g/dL (ref 30.0–36.0)
MCV: 92.8 fl (ref 78.0–100.0)
Platelets: 240 10*3/uL (ref 150.0–400.0)
RBC: 4.68 Mil/uL (ref 4.22–5.81)
RDW: 13.9 % (ref 11.5–15.5)
WBC: 5.3 10*3/uL (ref 4.0–10.5)

## 2017-01-03 LAB — COMPREHENSIVE METABOLIC PANEL
ALK PHOS: 68 U/L (ref 39–117)
ALT: 17 U/L (ref 0–53)
AST: 21 U/L (ref 0–37)
Albumin: 4.4 g/dL (ref 3.5–5.2)
BILIRUBIN TOTAL: 0.7 mg/dL (ref 0.2–1.2)
BUN: 14 mg/dL (ref 6–23)
CO2: 29 mEq/L (ref 19–32)
Calcium: 9.9 mg/dL (ref 8.4–10.5)
Chloride: 105 mEq/L (ref 96–112)
Creatinine, Ser: 0.87 mg/dL (ref 0.40–1.50)
GFR: 90.86 mL/min (ref >60.00)
GLUCOSE: 94 mg/dL (ref 70–99)
Potassium: 4.2 mEq/L (ref 3.5–5.1)
SODIUM: 141 meq/L (ref 135–145)
TOTAL PROTEIN: 7.3 g/dL (ref 6.0–8.3)

## 2017-01-03 LAB — TSH: TSH: 0.84 u[IU]/mL (ref 0.35–4.50)

## 2017-01-03 NOTE — Assessment & Plan Note (Signed)
Check cbc and cmp 

## 2017-01-03 NOTE — Assessment & Plan Note (Signed)
Increase leafy greens, consider increased lean red meat and using cast iron cookware. Continue to monitor, report any concern

## 2017-01-03 NOTE — Patient Instructions (Addendum)
Bring [place] copy of advanced Directives 64 oz  Fluids daily Call dermatology for annual   Continue to eat heart healthy diet (full of fruits, vegetables, whole grains, lean protein, water--limit salt, fat, and sugar intake) and increase physical activity as tolerated.  Continue doing brain stimulating activities (puzzles, reading, adult coloring books, staying active) to keep memory sharp.   Bring a copy of your living will and/or healthcare power of attorney to your next office visit.  Preventive Care 78 Years and Older, Male Preventive care refers to lifestyle choices and visits with your health care provider that can promote health and wellness. What does preventive care include?  A yearly physical exam. This is also called an annual well check.  Dental exams once or twice a year.  Routine eye exams. Ask your health care provider how often you should have your eyes checked.  Personal lifestyle choices, including: ? Daily care of your teeth and gums. ? Regular physical activity. ? Eating a healthy diet. ? Avoiding tobacco and drug use. ? Limiting alcohol use. ? Practicing safe sex. ? Taking low doses of aspirin every day. ? Taking vitamin and mineral supplements as recommended by your health care provider. What happens during an annual well check? The services and screenings done by your health care provider during your annual well check will depend on your age, overall health, lifestyle risk factors, and family history of disease. Counseling Your health care provider may ask you questions about your:  Alcohol use.  Tobacco use.  Drug use.  Emotional well-being.  Home and relationship well-being.  Sexual activity.  Eating habits.  History of falls.  Memory and ability to understand (cognition).  Work and work Statistician.  Screening You may have the following tests or measurements:  Height, weight, and BMI.  Blood pressure.  Lipid and cholesterol levels.  These may be checked every 5 years, or more frequently if you are over 13 years old.  Skin check.  Lung cancer screening. You may have this screening every year starting at age 71 if you have a 30-pack-year history of smoking and currently smoke or have quit within the past 15 years.  Fecal occult blood test (FOBT) of the stool. You may have this test every year starting at age 67.  Flexible sigmoidoscopy or colonoscopy. You may have a sigmoidoscopy every 5 years or a colonoscopy every 10 years starting at age 40.  Prostate cancer screening. Recommendations will vary depending on your family history and other risks.  Hepatitis C blood test.  Hepatitis B blood test.  Sexually transmitted disease (STD) testing.  Diabetes screening. This is done by checking your blood sugar (glucose) after you have not eaten for a while (fasting). You may have this done every 1-3 years.  Abdominal aortic aneurysm (AAA) screening. You may need this if you are a current or former smoker.  Osteoporosis. You may be screened starting at age 18 if you are at high risk.  Talk with your health care provider about your test results, treatment options, and if necessary, the need for more tests. Vaccines Your health care provider may recommend certain vaccines, such as:  Influenza vaccine. This is recommended every year.  Tetanus, diphtheria, and acellular pertussis (Tdap, Td) vaccine. You may need a Td booster every 10 years.  Varicella vaccine. You may need this if you have not been vaccinated.  Zoster vaccine. You may need this after age 26.  Measles, mumps, and rubella (MMR) vaccine. You may need at  least one dose of MMR if you were born in 1957 or later. You may also need a second dose.  Pneumococcal 13-valent conjugate (PCV13) vaccine. One dose is recommended after age 65.  Pneumococcal polysaccharide (PPSV23) vaccine. One dose is recommended after age 65.  Meningococcal vaccine. You may need this  if you have certain conditions.  Hepatitis A vaccine. You may need this if you have certain conditions or if you travel or work in places where you may be exposed to hepatitis A.  Hepatitis B vaccine. You may need this if you have certain conditions or if you travel or work in places where you may be exposed to hepatitis B.  Haemophilus influenzae type b (Hib) vaccine. You may need this if you have certain risk factors.  Talk to your health care provider about which screenings and vaccines you need and how often you need them. This information is not intended to replace advice given to you by your health care provider. Make sure you discuss any questions you have with your health care provider. Document Released: 03/04/2015 Document Revised: 10/26/2015 Document Reviewed: 12/07/2014 Elsevier Interactive Patient Education  2017 [employer]   Mr. Paulsen , Thank you for taking time to come for your Medicare Wellness Visit. I appreciate your ongoing commitment to your health goals. Please review the following plan we discussed and let me know if I can assist you in the future.   These are the goals we discussed: Drink at least 4 glasses of water per day.    This is a list of the screening recommended for you and due dates:  Health Maintenance  Topic Date Due  . Colon Cancer Screening  02/05/2021  . Tetanus Vaccine  12/27/2024  . Flu Shot  Completed  . Pneumonia vaccines  Completed     

## 2017-01-06 DIAGNOSIS — Z0000 Encounter for general adult medical examination without abnormal findings: Secondary | ICD-10-CM | POA: Insufficient documentation

## 2017-01-06 NOTE — Assessment & Plan Note (Signed)
Patient encouraged to maintain heart healthy diet, regular exercise, adequate sleep. Consider daily probiotics. Take medications as prescribed. Labs ordered and reviewed 

## 2017-01-06 NOTE — Progress Notes (Signed)
Patient ID: Bradley Peterson, male   DOB: 15-May-1940, 76 y.o.   MRN: [id number]   Subjective:    Patient ID: Bradley Peterson, male    DOB: 1940/10/30, 76 y.o.   MRN: [id number]  Chief Complaint  Patient presents with  . Medicare Wellness    with RN    HPI Patient is in today for annual preventative exam. He feels well today. No recent febrile illness or acute hospitalizations. He is eating a heart healthy diet most days and stays active. Is doing well with activities of daily living at home. Denies CP/palp/SOB/HA/congestion/fevers/GI or GU c/o. Taking meds as prescribed  Past Medical History:  Diagnosis Date  . Allergic state 12/27/2013  . Arthritis of neck   . Positive TB test     Past Surgical History:  Procedure Laterality Date  . DENTAL SURGERY    . TONSILLECTOMY     Late 10s    Family History  Problem Relation Age of Onset  . Heart disease Father        CABG at 70, died age 82  . Arthritis Father   . Arthritis Mother        Living 70  . Hypertension Mother   . Hyperlipidemia Mother   . Drug abuse Son     Social History   Socioeconomic History  . Marital status: Married    Spouse name: Not on file  . Number of children: 3  . Years of education: Not on file  . Highest education level: Not on file  Social Needs  . Financial resource strain: Not on file  . Food insecurity - worry: Not on file  . Food insecurity - inability: Not on file  . Transportation needs - medical: Not on file  . Transportation needs - non-medical: Not on file  Occupational History  . Not on file  Tobacco Use  . Smoking status: Former Smoker    Types: Pipe    Start date: 02/19/1977  . Smokeless tobacco: Never Used  . Tobacco comment: >25 years ago, pipe only.  Substance and Sexual Activity  . Alcohol use: Yes    Comment: Maybe 1/2 drink a week  . Drug use: No  . Sexual activity: Yes    Comment: lives with wife, retired from education, [place], no major dietary  restrictions  Other Topics Concern  . 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 Cholecalciferol (VITAMIN D3) 2000 UNITS capsule Take 2,000 Units by mouth daily.    . Ferrous Sulfate (IRON) 90 (18 Fe) MG TABS Take 1 capsule by mouth.    . fluticasone (FLONASE) 50 MCG/ACT nasal spray Place 2 sprays into both nostrils daily. 16 g 3  . levocetirizine (XYZAL) 5 MG tablet Take 1 tablet (5 mg total) by mouth every evening. 30 tablet 3  . NON FORMULARY as needed. OTC SINUS Medication.    Marland Kitchen aspirin 81 MG tablet Take 81 mg by mouth every other day.      No facility-administered medications prior to visit.     No Known Allergies  Review of Systems  Constitutional: Negative for fever and malaise/fatigue.  HENT: Negative for congestion.   Eyes: Negative for blurred vision.  Respiratory: Negative for shortness of breath.   Cardiovascular: Negative for chest pain, palpitations and leg  swelling.  Gastrointestinal: Negative for abdominal pain, blood in stool and nausea.  Genitourinary: Negative for dysuria and frequency.  Musculoskeletal: Negative for falls.  Skin: Negative for rash.  Neurological: Negative for dizziness, loss of consciousness and headaches.  Endo/Heme/Allergies: Negative for environmental allergies.  Psychiatric/Behavioral: Negative for depression. The patient is not nervous/anxious.        Objective:    Physical Exam  Constitutional: He is oriented to person, place, and time. He appears well-developed and well-nourished. No distress.  HENT:  Head: Normocephalic and atraumatic.  Eyes: Conjunctivae are normal.  Neck: Neck supple. No thyromegaly present.  Cardiovascular: Normal rate, regular rhythm and normal heart sounds.  No murmur heard. Pulmonary/Chest: Effort normal and breath sounds normal. No respiratory  distress. He has no wheezes.  Abdominal: Soft. Bowel sounds are normal. He exhibits no mass. There is no tenderness.  Musculoskeletal: He exhibits no edema.  Lymphadenopathy:    He has no cervical adenopathy.  Neurological: He is alert and oriented to person, place, and time.  Skin: Skin is warm and dry.  Psychiatric: He has a normal mood and affect. His behavior is normal.    BP 112/78 (BP Location: Left Arm, Patient Position: Sitting, Cuff Size: Normal)   Pulse 60   Temp 98.1 F (36.7 C) (Oral)   Resp 18   Ht 5\' 8"  (1.727 m)   Wt 166 lb 3.2 oz (75.4 kg)   SpO2 97%   BMI 25.27 kg/m  Wt Readings from Last 3 Encounters:  01/03/17 166 lb 3.2 oz (75.4 kg)  08/07/16 162 lb 4.8 oz (73.6 kg)  06/15/16 165 lb 6.4 oz (75 kg)     Lab Results  Component Value Date   WBC 5.3 01/03/2017   HGB 14.8 01/03/2017   HCT 43.5 01/03/2017   PLT 240.0 01/03/2017   GLUCOSE 94 01/03/2017   CHOL 170 02/03/2016   TRIG 140 02/03/2016   HDL 49 02/03/2016   LDLCALC 93 02/03/2016   ALT 17 01/03/2017   AST 21 01/03/2017   NA 141 01/03/2017   K 4.2 01/03/2017   CL 105 01/03/2017   CREATININE 0.87 01/03/2017   BUN 14 01/03/2017   CO2 29 01/03/2017   TSH 0.84 01/03/2017   PSA 0.65 12/24/2013    Lab Results  Component Value Date   TSH 0.84 01/03/2017   Lab Results  Component Value Date   WBC 5.3 01/03/2017   HGB 14.8 01/03/2017   HCT 43.5 01/03/2017   MCV 92.8 01/03/2017   PLT 240.0 01/03/2017   Lab Results  Component Value Date   NA 141 01/03/2017   K 4.2 01/03/2017   CO2 29 01/03/2017   GLUCOSE 94 01/03/2017   BUN 14 01/03/2017   CREATININE 0.87 01/03/2017   BILITOT 0.7 01/03/2017   ALKPHOS 68 01/03/2017   AST 21 01/03/2017   ALT 17 01/03/2017   PROT 7.3 01/03/2017   ALBUMIN 4.4 01/03/2017   CALCIUM 9.9 01/03/2017   ANIONGAP 6 01/31/2016   GFR 90.86 01/03/2017   Lab Results  Component Value Date   CHOL 170 02/03/2016   Lab Results  Component Value Date   HDL 49  02/03/2016   Lab Results  Component Value Date   LDLCALC 93 02/03/2016   Lab Results  Component Value Date   TRIG 140 02/03/2016   Lab Results  Component Value Date   CHOLHDL 3.5 02/03/2016   No results found for: HGBA1C     Assessment & Plan:  Problem List Items Addressed This Visit    Atypical chest pain    Check cbc and cmp      Relevant Orders   CBC (Completed)   Comprehensive metabolic panel (Completed)   TSH (Completed)   Anemia    Increase leafy greens, consider increased lean red meat and using cast iron cookware. Continue to monitor, report any concern      Relevant Orders   CBC (Completed)   Comprehensive metabolic panel (Completed)   TSH (Completed)   Preventative health care    Patient encouraged to maintain heart healthy diet, regular exercise, adequate sleep. Consider daily probiotics. Take medications as prescribed. Labs ordered and reviewed.        Other Visit Diagnoses    Encounter for Medicare annual wellness exam    -  Primary      I am having Bradley Peterson "Jim" maintain his Vitamin D3, NON FORMULARY, aspirin, Iron, levocetirizine, and fluticasone.  No orders of the defined types were placed in this encounter.   Penni Homans, MD

## 2017-01-07 ENCOUNTER — Ambulatory Visit: Payer: Medicare HMO | Admitting: *Deleted

## 2017-03-04 IMAGING — CR DG CHEST 2V
2 series · 2 of 2 positions shown · non-contrast
Comparison: 02/25/2012.

CLINICAL DATA: Chest pain.

EXAM:
CHEST  2 VIEW

[w chest pa]
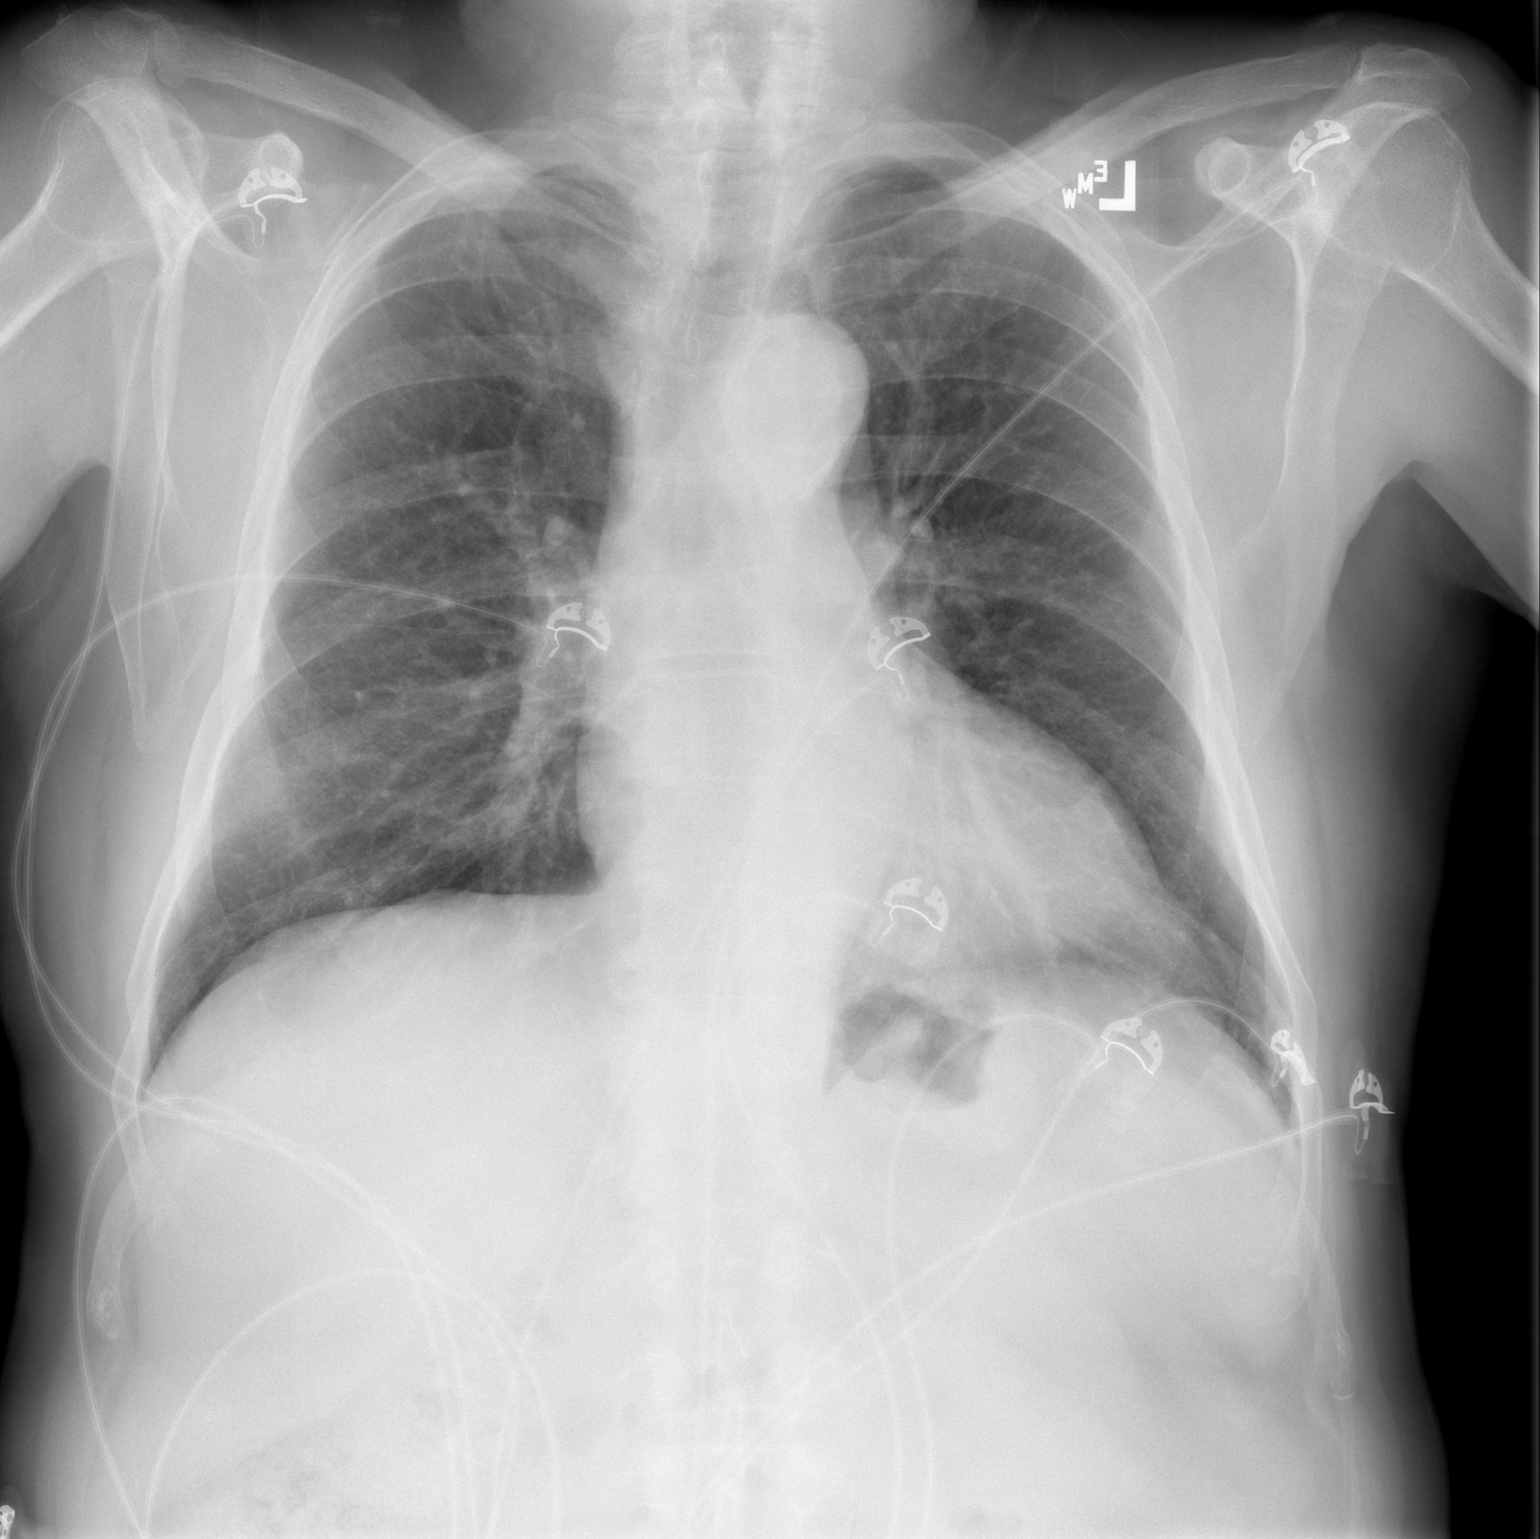

[w chest lat]
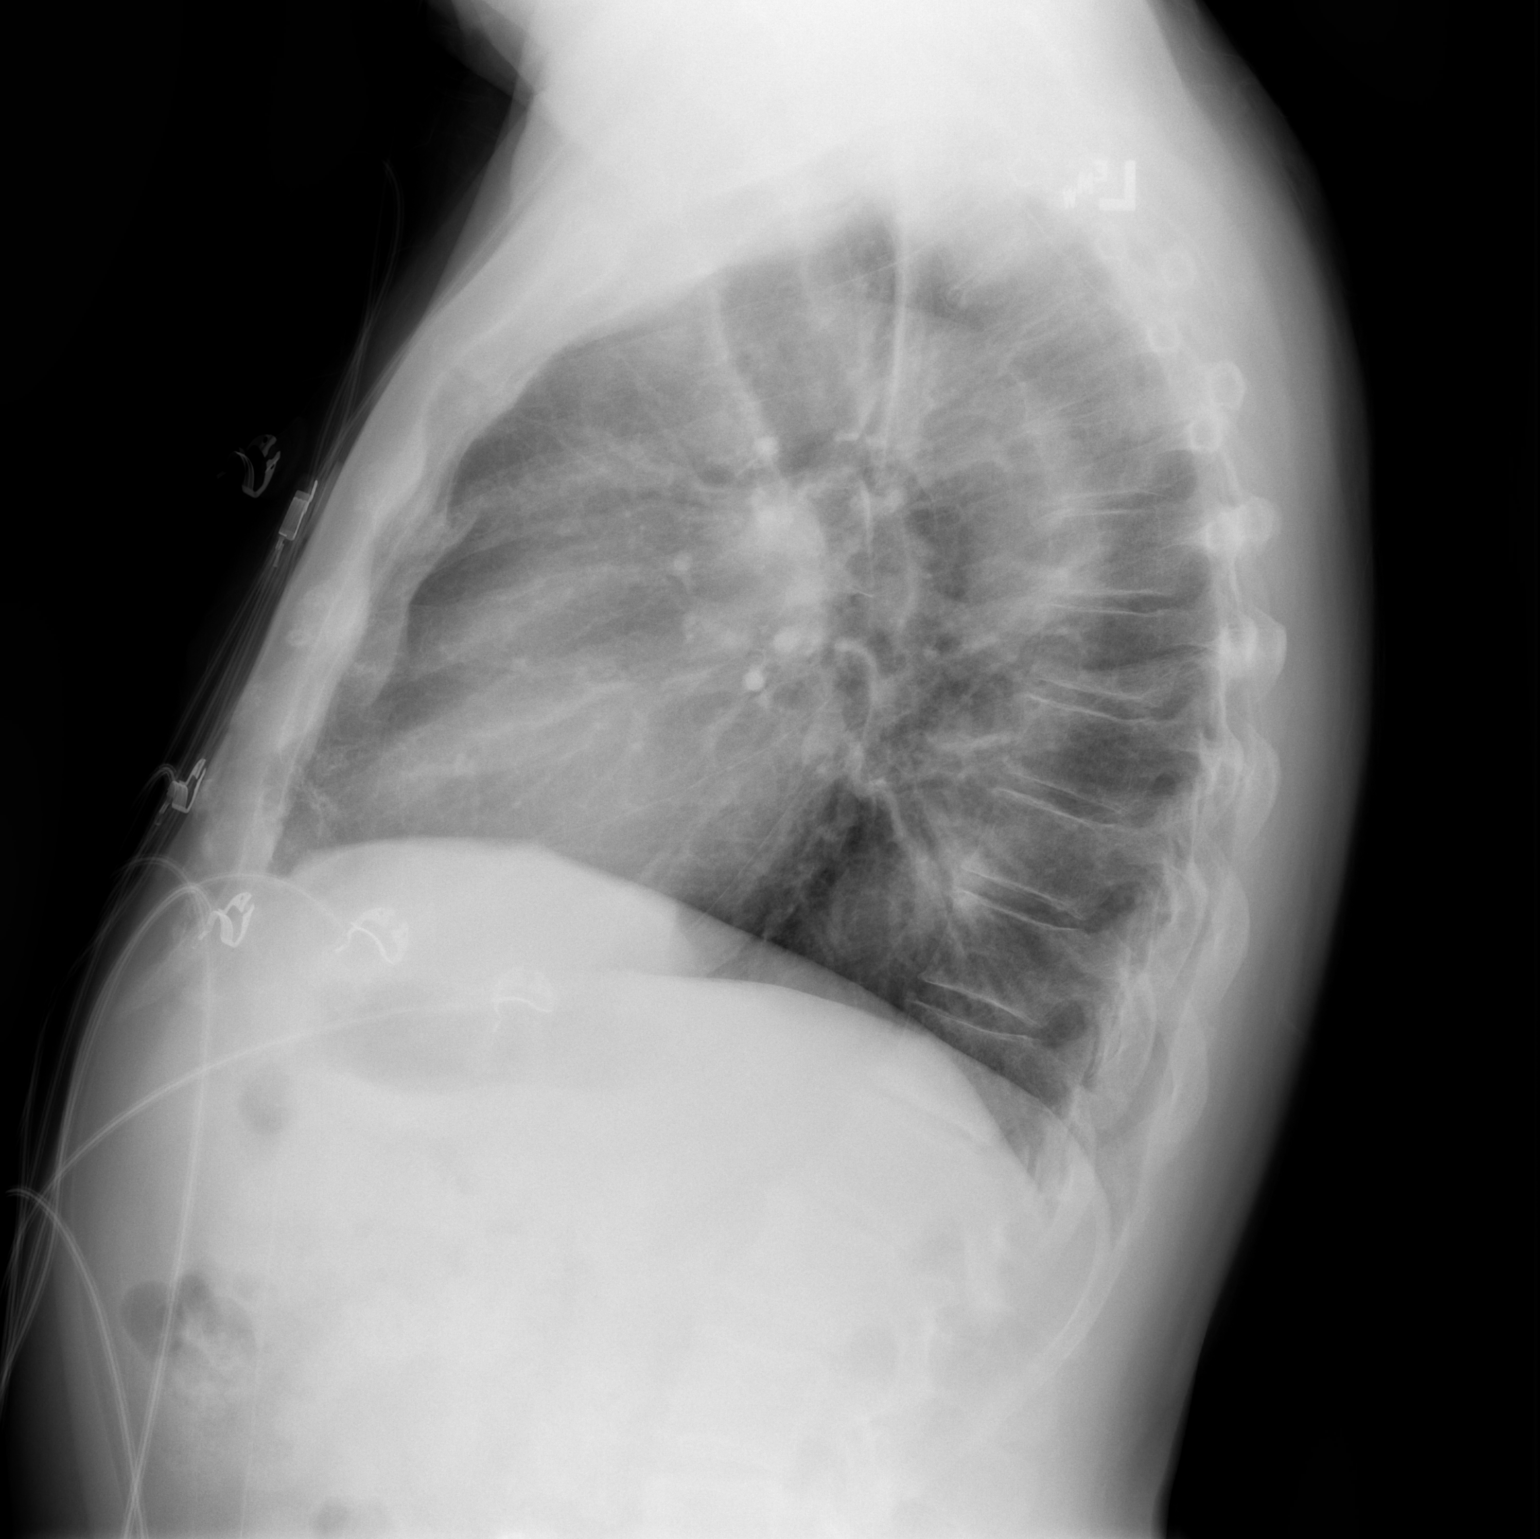

[2 of 2 positions shown; findings below may reference images not displayed]

FINDINGS: Mediastinum and hilar structures normal. Lungs are clear. No pleural
effusion or pneumothorax. Stable cardiomegaly.
IMPRESSION: No acute cardiopulmonary disease.

## 2017-05-02 ENCOUNTER — Ambulatory Visit: Payer: Medicare HMO | Admitting: Medical

## 2017-05-02 ENCOUNTER — Ambulatory Visit (HOSPITAL_BASED_OUTPATIENT_CLINIC_OR_DEPARTMENT_OTHER)
Admission: RE | Admit: 2017-05-02 | Discharge: 2017-05-02 | Disposition: A | Discharge status: Home / Self Care | Payer: Medicare HMO | Source: Ambulatory Visit | Attending: Medical | Admitting: Medical

## 2017-05-02 ENCOUNTER — Other Ambulatory Visit: Payer: Self-pay | Admitting: Medical

## 2017-05-02 ENCOUNTER — Encounter: Payer: Self-pay | Admitting: Medical

## 2017-05-02 VITALS — BP 115/64 | HR 63 | Resp 16 | Ht 68.0 in | Wt 168.4 lb

## 2017-05-02 DIAGNOSIS — M25561 Pain in right knee: Secondary | ICD-10-CM

## 2017-05-02 MED ORDER — DICLOFENAC SODIUM 75 MG PO TBEC: 75.0000 mg | DELAYED_RELEASE_TABLET | Freq: Two times a day (BID) | ORAL | 0 refills | Status: DC

## 2017-05-02 NOTE — Progress Notes (Signed)
Subjective:    Patient ID: Bradley Peterson, male    DOB: 1940-08-29, 77 y.o.   MRN: [id number]  HPI  Pt has some rt  knee pain for past 2 weeks. No injury or fall. Pt walks for 3-4 miles a day. Doing that for past 10 years. No new exercise. Pain is mild. But states rare high level depends on if twists or turns wrong way. That pain will be for 2 second sharp and then subside. But most of the time dull low level pain.  Pt states pain is not getting better. He states gradually getting worse.  Pt has not been taking anything except rare occasional ibuprofen. Did decrease pain briefly.    Review of Systems  Constitutional: Negative for chills, fatigue and fever.  HENT: Negative for dental problem and ear discharge.   Respiratory: Negative for cough, chest tightness and shortness of breath.   Cardiovascular: Negative for chest pain and palpitations.  Gastrointestinal: Negative for abdominal pain.  Musculoskeletal:       Rt knee pain.  Skin: Negative for rash.  Hematological: Negative for adenopathy. Does not bruise/bleed easily.  Psychiatric/Behavioral: Negative for behavioral problems, confusion and dysphoric mood. The patient is not nervous/anxious and is not hyperactive.     Past Medical History:  Diagnosis Date  . Allergic state 12/27/2013  . Arthritis of neck   . Positive TB test      Social History   Socioeconomic History  . Marital status: Married    Spouse name: Not on file  . Number of children: 3  . Years of education: Not on file  . Highest education level: Not on file  Social Needs  . Financial resource strain: Not on file  . Food insecurity - worry: Not on file  . Food insecurity - inability: Not on file  . Transportation needs - medical: Not on file  . Transportation needs - non-medical: Not on file  Occupational History  . Not on file  Tobacco Use  . Smoking status: Former Smoker    Types: Pipe    Start date: 02/19/1977  . Smokeless tobacco: Never Used    . Tobacco comment: >25 years ago, pipe only.  Substance and Sexual Activity  . Alcohol use: Yes    Comment: Maybe 1/2 drink a week  . Drug use: No  . Sexual activity: Yes    Comment: lives with wife, retired from education, [place], no major dietary restrictions  Other Topics Concern  . 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 DENTAL SURGERY    . TONSILLECTOMY     Late 11s    Family History  Problem Relation Age of Onset  . Heart disease Father        CABG at 47, died age 35  . Arthritis Father   . Arthritis Mother        Living 69  . Hypertension Mother   . Hyperlipidemia Mother   . Drug abuse Son     No Known Allergies  Current Outpatient Medications on File Prior to Visit  Medication Sig Dispense Refill  . Cholecalciferol (VITAMIN D3) 2000 UNITS capsule Take 2,000 Units by mouth daily.    . Ferrous Sulfate (IRON) 90 (18 Fe) MG TABS Take 1 capsule by mouth.    . fluticasone (FLONASE)  50 MCG/ACT nasal spray Place 2 sprays into both nostrils daily. (Patient taking differently: Place 2 sprays into both nostrils as needed. ) 16 g 3  . levocetirizine (XYZAL) 5 MG tablet Take 1 tablet (5 mg total) by mouth every evening. (Patient taking differently: Take 5 mg by mouth as needed. ) 30 tablet 3  . NON FORMULARY as needed. OTC SINUS Medication.     No current facility-administered medications on file prior to visit.     BP 115/64 (BP Location: Left Arm, Patient Position: Sitting, Cuff Size: Normal)   Pulse 63   Resp 16   Ht 5\' 8"  (1.727 m)   Wt 168 lb 6.4 oz (76.4 kg)   SpO2 99%   BMI 25.61 kg/m        Objective:   Physical Exam   General- No acute distress. Pleasant patient.  Lungs- Clear, even and unlabored. Heart- regular rate and rhythm. Neurologic- CNII- XII grossly intact.  Rt knee- some pain on  palpation of medial aspecy to knee. But not swollen. No pain or crepitus on range of motion.      Assessment & Plan:  For your recent right knee pain, I am placing x-ray order.  Please get that done today.  In addition, I am prescribing diclofenac to use if needed.  I think this would give you better pain relief than ibuprofen.  Both diclofenac and ibuprofen are in the same class of medication so please stop ibuprofen.  In light of the fact that you are very active and the pain is not been improving over the past 2 weeks, I do want to go ahead and refer you to sports medicine.  That referral has already been placed.  If you do not get a call from them by Tuesday or Wednesday next week and you could call them or call our office and asked to speak to [facility] for update on referral.  Follow-up with our office as needed or his regular schedule with your PCP.  Mackie Pai, PA-C

## 2017-05-02 NOTE — Patient Instructions (Signed)
For your recent right knee pain, I am placing x-ray order.  Please get that done today.  In addition, I am prescribing diclofenac to use if needed.  I think this would give you better pain relief than ibuprofen.  Both diclofenac and ibuprofen are in the same class of medication so please stop ibuprofen.  In light of the fact that you are very active and the pain is not been improving over the past 2 weeks, I do want to go ahead and refer you to sports medicine.  That referral has already been placed.  If you do not get a call from them by Tuesday or Wednesday next week and you could call them or call our office and asked to speak to [facility] for update on referral.  Follow-up with our office as needed or his regular schedule with your PCP.

## 2017-05-03 ENCOUNTER — Encounter: Payer: Self-pay | Admitting: Family Medicine

## 2017-05-03 ENCOUNTER — Ambulatory Visit: Payer: Medicare HMO | Admitting: Family Medicine

## 2017-05-03 DIAGNOSIS — M25561 Pain in right knee: Secondary | ICD-10-CM

## 2017-05-03 NOTE — Patient Instructions (Signed)
Your pain is due to arthritis. These are the different medications you can take for this: Tylenol 500mg  1-2 tabs three times a day for pain. Capsaicin, aspercreme, or biofreeze topically up to four times a day may also help with pain. Some supplements that may help for arthritis: Boswellia extract, curcumin, pycnogenol Diclofenac as prescribed by Percell Miller OR Aleve 1-2 tabs twice a day with food only if needed beyond the above. Cortisone injections are an option. If cortisone injections do not help, there are different types of shots that may help but they take longer to take effect (gel shots). It's important that you continue to stay active. Straight leg raises, knee extensions 3 sets of 10 once a day (add ankle weight if these become too easy). Consider physical therapy to strengthen muscles around the joint that hurts to take pressure off of the joint itself. Shoe inserts with good arch support may be helpful. Heat or ice 15 minutes at a time 3-4 times a day as needed to help with pain. Water aerobics and cycling with low resistance are the best two types of exercise for arthritis though any exercise is ok as long as it doesn't worsen the pain. Follow up with me in 1 month or as needed if you're doing well.

## 2017-05-04 ENCOUNTER — Encounter: Payer: Self-pay | Admitting: Family Medicine

## 2017-05-04 DIAGNOSIS — M25561 Pain in right knee: Secondary | ICD-10-CM | POA: Insufficient documentation

## 2017-05-04 NOTE — Progress Notes (Signed)
PCP: Mosie Lukes, MD  Subjective:   HPI: Patient is a 77 y.o. male here for right knee pain.  Patient reports for about 2 weeks he's had medial right knee pain. Pain level 0/10 at rest but up to 2/10 by end of day, a discomfort. Had been off and on previously but more consistent past couple weeks. Has not started diclofenac yet. No injury or trauma. No skin changes, numbness.  Past Medical History:  Diagnosis Date  . Allergic state 12/27/2013  . Arthritis of neck   . Positive TB test     Current Outpatient Medications on File Prior to Visit  Medication Sig Dispense Refill  . Cholecalciferol (VITAMIN D3) 2000 UNITS capsule Take 2,000 Units by mouth daily.    . diclofenac (VOLTAREN) 75 MG EC tablet Take 1 tablet (75 mg total) by mouth 2 (two) times daily. 15 tablet 0  . Ferrous Sulfate (IRON) 90 (18 Fe) MG TABS Take 1 capsule by mouth.    . fluticasone (FLONASE) 50 MCG/ACT nasal spray Place 2 sprays into both nostrils daily. (Patient taking differently: Place 2 sprays into both nostrils as needed. ) 16 g 3  . levocetirizine (XYZAL) 5 MG tablet Take 1 tablet (5 mg total) by mouth every evening. (Patient taking differently: Take 5 mg by mouth as needed. ) 30 tablet 3  . NON FORMULARY as needed. OTC SINUS Medication.     No current facility-administered medications on file prior to visit.     Past Surgical History:  Procedure Laterality Date  . DENTAL SURGERY    . TONSILLECTOMY     Late 1940s    No Known Allergies  Social History   Socioeconomic History  . Marital status: Married    Spouse name: Not on file  . Number of children: 3  . Years of education: Not on file  . Highest education level: Not on file  Social Needs  . Financial resource strain: Not on file  . Food insecurity - worry: Not on file  . Food insecurity - inability: Not on file  . Transportation needs - medical: Not on file  . Transportation needs - non-medical: Not on file  Occupational History   . Not on file  Tobacco Use  . Smoking status: Former Smoker    Types: Pipe    Start date: 02/19/1977  . Smokeless tobacco: Never Used  . Tobacco comment: >25 years ago, pipe only.  Substance and Sexual Activity  . Alcohol use: Yes    Comment: Maybe 1/2 drink a week  . Drug use: No  . Sexual activity: Yes    Comment: lives with wife, retired from education, [place], no major dietary restrictions  Other Topics Concern  . 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 Heart disease Father        CABG at 23, died age 36  . Arthritis Father   . Arthritis Mother        Living 61  . Hypertension Mother   . Hyperlipidemia Mother   . Drug abuse Son     BP 123/88   Pulse 60   Ht 5\' 8"  (1.727 m)   Wt 168 lb (76.2 kg)   BMI 25.54 kg/m   Review of Systems: See HPI above.     Objective:  Physical Exam:  Gen: NAD, comfortable in exam room  Right knee: No gross deformity, ecchymoses, swelling. Mild TTP medial joint line.  No other tenderness FROM with 5/5 strength. Negative ant/post drawers. Negative valgus/varus testing. Negative lachmanns. Negative mcmurrays, apleys, patellar apprehension. NV intact distally.  Left knee: No deformity. FROM with 5/5 strength. No tenderness to palpation. NVI distally.   Assessment & Plan:  1. 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  Independently reviewed radiographs and noted mild arthritis though we discussed possible worse than imaging as these were not weight bearing.  Regardless, this would not change management.  Tylenol, topical medications, supplements reviewed.  Diclofenac or aleve.  Consider injection.  Shown home exercises to do daily.  Heat or ice.  F/u in 1 month or prn.

## 2017-05-04 NOTE — Assessment & Plan Note (Signed)
consistent with DJD.  Independently reviewed radiographs and noted mild arthritis though we discussed possible worse than imaging as these were not weight bearing.  Regardless, this would not change management.  Tylenol, topical medications, supplements reviewed.  Diclofenac or aleve.  Consider injection.  Shown home exercises to do daily.  Heat or ice.  F/u in 1 month or prn.

## 2017-06-18 ENCOUNTER — Encounter: Payer: Self-pay | Admitting: Family Medicine

## 2017-06-18 ENCOUNTER — Ambulatory Visit: Payer: Medicare HMO | Admitting: Family Medicine

## 2017-06-18 DIAGNOSIS — M25562 Pain in left knee: Secondary | ICD-10-CM | POA: Diagnosis not present

## 2017-06-18 NOTE — Patient Instructions (Signed)
Your pain is due to arthritis. These are the different medications you can take for this: Tylenol 500mg  1-2 tabs three times a day for pain. Capsaicin, aspercreme, or biofreeze topically up to four times a day may also help with pain. Some supplements that may help for arthritis: Boswellia extract, curcumin, pycnogenol Ibuprofen 600mg  three times a day with food as needed for pain and inflammation. Cortisone injections are an option - call me if you're not getting better and want to go ahead with this. It's important that you continue to stay active. Straight leg raises, knee extensions 3 sets of 10 once a day (add ankle weight if these become too easy). Consider physical therapy to strengthen muscles around the joint that hurts to take pressure off of the joint itself. Shoe inserts with good arch support may be helpful. Ice 15 minutes at a time 3-4 times a day as needed to help with pain. Water aerobics and cycling with low resistance are the best two types of exercise for arthritis though any exercise is ok as long as it doesn't worsen the pain. Follow up with me in 1 month or as needed if you're doing well.

## 2017-06-19 ENCOUNTER — Encounter: Payer: Self-pay | Admitting: Family Medicine

## 2017-06-19 DIAGNOSIS — M25562 Pain in left knee: Secondary | ICD-10-CM | POA: Insufficient documentation

## 2017-06-19 DIAGNOSIS — M179 Osteoarthritis of knee, unspecified: Secondary | ICD-10-CM | POA: Insufficient documentation

## 2017-06-19 NOTE — Assessment & Plan Note (Signed)
consistent with flare of arthritis and synovitis.  We discussed Tylenol, topical medications, ibuprofen, supplements that might help.  He is going to consider cortisone injection if this does not continue to improve over the next week or 2.  Icing 15 minutes at a time 3-4 times a day.  Shown home exercises as well.  Follow-up in 1 month or as needed if doing well.

## 2017-06-19 NOTE — Progress Notes (Signed)
PCP: Mosie Lukes, MD  Subjective:   HPI: Patient is a 77 y.o. male here for left knee pain.  3/15: Patient reports for about 2 weeks he's had medial right knee pain. Pain level 0/10 at rest but up to 2/10 by end of day, a discomfort. Had been off and on previously but more consistent past couple weeks. Has not started diclofenac yet. No injury or trauma. No skin changes, numbness.  4/30: Patient reports his left knee has been hurting him for about 3 weeks now. Pain level is a 3 out of 10 but up to 8 out of 10 at times. Was really bad last week. Pain is sharp and yesterday was terrible. Some swelling and pain anteriorly. Is tried heat occasionally and sometimes ice.  Not tried anything else for this. No skin changes or numbness.  Past Medical History:  Diagnosis Date  . Allergic state 12/27/2013  . Arthritis of neck   . Positive TB test     Current Outpatient Medications on File Prior to Visit  Medication Sig Dispense Refill  . Cholecalciferol (VITAMIN D3) 2000 UNITS capsule Take 2,000 Units by mouth daily.    . diclofenac (VOLTAREN) 75 MG EC tablet Take 1 tablet (75 mg total) by mouth 2 (two) times daily. 15 tablet 0  . Ferrous Sulfate (IRON) 90 (18 Fe) MG TABS Take 1 capsule by mouth.    . fluticasone (FLONASE) 50 MCG/ACT nasal spray Place 2 sprays into both nostrils daily. (Patient taking differently: Place 2 sprays into both nostrils as needed. ) 16 g 3  . levocetirizine (XYZAL) 5 MG tablet Take 1 tablet (5 mg total) by mouth every evening. (Patient taking differently: Take 5 mg by mouth as needed. ) 30 tablet 3  . NON FORMULARY as needed. OTC SINUS Medication.     No current facility-administered medications on file prior to visit.     Past Surgical History:  Procedure Laterality Date  . DENTAL SURGERY    . TONSILLECTOMY     Late 1940s    No Known Allergies  Social History   Socioeconomic History  . Marital status: Married    Spouse name: Not on file  .  Number of children: 3  . Years of education: Not on file  . Highest education level: Not on file  Occupational History  . Not on file  Social Needs  . Financial resource strain: Not on file  . Food insecurity:    Worry: Not on file    Inability: Not on file  . Transportation needs:    Medical: Not on file    Non-medical: Not on file  Tobacco Use  . Smoking status: Former Smoker    Types: Pipe    Start date: 02/19/1977  . Smokeless tobacco: Never Used  . Tobacco comment: >25 years ago, pipe only.  Substance and Sexual Activity  . Alcohol use: Yes    Comment: Maybe 1/2 drink a week  . Drug use: No  . Sexual activity: Yes    Comment: lives with wife, retired from education, [place], no major dietary restrictions  Lifestyle  . Physical activity:    Days per week: Not on file    Minutes per session: Not on file  . Stress: Not on file  Relationships  . Social connections:    Talks on phone: Not on file    Gets together: Not on file    Attends religious service: Not on file    Active  member of club or organization: Not on file    Attends meetings of clubs or organizations: Not on file    Relationship status: Not on file  . Intimate partner violence:    Fear of current or ex partner: Not on file    Emotionally abused: Not on file    Physically abused: Not on file    Forced sexual activity: Not on file  Other Topics Concern  . 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 Heart disease Father        CABG at 8, died age 52  . Arthritis Father   . Arthritis Mother        Living 2  . Hypertension Mother   . Hyperlipidemia Mother   . Drug abuse Son     BP 115/75   Pulse 60   Ht 5\' 9"  (1.753 m)   Wt 165 lb (74.8 kg)   BMI 24.37 kg/m   Review of Systems: See HPI above.     Objective:  Physical Exam:  Gen: NAD,  comfortable in exam room  Left knee: No gross deformity, ecchymoses.  Mild effusion. TTP medial joint line and medial patellar facet.  No other tenderness. FROM with 5/5 strength. Negative ant/post drawers. Negative valgus/varus testing. Negative lachmanns. Negative mcmurrays, apleys, patellar apprehension. NV intact distally.  Right knee: No deformity. FROM with 5/5 strength. Mild TTP medial joint line. NVI distally.   Assessment & Plan:  1. Left knee pain -consistent with flare of arthritis and synovitis.  We discussed Tylenol, topical medications, ibuprofen, supplements that might help.  He is going to consider cortisone injection if this does not continue to improve over the next week or 2.  Icing 15 minutes at a time 3-4 times a day.  Shown home exercises as well.  Follow-up in 1 month or as needed if doing well.

## 2017-07-04 ENCOUNTER — Ambulatory Visit: Payer: Medicare HMO | Admitting: Family Medicine

## 2017-07-11 DIAGNOSIS — L578 Other skin changes due to chronic exposure to nonionizing radiation: Secondary | ICD-10-CM | POA: Diagnosis not present

## 2017-07-11 DIAGNOSIS — L570 Actinic keratosis: Secondary | ICD-10-CM | POA: Diagnosis not present

## 2017-07-11 DIAGNOSIS — D1801 Hemangioma of skin and subcutaneous tissue: Secondary | ICD-10-CM | POA: Diagnosis not present

## 2017-07-11 DIAGNOSIS — L821 Other seborrheic keratosis: Secondary | ICD-10-CM | POA: Diagnosis not present

## 2017-08-16 ENCOUNTER — Encounter: Payer: Self-pay | Admitting: Family Medicine

## 2017-10-29 ENCOUNTER — Ambulatory Visit: Payer: Medicare HMO | Admitting: Family Medicine

## 2017-10-29 VITALS — BP 110/68 | HR 65 | Temp 98.0°F | Resp 18 | Wt 164.4 lb

## 2017-10-29 DIAGNOSIS — J208 Acute bronchitis due to other specified organisms: Secondary | ICD-10-CM

## 2017-10-29 DIAGNOSIS — B9689 Other specified bacterial agents as the cause of diseases classified elsewhere: Secondary | ICD-10-CM | POA: Diagnosis not present

## 2017-10-29 MED ORDER — CEFDINIR 300 MG PO CAPS: 300.0000 mg | ORAL_CAPSULE | Freq: Two times a day (BID) | ORAL | 0 refills | Status: AC

## 2017-10-29 MED ORDER — BENZONATATE 100 MG PO CAPS: 100.0000 mg | ORAL_CAPSULE | Freq: Three times a day (TID) | ORAL | 0 refills | Status: DC | PRN

## 2017-10-29 NOTE — Patient Instructions (Addendum)
Encouraged increased rest and hydration, add probiotics, zinc such as Coldeze or Xicam. Treat fevers as needed. Mucinex twice day, vitamin  C 500 to 1000 mg and Elderberry liquids   Warm tea with lemon and honey, Willow Bark tea Upper Respiratory Infection, Adult Most upper respiratory infections (URIs) are caused by a virus. A URI affects the nose, throat, and upper air passages. The most common type of URI is often called "the common cold." Follow these instructions at home:  Take medicines only as told by your doctor.  Gargle warm saltwater or take cough drops to comfort your throat as told by your doctor.  Use a warm mist humidifier or inhale steam from a shower to increase air moisture. This may make it easier to breathe.  Drink enough fluid to keep your pee (urine) clear or pale yellow.  Eat soups and other clear broths.  Have a healthy diet.  Rest as needed.  Go back to work when your fever is gone or your doctor says it is okay. ? You may need to stay home longer to avoid giving your URI to others. ? You can also wear a face mask and wash your hands often to prevent spread of the virus.  Use your inhaler more if you have asthma.  Do not use any tobacco products, including cigarettes, chewing tobacco, or electronic cigarettes. If you need help quitting, ask your doctor. Contact a doctor if:  You are getting worse, not better.  Your symptoms are not helped by medicine.  You have chills.  You are getting more short of breath.  You have brown or red mucus.  You have yellow or brown discharge from your nose.  You have pain in your face, especially when you bend forward.  You have a fever.  You have puffy (swollen) neck glands.  You have pain while swallowing.  You have white areas in the back of your throat. Get help right away if:  You have very bad or constant: ? Headache. ? Ear pain. ? Pain in your forehead, behind your eyes, and over your cheekbones  (sinus pain). ? Chest pain.  You have long-lasting (chronic) lung disease and any of the following: ? Wheezing. ? Long-lasting cough. ? Coughing up blood. ? A change in your usual mucus.  You have a stiff neck.  You have changes in your: ? Vision. ? Hearing. ? Thinking. ? Mood. This information is not intended to replace advice given to you by your health care provider. Make sure you discuss any questions you have with your health care provider. Document Released: 07/25/2007 Document Revised: 10/09/2015 Document Reviewed: 05/13/2013 Elsevier Interactive Patient Education  2018 [employer].

## 2017-10-30 DIAGNOSIS — J208 Acute bronchitis due to other specified organisms: Principal | ICD-10-CM

## 2017-10-30 DIAGNOSIS — B9689 Other specified bacterial agents as the cause of diseases classified elsewhere: Secondary | ICD-10-CM | POA: Insufficient documentation

## 2017-10-30 LAB — CBC WITH DIFFERENTIAL/PLATELET
BASOS PCT: 1 % (ref 0.0–3.0)
Basophils Absolute: 0.1 10*3/uL (ref 0.0–0.1)
EOS PCT: 3.7 % (ref 0.0–5.0)
Eosinophils Absolute: 0.3 10*3/uL (ref 0.0–0.7)
HCT: 41 % (ref 39.0–52.0)
HEMOGLOBIN: 14 g/dL (ref 13.0–17.0)
LYMPHS ABS: 2.2 10*3/uL (ref 0.7–4.0)
Lymphocytes Relative: 26.2 % (ref 12.0–46.0)
MCHC: 34.1 g/dL (ref 30.0–36.0)
MCV: 89.8 fl (ref 78.0–100.0)
MONO ABS: 0.6 10*3/uL (ref 0.1–1.0)
MONOS PCT: 7.6 % (ref 3.0–12.0)
NEUTROS PCT: 61.5 % (ref 43.0–77.0)
Neutro Abs: 5.2 10*3/uL (ref 1.4–7.7)
Platelets: 306 10*3/uL (ref 150.0–400.0)
RBC: 4.57 Mil/uL (ref 4.22–5.81)
RDW: 13.3 % (ref 11.5–15.5)
WBC: 8.5 10*3/uL (ref 4.0–10.5)

## 2017-10-30 NOTE — Progress Notes (Signed)
Subjective:    Patient ID: Bradley Peterson, male    DOB: [birth year], 77 y.o.   MRN: [id number]  No chief complaint on file.   HPI Patient is in today for evaluation of a cough and congestion he has had about a week. He has had some yellow rhinorrhea, headache, sinus pressue and Post nasal drip.no feves or chills. He ha sused a mucinex cold and flu product with some temporary relief. Also notes some fatigue and myalgias. Has also had some ear pessure at times. Denies CP/palp/SOBfevers/GI or GU c/o. Taking meds as prescribed  Past Medical History:  Diagnosis Date  . Allergic state 12/27/2013  . Arthritis of neck   . Positive TB test     Past Surgical History:  Procedure Laterality Date  . DENTAL SURGERY    . TONSILLECTOMY     Late 71s    Family History  Problem Relation Age of Onset  . Heart disease Father        CABG at 30, died age 53  . Arthritis Father   . Arthritis Mother        Living 62  . Hypertension Mother   . Hyperlipidemia Mother   . Drug abuse Son     Social History   Socioeconomic History  . Marital status: Married    Spouse name: Not on file  . Number of children: 3  . Years of education: Not on file  . Highest education level: Not on file  Occupational History  . Not on file  Social Needs  . Financial resource strain: Not on file  . Food insecurity:    Worry: Not on file    Inability: Not on file  . Transportation needs:    Medical: Not on file    Non-medical: Not on file  Tobacco Use  . Smoking status: Former Smoker    Types: Pipe    Start date: 02/19/1977  . Smokeless tobacco: Never Used  . Tobacco comment: >25 years ago, pipe only.  Substance and Sexual Activity  . Alcohol use: Yes    Comment: Maybe 1/2 drink a week  . Drug use: No  . 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 Physical activity:    Days per week: Not on file   Minutes per session: Not on file  . Stress: Not on file  Relationships  . Social connections:    Talks on phone: Not on file    Gets together: Not on file    Attends religious service: Not on file    Active member of club or organization: Not on file    Attends meetings of clubs or organizations: Not on file    Relationship status: Not on file  . Intimate partner violence:    Fear of current or ex partner: Not on file    Emotionally abused: Not on file    Physically abused: Not on file    Forced sexual activity: Not on file  Other Topics Concern  . 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 Cholecalciferol (VITAMIN D3) 2000 UNITS capsule Take 2,000 Units by mouth daily.    . Ferrous Sulfate (IRON) 90 (18 Fe) MG TABS Take 1 capsule  by mouth.    . fluticasone (FLONASE) 50 MCG/ACT nasal spray Place 2 sprays into both nostrils daily. (Patient taking differently: Place 2 sprays into both nostrils as needed. ) 16 g 3  . levocetirizine (XYZAL) 5 MG tablet Take 1 tablet (5 mg total) by mouth every evening. (Patient taking differently: Take 5 mg by mouth as needed. ) 30 tablet 3  . diclofenac (VOLTAREN) 75 MG EC tablet Take 1 tablet (75 mg total) by mouth 2 (two) times daily. 15 tablet 0  . NON FORMULARY as needed. OTC SINUS Medication.     No facility-administered medications prior to visit.     No Known Allergies  Review of Systems  Constitutional: Positive for malaise/fatigue. Negative for fever.  HENT: Positive for congestion and sinus pain.   Eyes: Negative for blurred vision.  Respiratory: Positive for cough and sputum production. Negative for shortness of breath.   Cardiovascular: Negative for chest pain, palpitations and leg swelling.  Gastrointestinal: Negative for abdominal pain, blood in stool and nausea.    Genitourinary: Negative for dysuria and frequency.  Musculoskeletal: Negative for falls.  Skin: Negative for rash.  Neurological: Negative for dizziness, loss of consciousness and headaches.  Endo/Heme/Allergies: Negative for environmental allergies.  Psychiatric/Behavioral: Negative for depression. The patient is not nervous/anxious.        Objective:    Physical Exam  Constitutional: He is oriented to person, place, and time. He appears well-developed and well-nourished. No distress.  HENT:  Head: Normocephalic and atraumatic.  Nose: Nose normal.  Nasal mucosa is boggy and eythematous.   Eyes: Right eye exhibits no discharge. Left eye exhibits no discharge.  Neck: Normal range of motion. Neck supple.  Cardiovascular: Normal rate and regular rhythm.  No murmur heard. Pulmonary/Chest: Effort normal and breath sounds normal.  Abdominal: Soft. Bowel sounds are normal. There is no tenderness.  Musculoskeletal: He exhibits no edema.  Neurological: He is alert and oriented to person, place, and time.  Skin: Skin is warm and dry.  Psychiatric: He has a normal mood and affect.  Nursing note and vitals reviewed.   BP 110/68 (BP Location: Left Arm, Patient Position: Sitting, Cuff Size: Normal)   Pulse 65   Temp 98 F (36.7 C) (Oral)   Resp 18   Wt 164 lb 6.4 oz (74.6 kg)   SpO2 97%   BMI 24.28 kg/m  Wt Readings from Last 3 Encounters:  10/29/17 164 lb 6.4 oz (74.6 kg)  06/18/17 165 lb (74.8 kg)  05/03/17 168 lb (76.2 kg)     Lab Results  Component Value Date   WBC 8.5 10/29/2017   HGB 14.0 10/29/2017   HCT 41.0 10/29/2017   PLT 306.0 10/29/2017   GLUCOSE 94 01/03/2017   CHOL 170 02/03/2016   TRIG 140 02/03/2016   HDL 49 02/03/2016   LDLCALC 93 02/03/2016   ALT 17 01/03/2017   AST 21 01/03/2017   NA 141 01/03/2017   K 4.2 01/03/2017   CL 105 01/03/2017   CREATININE 0.87 01/03/2017   BUN 14 01/03/2017   CO2 29 01/03/2017   TSH 0.84 01/03/2017   PSA 0.65  12/24/2013    Lab Results  Component Value Date   TSH 0.84 01/03/2017   Lab Results  Component Value Date   WBC 8.5 10/29/2017   HGB 14.0 10/29/2017   HCT 41.0 10/29/2017   MCV 89.8 10/29/2017   PLT 306.0 10/29/2017   Lab Results  Component Value Date   NA 141 01/03/2017  K 4.2 01/03/2017   CO2 29 01/03/2017   GLUCOSE 94 01/03/2017   BUN 14 01/03/2017   CREATININE 0.87 01/03/2017   BILITOT 0.7 01/03/2017   ALKPHOS 68 01/03/2017   AST 21 01/03/2017   ALT 17 01/03/2017   PROT 7.3 01/03/2017   ALBUMIN 4.4 01/03/2017   CALCIUM 9.9 01/03/2017   ANIONGAP 6 01/31/2016   GFR 90.86 01/03/2017   Lab Results  Component Value Date   CHOL 170 02/03/2016   Lab Results  Component Value Date   HDL 49 02/03/2016   Lab Results  Component Value Date   LDLCALC 93 02/03/2016   Lab Results  Component Value Date   TRIG 140 02/03/2016   Lab Results  Component Value Date   CHOLHDL 3.5 02/03/2016   No results found for: HGBA1C     Assessment & Plan:   Problem List Items Addressed This Visit    Acute bacterial bronchitis - Primary    Encouraged increased rest and hydration, add probiotics, zinc such as Coldeze or Xicam. Treat fevers as needed. Elderberry, mucinex and vitamin C. If no improvement or worsens then he is given an antibiotic to stat.       Relevant Medications   cefdinir (OMNICEF) 300 MG capsule   Other Relevant Orders   CBC with Differential/Platelet (Completed)      I have discontinued [name] "Jim"'s NON FORMULARY and diclofenac. I am also having him start on cefdinir and benzonatate. Additionally, I am having him maintain his Vitamin D3, Iron, levocetirizine, and fluticasone.  Meds ordered this encounter  Medications  . cefdinir (OMNICEF) 300 MG capsule    Sig: Take 1 capsule (300 mg total) by mouth 2 (two) times daily for 10 days.    Dispense:  20 capsule    Refill:  0  . benzonatate (TESSALON) 100 MG capsule    Sig: Take 1 capsule (100  mg total) by mouth 3 (three) times daily as needed for cough.    Dispense:  30 capsule    Refill:  0     Penni Homans, MD

## 2017-10-30 NOTE — Assessment & Plan Note (Signed)
Encouraged increased rest and hydration, add probiotics, zinc such as Coldeze or Xicam. Treat fevers as needed. Elderberry, mucinex and vitamin C. If no improvement or worsens then he is given an antibiotic to stat.

## 2017-10-31 DIAGNOSIS — D3132 Benign neoplasm of left choroid: Secondary | ICD-10-CM | POA: Diagnosis not present

## 2017-10-31 DIAGNOSIS — H259 Unspecified age-related cataract: Secondary | ICD-10-CM | POA: Diagnosis not present

## 2017-10-31 DIAGNOSIS — H04123 Dry eye syndrome of bilateral lacrimal glands: Secondary | ICD-10-CM | POA: Diagnosis not present

## 2018-01-06 ENCOUNTER — Ambulatory Visit (INDEPENDENT_AMBULATORY_CARE_PROVIDER_SITE_OTHER): Payer: Medicare HMO | Admitting: Family Medicine

## 2018-01-06 ENCOUNTER — Encounter: Payer: Medicare HMO | Admitting: Family Medicine

## 2018-01-06 VITALS — BP 122/70 | HR 52 | Temp 98.2°F | Resp 18 | Ht 69.0 in | Wt 165.2 lb

## 2018-01-06 DIAGNOSIS — R351 Nocturia: Secondary | ICD-10-CM

## 2018-01-06 DIAGNOSIS — I4510 Unspecified right bundle-branch block: Secondary | ICD-10-CM | POA: Diagnosis not present

## 2018-01-06 DIAGNOSIS — Z23 Encounter for immunization: Secondary | ICD-10-CM | POA: Diagnosis not present

## 2018-01-06 DIAGNOSIS — D649 Anemia, unspecified: Secondary | ICD-10-CM | POA: Diagnosis not present

## 2018-01-06 DIAGNOSIS — Z0000 Encounter for general adult medical examination without abnormal findings: Secondary | ICD-10-CM

## 2018-01-06 LAB — CBC
HCT: 38.3 % — ABNORMAL LOW (ref 39.0–52.0)
Hemoglobin: 13.1 g/dL (ref 13.0–17.0)
MCHC: 34.2 g/dL (ref 30.0–36.0)
MCV: 90.3 fl (ref 78.0–100.0)
Platelets: 233 10*3/uL (ref 150.0–400.0)
RBC: 4.24 Mil/uL (ref 4.22–5.81)
RDW: 14.3 % (ref 11.5–15.5)
WBC: 4.9 10*3/uL (ref 4.0–10.5)

## 2018-01-06 LAB — COMPREHENSIVE METABOLIC PANEL
ALBUMIN: 4.2 g/dL (ref 3.5–5.2)
ALT: 19 U/L (ref 0–53)
AST: 23 U/L (ref 0–37)
Alkaline Phosphatase: 61 U/L (ref 39–117)
BUN: 20 mg/dL (ref 6–23)
CHLORIDE: 106 meq/L (ref 96–112)
CO2: 26 mEq/L (ref 19–32)
CREATININE: 0.75 mg/dL (ref 0.40–1.50)
Calcium: 9.5 mg/dL (ref 8.4–10.5)
GFR: 107.55 mL/min (ref >60.00)
GLUCOSE: 88 mg/dL (ref 70–99)
Potassium: 4.4 mEq/L (ref 3.5–5.1)
SODIUM: 140 meq/L (ref 135–145)
TOTAL PROTEIN: 6.7 g/dL (ref 6.0–8.3)
Total Bilirubin: 0.6 mg/dL (ref 0.2–1.2)

## 2018-01-06 LAB — TSH: TSH: 0.89 u[IU]/mL (ref 0.35–4.50)

## 2018-01-06 LAB — PSA: PSA: 0.79 ng/mL (ref 0.10–4.00)

## 2018-01-06 NOTE — Assessment & Plan Note (Signed)
Patient encouraged to maintain heart healthy diet, regular exercise, adequate sleep. Consider daily probiotics. Take medications as prescribed 

## 2018-01-06 NOTE — Progress Notes (Signed)
+   Subjective:    Patient ID: Bradley Peterson, male    DOB: Feb 07, 1941, 77 y.o.   MRN: [id number]  No chief complaint on file.   HPI Patient is in today for annual preventative exam and follow-up on chronic medical concerns including a history of anemia and arthritis.  He denies any acute complaints.  He had no recent febrile illness or hospitalizations.  He continues to stay active and maintain a heart healthy diet.  He manages his activities of daily living well.  He has had no open concerns regarding his heart. Denies CP/palp/SOB/HA/congestion/fevers/GI or GU c/o. Taking meds as prescribed  Past Medical History:  Diagnosis Date  . Allergic state 12/27/2013  . Arthritis of neck   . Positive TB test     Past Surgical History:  Procedure Laterality Date  . DENTAL SURGERY    . TONSILLECTOMY     Late 65s    Family History  Problem Relation Age of Onset  . Heart disease Father        CABG at 24, died age 63  . Arthritis Father   . Arthritis Mother        Living 23  . Hypertension Mother   . Hyperlipidemia Mother   . Drug abuse Son     Social History   Socioeconomic History  . Marital status: Married    Spouse name: Not on file  . Number of children: 3  . Years of education: Not on file  . Highest education level: Not on file  Occupational History  . Not on file  Social Needs  . Financial resource strain: Not on file  . Food insecurity:    Worry: Not on file    Inability: Not on file  . Transportation needs:    Medical: Not on file    Non-medical: Not on file  Tobacco Use  . Smoking status: Former Smoker    Types: Pipe    Start date: 02/19/1977  . Smokeless tobacco: Never Used  . Tobacco comment: >25 years ago, pipe only.  Substance and Sexual Activity  . Alcohol use: Yes    Comment: Maybe 1/2 drink a week  . Drug use: No  . 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 Physical activity:    Days per week: Not on file    Minutes per session: Not on file  . Stress: Not on file  Relationships  . Social connections:    Talks on phone: Not on file    Gets together: Not on file    Attends religious service: Not on file    Active member of club or organization: Not on file    Attends meetings of clubs or organizations: Not on file    Relationship status: Not on file  . Intimate partner violence:    Fear of current or ex partner: Not on file    Emotionally abused: Not on file    Physically abused: Not on file    Forced sexual activity: Not on file  Other Topics Concern  . 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 benzonatate (TESSALON) 100 MG capsule Take 1 capsule (100 mg total) by mouth 3 (three)  times daily as needed for cough. 30 capsule 0  . Cholecalciferol (VITAMIN D3) 2000 UNITS capsule Take 2,000 Units by mouth daily.    . Ferrous Sulfate (IRON) 90 (18 Fe) MG TABS Take 1 capsule by mouth.    . fluticasone (FLONASE) 50 MCG/ACT nasal spray Place 2 sprays into both nostrils daily. (Patient taking differently: Place 2 sprays into both nostrils as needed. ) 16 g 3  . levocetirizine (XYZAL) 5 MG tablet Take 1 tablet (5 mg total) by mouth every evening. (Patient taking differently: Take 5 mg by mouth as needed. ) 30 tablet 3   No facility-administered medications prior to visit.     No Known Allergies  Review of Systems  Constitutional: Negative for chills, fever and malaise/fatigue.  HENT: Negative for congestion and hearing loss.   Eyes: Negative for discharge.  Respiratory: Negative for cough, sputum production and shortness of breath.   Cardiovascular: Negative for chest pain, palpitations and leg swelling.  Gastrointestinal: Negative for abdominal pain, blood in stool,  constipation, diarrhea, heartburn, nausea and vomiting.  Genitourinary: Negative for dysuria, frequency, hematuria and urgency.  Musculoskeletal: Negative for back pain, falls and myalgias.  Skin: Negative for rash.  Neurological: Negative for dizziness, sensory change, loss of consciousness, weakness and headaches.  Endo/Heme/Allergies: Negative for environmental allergies. Does not bruise/bleed easily.  Psychiatric/Behavioral: Negative for depression and suicidal ideas. The patient is not nervous/anxious and does not have insomnia.        Objective:    Physical Exam  Constitutional: He is oriented to person, place, and time. He appears well-developed and well-nourished. No distress.  HENT:  Head: Normocephalic and atraumatic.  Right Ear: External ear normal.  Left Ear: External ear normal.  Nose: Nose normal.  Eyes: Pupils are equal, round, and reactive to light. Conjunctivae and EOM are normal. Right eye exhibits no discharge. Left eye exhibits no discharge.  Neck: Normal range of motion. Neck supple. No thyromegaly present.  Cardiovascular: Normal rate and regular rhythm.  No murmur heard. Pulmonary/Chest: Effort normal and breath sounds normal. He exhibits no tenderness.  Abdominal: Soft. Bowel sounds are normal. He exhibits no distension. There is no tenderness. There is no guarding.  Musculoskeletal: He exhibits no edema.  Lymphadenopathy:    He has no cervical adenopathy.  Neurological: He is alert and oriented to person, place, and time. He displays normal reflexes. No cranial nerve deficit.  Skin: Skin is warm and dry.  Psychiatric: He has a normal mood and affect.  Nursing note and vitals reviewed.   BP 122/70 (BP Location: Left Arm, Patient Position: Sitting, Cuff Size: Normal)   Pulse (!) 52   Temp 98.2 F (36.8 C) (Oral)   Resp 18   Ht 5\' 9"  (1.753 m)   Wt 165 lb 3.2 oz (74.9 kg)   SpO2 96%   BMI 24.40 kg/m  Wt Readings from Last 3 Encounters:  01/06/18 165  lb 3.2 oz (74.9 kg)  10/29/17 164 lb 6.4 oz (74.6 kg)  06/18/17 165 lb (74.8 kg)     Lab Results  Component Value Date   WBC 8.5 10/29/2017   HGB 14.0 10/29/2017   HCT 41.0 10/29/2017   PLT 306.0 10/29/2017   GLUCOSE 94 01/03/2017   CHOL 170 02/03/2016   TRIG 140 02/03/2016   HDL 49 02/03/2016   LDLCALC 93 02/03/2016   ALT 17 01/03/2017   AST 21 01/03/2017   NA 141 01/03/2017   K 4.2 01/03/2017   CL 105 01/03/2017  CREATININE 0.87 01/03/2017   BUN 14 01/03/2017   CO2 29 01/03/2017   TSH 0.84 01/03/2017   PSA 0.65 12/24/2013    Lab Results  Component Value Date   TSH 0.84 01/03/2017   Lab Results  Component Value Date   WBC 8.5 10/29/2017   HGB 14.0 10/29/2017   HCT 41.0 10/29/2017   MCV 89.8 10/29/2017   PLT 306.0 10/29/2017   Lab Results  Component Value Date   NA 141 01/03/2017   K 4.2 01/03/2017   CO2 29 01/03/2017   GLUCOSE 94 01/03/2017   BUN 14 01/03/2017   CREATININE 0.87 01/03/2017   BILITOT 0.7 01/03/2017   ALKPHOS 68 01/03/2017   AST 21 01/03/2017   ALT 17 01/03/2017   PROT 7.3 01/03/2017   ALBUMIN 4.4 01/03/2017   CALCIUM 9.9 01/03/2017   ANIONGAP 6 01/31/2016   GFR 90.86 01/03/2017   Lab Results  Component Value Date   CHOL 170 02/03/2016   Lab Results  Component Value Date   HDL 49 02/03/2016   Lab Results  Component Value Date   LDLCALC 93 02/03/2016   Lab Results  Component Value Date   TRIG 140 02/03/2016   Lab Results  Component Value Date   CHOLHDL 3.5 02/03/2016   No results found for: HGBA1C     Assessment & Plan:   Problem List Items Addressed This Visit    Anemia    Monitor with CBC      Relevant Orders   CBC   Right bundle branch block    asymptomatic      Relevant Orders   Comprehensive metabolic panel   TSH   Preventative health care    Patient encouraged to maintain heart healthy diet, regular exercise, adequate sleep. Consider daily probiotics. Take medications as prescribed      Relevant  Orders   CBC   Comprehensive metabolic panel   TSH    Other Visit Diagnoses    Nocturia    -  Primary   Relevant Orders   PSA      I have discontinued [name] "Jim"'s levocetirizine. I am also having him maintain his Vitamin D3, Iron, fluticasone, and benzonatate.  No orders of the defined types were placed in this encounter.    Penni Homans, MD

## 2018-01-06 NOTE — Assessment & Plan Note (Signed)
Monitor with CBC 

## 2018-01-06 NOTE — Assessment & Plan Note (Signed)
asymptomatic

## 2018-01-06 NOTE — Patient Instructions (Signed)
Bring [place] copy of advanced directives if able and interested Preventive Care 17 Years and Older, Male Preventive care refers to lifestyle choices and visits with your health care provider that can promote health and wellness. What does preventive care include?  A yearly physical exam. This is also called an annual well check.  Dental exams once or twice a year.  Routine eye exams. Ask your health care provider how often you should have your eyes checked.  Personal lifestyle choices, including: ? Daily care of your teeth and gums. ? Regular physical activity. ? Eating a healthy diet. ? Avoiding tobacco and drug use. ? Limiting alcohol use. ? Practicing safe sex. ? Taking low doses of aspirin every day. ? Taking vitamin and mineral supplements as recommended by your health care provider. What happens during an annual well check? The services and screenings done by your health care provider during your annual well check will depend on your age, overall health, lifestyle risk factors, and family history of disease. Counseling Your health care provider may ask you questions about your:  Alcohol use.  Tobacco use.  Drug use.  Emotional well-being.  Home and relationship well-being.  Sexual activity.  Eating habits.  History of falls.  Memory and ability to understand (cognition).  Work and work Statistician.  Screening You may have the following tests or measurements:  Height, weight, and BMI.  Blood pressure.  Lipid and cholesterol levels. These may be checked every 5 years, or more frequently if you are over 54 years old.  Skin check.  Lung cancer screening. You may have this screening every year starting at age 3 if you have a 30-pack-year history of smoking and currently smoke or have quit within the past 15 years.  Fecal occult blood test (FOBT) of the stool. You may have this test every year starting at age 36.  Flexible sigmoidoscopy or colonoscopy. You may  have a sigmoidoscopy every 5 years or a colonoscopy every 10 years starting at age 81.  Prostate cancer screening. Recommendations will vary depending on your family history and other risks.  Hepatitis C blood test.  Hepatitis B blood test.  Sexually transmitted disease (STD) testing.  Diabetes screening. This is done by checking your blood sugar (glucose) after you have not eaten for a while (fasting). You may have this done every 1-3 years.  Abdominal aortic aneurysm (AAA) screening. You may need this if you are a current or former smoker.  Osteoporosis. You may be screened starting at age 80 if you are at high risk.  Talk with your health care provider about your test results, treatment options, and if necessary, the need for more tests. Vaccines Your health care provider may recommend certain vaccines, such as:  Influenza vaccine. This is recommended every year.  Tetanus, diphtheria, and acellular pertussis (Tdap, Td) vaccine. You may need a Td booster every 10 years.  Varicella vaccine. You may need this if you have not been vaccinated.  Zoster vaccine. You may need this after age 50.  Measles, mumps, and rubella (MMR) vaccine. You may need at least one dose of MMR if you were born in 1957 or later. You may also need a second dose.  Pneumococcal 13-valent conjugate (PCV13) vaccine. One dose is recommended after age 22.  Pneumococcal polysaccharide (PPSV23) vaccine. One dose is recommended after age 80.  Meningococcal vaccine. You may need this if you have certain conditions.  Hepatitis A vaccine. You may need this if you have certain conditions  or if you travel or work in places where you may be exposed to hepatitis A.  Hepatitis B vaccine. You may need this if you have certain conditions or if you travel or work in places where you may be exposed to hepatitis B.  Haemophilus influenzae type b (Hib) vaccine. You may need this if you have certain risk factors.  Talk to  your health care provider about which screenings and vaccines you need and how often you need them. This information is not intended to replace advice given to you by your health care provider. Make sure you discuss any questions you have with your health care provider. Document Released: 03/04/2015 Document Revised: 10/26/2015 Document Reviewed: 12/07/2014 Elsevier Interactive Patient Education  [employer].

## 2018-01-10 ENCOUNTER — Telehealth: Payer: Self-pay | Admitting: *Deleted

## 2018-01-10 NOTE — Telephone Encounter (Signed)
Received Medical records from [facility] - Dr Ozella Almond; forwarded to provider/SLS 11/22

## 2018-01-14 DIAGNOSIS — I4510 Unspecified right bundle-branch block: Secondary | ICD-10-CM

## 2018-03-24 ENCOUNTER — Other Ambulatory Visit (INDEPENDENT_AMBULATORY_CARE_PROVIDER_SITE_OTHER): Payer: Medicare HMO

## 2018-03-24 DIAGNOSIS — I4510 Unspecified right bundle-branch block: Secondary | ICD-10-CM

## 2018-03-24 LAB — CBC WITH DIFFERENTIAL/PLATELET
BASOS PCT: 1 % (ref 0.0–3.0)
Basophils Absolute: 0.1 10*3/uL (ref 0.0–0.1)
EOS PCT: 3 % (ref 0.0–5.0)
Eosinophils Absolute: 0.2 10*3/uL (ref 0.0–0.7)
HCT: 43.6 % (ref 39.0–52.0)
Hemoglobin: 14.7 g/dL (ref 13.0–17.0)
LYMPHS ABS: 1.5 10*3/uL (ref 0.7–4.0)
Lymphocytes Relative: 26.7 % (ref 12.0–46.0)
MCHC: 33.8 g/dL (ref 30.0–36.0)
MCV: 91.7 fl (ref 78.0–100.0)
MONO ABS: 0.5 10*3/uL (ref 0.1–1.0)
Monocytes Relative: 9.5 % (ref 3.0–12.0)
NEUTROS ABS: 3.4 10*3/uL (ref 1.4–7.7)
NEUTROS PCT: 59.8 % (ref 43.0–77.0)
Platelets: 240 10*3/uL (ref 150.0–400.0)
RBC: 4.75 Mil/uL (ref 4.22–5.81)
RDW: 13.5 % (ref 11.5–15.5)
WBC: 5.7 10*3/uL (ref 4.0–10.5)

## 2018-03-24 LAB — RETICULOCYTES
ABS RETIC: 38320 {cells}/uL (ref 25000–9000)
Retic Ct Pct: 0.8 %

## 2018-07-15 DIAGNOSIS — L578 Other skin changes due to chronic exposure to nonionizing radiation: Secondary | ICD-10-CM | POA: Diagnosis not present

## 2018-07-15 DIAGNOSIS — L920 Granuloma annulare: Secondary | ICD-10-CM | POA: Diagnosis not present

## 2018-07-15 DIAGNOSIS — L821 Other seborrheic keratosis: Secondary | ICD-10-CM | POA: Diagnosis not present

## 2018-07-15 DIAGNOSIS — D1801 Hemangioma of skin and subcutaneous tissue: Secondary | ICD-10-CM | POA: Diagnosis not present

## 2018-07-15 DIAGNOSIS — L570 Actinic keratosis: Secondary | ICD-10-CM | POA: Diagnosis not present

## 2018-10-31 DIAGNOSIS — R69 Illness, unspecified: Secondary | ICD-10-CM | POA: Diagnosis not present

## 2018-11-03 DIAGNOSIS — H259 Unspecified age-related cataract: Secondary | ICD-10-CM | POA: Diagnosis not present

## 2018-11-03 DIAGNOSIS — H04123 Dry eye syndrome of bilateral lacrimal glands: Secondary | ICD-10-CM | POA: Diagnosis not present

## 2018-11-03 DIAGNOSIS — D3132 Benign neoplasm of left choroid: Secondary | ICD-10-CM | POA: Diagnosis not present

## 2019-01-06 NOTE — Progress Notes (Addendum)
[facility] [address], [place], [place] [address] [phone] [id number]  Date:  01/07/2019   Name:  Bradley Peterson   DOB:  May 17, 1940   MRN:  [id number]  PCP:  Mosie Lukes, MD    Chief Complaint: Annual Exam   History of Present Illness:  Bradley Peterson is a 78 y.o. very pleasant male patient who presents with the following:  Here today for an annual physical Patient of Dr. 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  He really has no other concerns or complaints  He is taking vitamin C and zinc when he can find it  He plans to get a COVID-19 vaccine when available  Patient Active Problem List   Diagnosis Date Noted  . Left knee pain 06/19/2017  . Right knee pain 05/04/2017  . Preventative health care 01/06/2017  . Right bundle branch block 03/13/2016  . Medicare annual wellness visit, subsequent 12/27/2013  . Allergic state 12/27/2013  . Abnormal echocardiogram 01/07/2013  . 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 Internal hemorrhoid 02/25/2012  . Atypical chest pain 02/25/2012  . Anemia 02/25/2012    Past Medical History:  Diagnosis Date  .  Allergic state 12/27/2013  . Arthritis of neck   . Positive TB test     Past Surgical History:  Procedure Laterality Date  . DENTAL SURGERY    . TONSILLECTOMY     Late 41s    Social History   Tobacco Use  . Smoking status: Former Smoker    Types: Pipe    Start date: 02/19/1977  . Smokeless tobacco: Never Used  . Tobacco comment: >25 years ago, pipe only.  Substance Use Topics  . Alcohol use: Yes    Comment: Maybe 1/2 drink a week  . Drug use: No    Family History  Problem Relation Age of Onset  . Heart disease Father        CABG at 21, died age 43  . Arthritis Father   . Arthritis Mother        Living 87  . Hypertension Mother   . Hyperlipidemia Mother   . Drug abuse Son     No Known Allergies  Medication list has been reviewed and updated.  Current Outpatient Medications on File Prior to Visit  Medication Sig Dispense Refill  . Cholecalciferol (VITAMIN D3) 2000 UNITS capsule Take 2,000 Units by mouth daily.    . Ferrous Sulfate (IRON) 90 (18 Fe) MG TABS Take 1 capsule by mouth.    . fluticasone (FLONASE) 50 MCG/ACT nasal  spray Place 2 sprays into both nostrils daily. (Patient taking differently: Place 2 sprays into both nostrils as needed. ) 16 g 3   No current facility-administered medications on file prior to visit.     Review of Systems:  As per HPI- otherwise negative. No fever or chills  Physical Examination: Vitals:   01/07/19 1417  BP: 122/80  Pulse: 66  Resp: 16  Temp: (!) 97.3 F (36.3 C)  SpO2: 97%   Vitals:   01/07/19 1417  Weight: 160 lb (72.6 kg)  Height: 5\' 9"  (1.753 m)   Body mass index is 23.63 kg/m. Ideal Body Weight: Weight in (lb) to have BMI = 25: 168.9  GEN: WDWN, NAD, Non-toxic, A & O x 3, normal weight, looks well HEENT: Atraumatic, Normocephalic. Neck supple. No masses, No LAD. Bilateral TM wnl, oropharynx normal.  PEERL,EOMI.   Ears and Nose: No external deformity. CV: RRR, No M/G/R. No JVD. No thrill. No extra  heart sounds. PULM: CTA B, no wheezes, crackles, rhonchi. No retractions. No resp. distress. No accessory muscle use. ABD: S, NT, ND, +BS. No rebound. No HSM. EXTR: No c/c/e NEURO Normal gait.  PSYCH: Normally interactive. Conversant. Not depressed or anxious appearing.  Calm demeanor.    Assessment and Plan: Preventative health care  Anemia, unspecified type - Plan: CBC  Screening for prostate cancer - Plan: PSA  Screening for hyperlipidemia - Plan: Lipid panel  Screening for diabetes mellitus - Plan: Comprehensive metabolic panel, Hemoglobin A1c  Gentleman here today for a preventative exam.  We will not code his physical due to patient's insurance He is generally doing well and feeling great.  We will check routine labs as above, encouraged him to continue his good diet and exercise habits Immunizations and other screening are up-to-date Will plan further follow- up pending labs.   Signed Lamar Blinks, MD  Received his labs 11/19, message to patient  Results for orders placed or performed in visit on 01/07/19  CBC  Result Value Ref Range   WBC 7.4 4.0 - 10.5 K/uL   RBC 4.32 4.22 - 5.81 Mil/uL   Platelets 251.0 150.0 - 400.0 K/uL   Hemoglobin 13.4 13.0 - 17.0 g/dL   HCT 39.9 39.0 - 52.0 %   MCV 92.4 78.0 - 100.0 fl   MCHC 33.5 30.0 - 36.0 g/dL   RDW 13.1 11.5 - 15.5 %  Comprehensive metabolic panel  Result Value Ref Range   Sodium 140 135 - 145 mEq/L   Potassium 3.9 3.5 - 5.1 mEq/L   Chloride 103 96 - 112 mEq/L   CO2 27 19 - 32 mEq/L   Glucose, Bld 86 70 - 99 mg/dL   BUN 13 6 - 23 mg/dL   Creatinine, Ser 0.77 0.40 - 1.50 mg/dL   Total Bilirubin 0.6 0.2 - 1.2 mg/dL   Alkaline Phosphatase 65 39 - 117 U/L   AST 21 0 - 37 U/L   ALT 18 0 - 53 U/L   Total Protein 6.9 6.0 - 8.3 g/dL   Albumin 4.5 3.5 - 5.2 g/dL   GFR 97.90 >60.00 mL/min   Calcium 9.6 8.4 - 10.5 mg/dL  Hemoglobin A1c  Result Value Ref Range   Hgb A1c MFr Bld 6.0 4.6 - 6.5 %  Lipid panel   Result Value Ref Range   Cholesterol 164 0 - 200 mg/dL   Triglycerides 87.0 0.0 - 149.0 mg/dL   HDL 55.40 >39.00 mg/dL   VLDL 17.4 0.0 - 40.0 mg/dL  LDL Cholesterol 91 0 - 99 mg/dL   Total CHOL/HDL Ratio 3    NonHDL 108.82   PSA  Result Value Ref Range   PSA 0.93 0.10 - 4.00 ng/mL    Blood counts are normal Metabolic profile looks great A1c-average blood sugar the previous 3 months-is in the PRE-diabetes range.  This means you may be at greater risk for developing diabetes later on.  Continue to monitor Cholesterol looks good  Lab Results  Component Value Date   PSA 0.93 01/07/2019   PSA 0.79 01/06/2018   PSA 0.65 12/24/2013   Your PSA looks okay, but it has trended up a bit since last year I would recommend following up in 6 months for PSA and A1c level

## 2019-01-07 ENCOUNTER — Other Ambulatory Visit: Payer: Self-pay

## 2019-01-07 ENCOUNTER — Ambulatory Visit (INDEPENDENT_AMBULATORY_CARE_PROVIDER_SITE_OTHER): Payer: Medicare HMO | Admitting: Family Medicine

## 2019-01-07 ENCOUNTER — Encounter: Payer: Self-pay | Admitting: Family Medicine

## 2019-01-07 VITALS — BP 122/80 | HR 66 | Temp 97.3°F | Resp 16 | Ht 69.0 in | Wt 160.0 lb

## 2019-01-07 DIAGNOSIS — Z13220 Encounter for screening for lipoid disorders: Secondary | ICD-10-CM | POA: Diagnosis not present

## 2019-01-07 DIAGNOSIS — Z131 Encounter for screening for diabetes mellitus: Secondary | ICD-10-CM | POA: Diagnosis not present

## 2019-01-07 DIAGNOSIS — Z0000 Encounter for general adult medical examination without abnormal findings: Secondary | ICD-10-CM

## 2019-01-07 DIAGNOSIS — D649 Anemia, unspecified: Secondary | ICD-10-CM

## 2019-01-07 DIAGNOSIS — Z125 Encounter for screening for malignant neoplasm of prostate: Secondary | ICD-10-CM

## 2019-01-07 NOTE — Patient Instructions (Addendum)
It was a pleasure to see you today- I will be in touch with your labs asap Stay safe!   Continue your excellent exercise habits. You might try adding some weight training and balance exercises to your regimen   Health Maintenance After Age 77 After age 29, you are at a higher risk for certain long-term diseases and infections as well as injuries from falls. Falls are a major cause of broken bones and head injuries in people who are older than age 65. Getting regular preventive care can help to keep you healthy and well. Preventive care includes getting regular testing and making lifestyle changes as recommended by your health care provider. Talk with your health care provider about:  Which screenings and tests you should have. A screening is a test that checks for a disease when you have no symptoms.  A diet and exercise plan that is right for you. What should I know about screenings and tests to prevent falls? Screening and testing are the best ways to find a health problem early. Early diagnosis and treatment give you the best chance of managing medical conditions that are common after age 52. Certain conditions and lifestyle choices may make you more likely to have a fall. Your health care provider may recommend:  Regular vision checks. Poor vision and conditions such as cataracts can make you more likely to have a fall. If you wear glasses, make sure to get your prescription updated if your vision changes.  Medicine review. Work with your health care provider to regularly review all of the medicines you are taking, including over-the-counter medicines. Ask your health care provider about any side effects that may make you more likely to have a fall. Tell your health care provider if any medicines that you take make you feel dizzy or sleepy.  Osteoporosis screening. Osteoporosis is a condition that causes the bones to get weaker. This can make the bones weak and cause them to break more  easily.  Blood pressure screening. Blood pressure changes and medicines to control blood pressure can make you feel dizzy.  Strength and balance checks. Your health care provider may recommend certain tests to check your strength and balance while standing, walking, or changing positions.  Foot health exam. Foot pain and numbness, as well as not wearing proper footwear, can make you more likely to have a fall.  Depression screening. You may be more likely to have a fall if you have a fear of falling, feel emotionally low, or feel unable to do activities that you used to do.  Alcohol use screening. Using too much alcohol can affect your balance and may make you more likely to have a fall. What actions can I take to lower my risk of falls? General instructions  Talk with your health care provider about your risks for falling. Tell your health care provider if: ? You fall. Be sure to tell your health care provider about all falls, even ones that seem minor. ? You feel dizzy, sleepy, or off-balance.  Take over-the-counter and prescription medicines only as told by your health care provider. These include any supplements.  Eat a healthy diet and maintain a healthy weight. A healthy diet includes low-fat dairy products, low-fat (lean) meats, and fiber from whole grains, beans, and lots of fruits and vegetables. Home safety  Remove any tripping hazards, such as rugs, cords, and clutter.  Install safety equipment such as grab bars in bathrooms and safety rails on stairs.  Keep rooms  and walkways well-lit. Activity   Follow a regular exercise program to stay fit. This will help you maintain your balance. Ask your health care provider what types of exercise are appropriate for you.  If you need a cane or walker, use it as recommended by your health care provider.  Wear supportive shoes that have nonskid soles. Lifestyle  Do not drink alcohol if your health care provider tells you not to  drink.  If you drink alcohol, limit how much you have: ? 0-1 drink a day for women. ? 0-2 drinks a day for men.  Be aware of how much alcohol is in your drink. In the [place] one drink equals one typical bottle of beer (12 oz), one-half glass of wine (5 oz), or one shot of hard liquor (1 oz).  Do not use any products that contain nicotine or tobacco, such as cigarettes and e-cigarettes. If you need help quitting, ask your health care provider. Summary  Having a healthy lifestyle and getting preventive care can help to protect your health and wellness after age 41.  Screening and testing are the best way to find a health problem early and help you avoid having a fall. Early diagnosis and treatment give you the best chance for managing medical conditions that are more common for people who are older than age 85.  Falls are a major cause of broken bones and head injuries in people who are older than age 51. Take precautions to prevent a fall at home.  Work with your health care provider to learn what changes you can make to improve your health and wellness and to prevent falls. This information is not intended to replace advice given to you by your health care provider. Make sure you discuss any questions you have with your health care provider. Document Released: 12/19/2016 Document Revised: 05/29/2018 Document Reviewed: 12/19/2016 Elsevier Patient Education  2020 [employer].

## 2019-01-08 LAB — COMPREHENSIVE METABOLIC PANEL
ALT: 18 U/L (ref 0–53)
AST: 21 U/L (ref 0–37)
Albumin: 4.5 g/dL (ref 3.5–5.2)
Alkaline Phosphatase: 65 U/L (ref 39–117)
BUN: 13 mg/dL (ref 6–23)
CO2: 27 mEq/L (ref 19–32)
Calcium: 9.6 mg/dL (ref 8.4–10.5)
Chloride: 103 mEq/L (ref 96–112)
Creatinine, Ser: 0.77 mg/dL (ref 0.40–1.50)
GFR: 97.9 mL/min (ref >60.00)
Glucose, Bld: 86 mg/dL (ref 70–99)
Potassium: 3.9 mEq/L (ref 3.5–5.1)
Sodium: 140 mEq/L (ref 135–145)
Total Bilirubin: 0.6 mg/dL (ref 0.2–1.2)
Total Protein: 6.9 g/dL (ref 6.0–8.3)

## 2019-01-08 LAB — CBC
HCT: 39.9 % (ref 39.0–52.0)
Hemoglobin: 13.4 g/dL (ref 13.0–17.0)
MCHC: 33.5 g/dL (ref 30.0–36.0)
MCV: 92.4 fl (ref 78.0–100.0)
Platelets: 251 10*3/uL (ref 150.0–400.0)
RBC: 4.32 Mil/uL (ref 4.22–5.81)
RDW: 13.1 % (ref 11.5–15.5)
WBC: 7.4 10*3/uL (ref 4.0–10.5)

## 2019-01-08 LAB — LIPID PANEL
Cholesterol: 164 mg/dL (ref 0–200)
HDL: 55.4 mg/dL (ref >39.00)
LDL Cholesterol: 91 mg/dL (ref 0–99)
NonHDL: 108.82
Total CHOL/HDL Ratio: 3
Triglycerides: 87 mg/dL (ref 0.0–149.0)
VLDL: 17.4 mg/dL (ref 0.0–40.0)

## 2019-01-08 LAB — PSA: PSA: 0.93 ng/mL (ref 0.10–4.00)

## 2019-01-08 LAB — HEMOGLOBIN A1C: Hgb A1c MFr Bld: 6 % (ref 4.6–6.5)

## 2019-01-09 ENCOUNTER — Encounter: Payer: Medicare HMO | Admitting: Family Medicine

## 2019-04-04 ENCOUNTER — Ambulatory Visit: Payer: Medicare HMO | Attending: Internal Medicine

## 2019-04-04 DIAGNOSIS — Z23 Encounter for immunization: Secondary | ICD-10-CM | POA: Insufficient documentation

## 2019-04-04 NOTE — Progress Notes (Signed)
   Covid-19 Vaccination Clinic  Name:  Bradley Peterson    MRN: [id number] DOB: 16-Aug-1940  04/04/2019  Bradley Peterson was observed post Covid-19 immunization for 15 minutes without incidence. He was provided with Vaccine Information Sheet and instruction to access the V-Safe system.   Bradley Peterson was instructed to call 911 with any severe reactions post vaccine: Marland Kitchen Difficulty breathing  . Swelling of your face and throat  . A fast heartbeat  . A bad rash all over your body  . Dizziness and weakness    Immunizations Administered    Name Date Dose VIS Date Route   Pfizer COVID-19 Vaccine 04/04/2019  2:33 PM 0.3 mL 01/30/2019 Intramuscular   Manufacturer: [place]   Lot: [id number]   [place]: [id number]

## 2019-04-27 ENCOUNTER — Ambulatory Visit: Payer: Medicare HMO | Attending: Internal Medicine

## 2019-04-27 DIAGNOSIS — Z23 Encounter for immunization: Secondary | ICD-10-CM | POA: Insufficient documentation

## 2019-04-27 NOTE — Progress Notes (Signed)
   Covid-19 Vaccination Clinic  Name:  Bradley Peterson    MRN: [id number] DOB: October 20, 1940  04/27/2019  [name] was observed post Covid-19 immunization for 15 minutes without incident. He was provided with Vaccine Information Sheet and instruction to access the V-Safe system.   [name] was instructed to call 911 with any severe reactions post vaccine: Marland Kitchen Difficulty breathing  . Swelling of face and throat  . A fast heartbeat  . A bad rash all over body  . Dizziness and weakness   Immunizations Administered    Name Date Dose VIS Date Route   Pfizer COVID-19 Vaccine 04/27/2019 12:49 PM 0.3 mL 01/30/2019 Intramuscular   Manufacturer: [place]   Lot: [id number]   [place]: [id number]      Covid-19 Vaccination Clinic  Name:  Bradley Peterson    MRN: [id number] DOB: 1940-11-23  04/27/2019  [name] was observed post Covid-19 immunization for 15 minutes without incident. He was provided with Vaccine Information Sheet and instruction to access the V-Safe system.   [name] was instructed to call 911 with any severe reactions post vaccine: Marland Kitchen Difficulty breathing  . Swelling of face and throat  . A fast heartbeat  . A bad rash all over body  . Dizziness and weakness   Immunizations Administered    Name Date Dose VIS Date Route   Pfizer COVID-19 Vaccine 04/27/2019 12:49 PM 0.3 mL 01/30/2019 Intramuscular   Manufacturer: [place]   Lot: [id number]   [place]: [id number]

## 2020-01-11 ENCOUNTER — Encounter: Payer: Self-pay | Admitting: Family Medicine

## 2020-01-11 ENCOUNTER — Other Ambulatory Visit (HOSPITAL_BASED_OUTPATIENT_CLINIC_OR_DEPARTMENT_OTHER): Payer: Self-pay | Admitting: Internal Medicine

## 2020-01-11 ENCOUNTER — Ambulatory Visit: Payer: Medicare HMO | Attending: Internal Medicine

## 2020-01-11 ENCOUNTER — Other Ambulatory Visit: Payer: Self-pay

## 2020-01-11 ENCOUNTER — Ambulatory Visit: Payer: Medicare HMO | Admitting: Family Medicine

## 2020-01-11 VITALS — BP 112/66 | HR 65 | Temp 98.3°F | Resp 16 | Ht 69.0 in | Wt 159.8 lb

## 2020-01-11 DIAGNOSIS — M47812 Spondylosis without myelopathy or radiculopathy, cervical region: Secondary | ICD-10-CM

## 2020-01-11 DIAGNOSIS — D649 Anemia, unspecified: Secondary | ICD-10-CM

## 2020-01-11 DIAGNOSIS — Z0000 Encounter for general adult medical examination without abnormal findings: Secondary | ICD-10-CM | POA: Diagnosis not present

## 2020-01-11 DIAGNOSIS — Z9889 Other specified postprocedural states: Secondary | ICD-10-CM

## 2020-01-11 DIAGNOSIS — B9689 Other specified bacterial agents as the cause of diseases classified elsewhere: Secondary | ICD-10-CM

## 2020-01-11 DIAGNOSIS — Z13220 Encounter for screening for lipoid disorders: Secondary | ICD-10-CM

## 2020-01-11 DIAGNOSIS — Z125 Encounter for screening for malignant neoplasm of prostate: Secondary | ICD-10-CM

## 2020-01-11 DIAGNOSIS — Z86010 Personal history of colonic polyps: Secondary | ICD-10-CM

## 2020-01-11 DIAGNOSIS — Z1211 Encounter for screening for malignant neoplasm of colon: Secondary | ICD-10-CM

## 2020-01-11 DIAGNOSIS — J208 Acute bronchitis due to other specified organisms: Secondary | ICD-10-CM

## 2020-01-11 DIAGNOSIS — T7840XD Allergy, unspecified, subsequent encounter: Secondary | ICD-10-CM

## 2020-01-11 DIAGNOSIS — Z23 Encounter for immunization: Secondary | ICD-10-CM

## 2020-01-11 LAB — COMPREHENSIVE METABOLIC PANEL
ALT: 21 U/L (ref 0–53)
AST: 22 U/L (ref 0–37)
Albumin: 4.4 g/dL (ref 3.5–5.2)
Alkaline Phosphatase: 69 U/L (ref 39–117)
BUN: 18 mg/dL (ref 6–23)
CO2: 29 mEq/L (ref 19–32)
Calcium: 9.6 mg/dL (ref 8.4–10.5)
Chloride: 104 mEq/L (ref 96–112)
Creatinine, Ser: 0.87 mg/dL (ref 0.40–1.50)
GFR: 82.79 mL/min (ref >60.00)
Glucose, Bld: 100 mg/dL — ABNORMAL HIGH (ref 70–99)
Potassium: 4.5 mEq/L (ref 3.5–5.1)
Sodium: 140 mEq/L (ref 135–145)
Total Bilirubin: 0.6 mg/dL (ref 0.2–1.2)
Total Protein: 6.9 g/dL (ref 6.0–8.3)

## 2020-01-11 LAB — LIPID PANEL
Cholesterol: 155 mg/dL (ref 0–200)
HDL: 60.9 mg/dL (ref >39.00)
LDL Cholesterol: 85 mg/dL (ref 0–99)
NonHDL: 94.11
Total CHOL/HDL Ratio: 3
Triglycerides: 48 mg/dL (ref 0.0–149.0)
VLDL: 9.6 mg/dL (ref 0.0–40.0)

## 2020-01-11 LAB — CBC WITH DIFFERENTIAL/PLATELET
Basophils Absolute: 0 10*3/uL (ref 0.0–0.1)
Basophils Relative: 0.7 % (ref 0.0–3.0)
Eosinophils Absolute: 0.1 10*3/uL (ref 0.0–0.7)
Eosinophils Relative: 2 % (ref 0.0–5.0)
HCT: 41 % (ref 39.0–52.0)
Hemoglobin: 13.7 g/dL (ref 13.0–17.0)
Lymphocytes Relative: 20.3 % (ref 12.0–46.0)
Lymphs Abs: 1.2 10*3/uL (ref 0.7–4.0)
MCHC: 33.6 g/dL (ref 30.0–36.0)
MCV: 91.7 fl (ref 78.0–100.0)
Monocytes Absolute: 0.5 10*3/uL (ref 0.1–1.0)
Monocytes Relative: 9 % (ref 3.0–12.0)
Neutro Abs: 4 10*3/uL (ref 1.4–7.7)
Neutrophils Relative %: 68 % (ref 43.0–77.0)
Platelets: 247 10*3/uL (ref 150.0–400.0)
RBC: 4.47 Mil/uL (ref 4.22–5.81)
RDW: 13.6 % (ref 11.5–15.5)
WBC: 5.9 10*3/uL (ref 4.0–10.5)

## 2020-01-11 LAB — TSH: TSH: 1.09 u[IU]/mL (ref 0.35–4.50)

## 2020-01-11 LAB — PSA: PSA: 0.84 ng/mL (ref 0.10–4.00)

## 2020-01-11 MED ORDER — FLUTICASONE PROPIONATE 50 MCG/ACT NA SUSP: 2.0000 | NASAL | 3 refills | Status: AC | PRN

## 2020-01-11 MED FILL — PFIZER-BIONTECH COVID-19 VA: 30 | 1 days supply | Qty: 0 | Fill #0

## 2020-01-11 NOTE — Progress Notes (Signed)
   Covid-19 Vaccination Clinic  Name:  Bradley Peterson    MRN: [id number] DOB: [birth year]  01/11/2020  Mr. Bradley Peterson was observed post Covid-19 immunization for 15 minutes without incident. He was provided with Vaccine Information Sheet and instruction to access the V-Safe system.   Mr. Bradley Peterson was instructed to call 911 with any severe reactions post vaccine: Marland Kitchen Difficulty breathing  . Swelling of face and throat  . A fast heartbeat  . A bad rash all over body  . Dizziness and weakness   Immunizations Administered    Name Date Dose VIS Date Route   Pfizer COVID-19 Vaccine 01/11/2020  9:09 AM 0.3 mL 12/09/2019 Intramuscular   Manufacturer: [place]   Lot: [id number]   [place]: [id number]     Vaccine given by Beckey Rutter, pharmacy student

## 2020-01-11 NOTE — Progress Notes (Signed)
Subjective:    Patient ID: Bradley Peterson, male    DOB: 11-30-1940, 79 y.o.   MRN: [id number]  Chief Complaint  Patient presents with  . Annual Exam    HPI Patient is in today for annual preventative exam and follow up on chronic medical concerns. No recent febrile illness or hospitalizations. He has continued to stay active and eat a heart healthy diet. He continues to struggle with daily pain in his neck but it is not debilitating him. Denies CP/palp/SOB/HA/congestion/fevers/GI or GU c/o. Taking meds as prescribed  Past Medical History:  Diagnosis Date  . Allergic state 12/27/2013  . Arthritis of neck   . Positive TB test     Past Surgical History:  Procedure Laterality Date  . DENTAL SURGERY    . TONSILLECTOMY     Late 74s    Family History  Problem Relation Age of Onset  . Heart disease Father        CABG at 41, died age 92  . Arthritis Father   . Arthritis Mother        Living 74  . Hypertension Mother   . Hyperlipidemia Mother   . Stroke Mother     Social History   Socioeconomic History  . Marital status: Married    Spouse name: Not on file  . Number of children: 3  . Years of education: Not on file  . Highest education level: Not on file  Occupational History  . Not on file  Tobacco Use  . Smoking status: Former Smoker    Types: Pipe    Start date: 02/19/1977  . Smokeless tobacco: Never Used  . Tobacco comment: >25 years ago, pipe only.  Vaping Use  . Vaping Use: Never used  Substance and Sexual Activity  . Alcohol use: Yes    Comment: Maybe 1/2 drink a week  . Drug use: No  . Sexual activity: Yes    Comment: lives with wife, retired from education, [place], no major dietary restrictions  Other Topics Concern  . Not on file  Social History Narrative   Retired Pharmacist, community.    Lives with wife   No major dietary restrictions   Social Determinants of Health   Financial Resource Strain:   . Difficulty of Paying  Living Expenses: Not on file  Food Insecurity:   . Worried About Charity fundraiser in the Last Year: Not on file  . Ran Out of Food in the Last Year: Not on file  Transportation Needs:   . Lack of Transportation (Medical): Not on file  . Lack of Transportation (Non-Medical): Not on file  Physical Activity:   . Days of Exercise per Week: Not on file  . Minutes of Exercise per Session: Not on file  Stress:   . Feeling of Stress : Not on file  Social Connections:   . Frequency of Communication with Friends and Family: Not on file  . Frequency of Social Gatherings with Friends and Family: Not on file  . Attends Religious Services: Not on file  . Active Member of Clubs or Organizations: Not on file  . Attends Archivist Meetings: Not on file  . Marital Status: Not on file  Intimate Partner Violence:   . Fear of Current or Ex-Partner: Not on file  . Emotionally Abused: Not on file  . Physically Abused: Not on file  . Sexually Abused: Not on file    Outpatient Medications Prior to  Visit  Medication Sig Dispense Refill  . zinc gluconate 50 MG tablet Take 50 mg by mouth daily.    . Cholecalciferol (VITAMIN D3) 2000 UNITS capsule Take 2,000 Units by mouth daily.    . Ferrous Sulfate (IRON) 90 (18 Fe) MG TABS Take 1 capsule by mouth.    . fluticasone (FLONASE) 50 MCG/ACT nasal spray Place 2 sprays into both nostrils daily. (Patient taking differently: Place 2 sprays into both nostrils as needed. ) 16 g 3   No facility-administered medications prior to visit.    No Known Allergies  Review of Systems  Constitutional: Negative for chills, fever and malaise/fatigue.  HENT: Negative for congestion and hearing loss.   Eyes: Negative for discharge.  Respiratory: Negative for cough, sputum production and shortness of breath.   Cardiovascular: Negative for chest pain, palpitations and leg swelling.  Gastrointestinal: Negative for abdominal pain, blood in stool, constipation,  diarrhea, heartburn, nausea and vomiting.  Genitourinary: Negative for dysuria, frequency, hematuria and urgency.  Musculoskeletal: Positive for neck pain. Negative for back pain, falls and myalgias.  Skin: Negative for rash.  Neurological: Negative for dizziness, sensory change, loss of consciousness, weakness and headaches.  Endo/Heme/Allergies: Negative for environmental allergies. Does not bruise/bleed easily.  Psychiatric/Behavioral: Negative for depression and suicidal ideas. The patient is not nervous/anxious and does not have insomnia.        Objective:    Physical Exam Vitals and nursing note reviewed.  Constitutional:      General: He is not in acute distress.    Appearance: He is well-developed.  HENT:     Head: Normocephalic and atraumatic.     Nose: Nose normal.  Eyes:     General:        Right eye: No discharge.        Left eye: No discharge.  Cardiovascular:     Rate and Rhythm: Normal rate and regular rhythm.     Heart sounds: No murmur heard.   Pulmonary:     Effort: Pulmonary effort is normal.     Breath sounds: Normal breath sounds.  Abdominal:     General: Bowel sounds are normal.     Palpations: Abdomen is soft.     Tenderness: There is no abdominal tenderness.  Musculoskeletal:     Cervical back: Normal range of motion and neck supple.  Skin:    General: Skin is warm and dry.  Neurological:     Mental Status: He is alert and oriented to person, place, and time.     BP 112/66   Pulse 65   Temp 98.3 F (36.8 C) (Oral)   Resp 16   Ht 5\' 9"  (1.753 m)   Wt 159 lb 12.8 oz (72.5 kg)   SpO2 98%   BMI 23.60 kg/m  Wt Readings from Last 3 Encounters:  01/11/20 159 lb 12.8 oz (72.5 kg)  01/07/19 160 lb (72.6 kg)  01/06/18 165 lb 3.2 oz (74.9 kg)    Diabetic Foot Exam - Simple   No data filed     Lab Results  Component Value Date   WBC 7.4 01/07/2019   HGB 13.4 01/07/2019   HCT 39.9 01/07/2019   PLT 251.0 01/07/2019   GLUCOSE 86  01/07/2019   CHOL 164 01/07/2019   TRIG 87.0 01/07/2019   HDL 55.40 01/07/2019   LDLCALC 91 01/07/2019   ALT 18 01/07/2019   AST 21 01/07/2019   NA 140 01/07/2019   K 3.9 01/07/2019   CL  103 01/07/2019   CREATININE 0.77 01/07/2019   BUN 13 01/07/2019   CO2 27 01/07/2019   TSH 0.89 01/06/2018   PSA 0.93 01/07/2019   HGBA1C 6.0 01/07/2019    Lab Results  Component Value Date   TSH 0.89 01/06/2018   Lab Results  Component Value Date   WBC 7.4 01/07/2019   HGB 13.4 01/07/2019   HCT 39.9 01/07/2019   MCV 92.4 01/07/2019   PLT 251.0 01/07/2019   Lab Results  Component Value Date   NA 140 01/07/2019   K 3.9 01/07/2019   CO2 27 01/07/2019   GLUCOSE 86 01/07/2019   BUN 13 01/07/2019   CREATININE 0.77 01/07/2019   BILITOT 0.6 01/07/2019   ALKPHOS 65 01/07/2019   AST 21 01/07/2019   ALT 18 01/07/2019   PROT 6.9 01/07/2019   ALBUMIN 4.5 01/07/2019   CALCIUM 9.6 01/07/2019   ANIONGAP 6 01/31/2016   GFR 97.90 01/07/2019   Lab Results  Component Value Date   CHOL 164 01/07/2019   Lab Results  Component Value Date   HDL 55.40 01/07/2019   Lab Results  Component Value Date   LDLCALC 91 01/07/2019   Lab Results  Component Value Date   TRIG 87.0 01/07/2019   Lab Results  Component Value Date   CHOLHDL 3 01/07/2019   Lab Results  Component Value Date   HGBA1C 6.0 01/07/2019       Assessment & Plan:   Problem List Items Addressed This Visit    Osteoarthritis of neck    Encouraged moist heat and gentle stretching as tolerated. May try NSAIDs and prescription meds as directed and report if symptoms worsen or seek immediate care      Anemia    Donates blood regularly, last donated on 11/12. Check cbc today      Allergy    Increased congestion this year. Using Flonase prn with good. Can use antihistamines prn      Preventative health care - Primary    Patient encouraged to maintain heart healthy diet, regular exercise, adequate sleep. Consider daily  probiotics. Take medications as prescribed. He has had his flu shot and agrees to COVID booster      Relevant Orders   CBC with Differential/Platelet   Comprehensive metabolic panel   TSH   Lipid panel   PSA   H/O colonoscopy with polypectomy    Records requested and patient referred for repeat colonoscopy       Other Visit Diagnoses    Screening for hyperlipidemia       Relevant Orders   Lipid panel   Colon cancer screening       Relevant Orders   Ambulatory referral to Gastroenterology   Acute bacterial bronchitis       Relevant Medications   fluticasone (FLONASE) 50 MCG/ACT nasal spray      I have changed Bradley Sites "Jim"'s fluticasone. I am also having him maintain his Vitamin D3, Iron, and zinc gluconate.  Meds ordered this encounter  Medications  . fluticasone (FLONASE) 50 MCG/ACT nasal spray    Sig: Place 2 sprays into both nostrils as needed.    Dispense:  16 g    Refill:  3     Penni Homans, MD

## 2020-01-11 NOTE — Assessment & Plan Note (Signed)
Increased congestion this year. Using Flonase prn with good. Can use antihistamines prn

## 2020-01-11 NOTE — Assessment & Plan Note (Signed)
Records requested and patient referred for repeat colonoscopy

## 2020-01-11 NOTE — Assessment & Plan Note (Signed)
Donates blood regularly, last donated on 11/12. Check cbc today

## 2020-01-11 NOTE — Assessment & Plan Note (Addendum)
Patient encouraged to maintain heart healthy diet, regular exercise, adequate sleep. Consider daily probiotics. Take medications as prescribed. He has had his flu shot and agrees to COVID booster

## 2020-01-11 NOTE — Patient Instructions (Signed)

## 2020-01-11 NOTE — Assessment & Plan Note (Signed)
Encouraged moist heat and gentle stretching as tolerated. May try NSAIDs and prescription meds as directed and report if symptoms worsen or seek immediate care 

## 2020-01-28 ENCOUNTER — Encounter: Payer: Self-pay | Admitting: Family Medicine

## 2020-01-28 NOTE — Telephone Encounter (Signed)
See pt's other message. He found some of the cscope records at home. He will drop them by the office. However I also recommended that he take them by [facility] if possible.

## 2020-02-02 ENCOUNTER — Telehealth: Payer: Self-pay | Admitting: Family Medicine

## 2020-02-02 NOTE — Telephone Encounter (Signed)
Pt dropped off document to be seen by provider and to have on his chart ( 4 pages copies of [place]) Document put at front office tray under providers name.

## 2020-02-04 NOTE — Telephone Encounter (Signed)
Document sent to scan.

## 2020-03-17 ENCOUNTER — Encounter: Payer: Self-pay | Admitting: Family Medicine

## 2020-03-18 ENCOUNTER — Encounter: Payer: Self-pay | Admitting: *Deleted

## 2020-04-01 ENCOUNTER — Encounter: Payer: Self-pay | Admitting: *Deleted

## 2020-05-19 ENCOUNTER — Telehealth: Payer: Self-pay | Admitting: *Deleted

## 2020-05-19 NOTE — Telephone Encounter (Signed)
Ok as direct I reviewed recent PCP note

## 2020-05-19 NOTE — Telephone Encounter (Signed)
Dr Hilarie Fredrickson,  This is a 80 year old gentleman that has been scheduled for a screening colon with you on 4-28 Thursday.   He had a colon in 2012 with [facility], in Epic and had a a normal colon with a poor prep. Due to his age, Do you want an OV prior to his colon or is it okay to proceed as scheduled?  Please advise- Thanks for your time,Marie

## 2020-06-02 ENCOUNTER — Other Ambulatory Visit: Payer: Self-pay

## 2020-06-02 ENCOUNTER — Ambulatory Visit (AMBULATORY_SURGERY_CENTER): Payer: Self-pay | Admitting: *Deleted

## 2020-06-02 VITALS — Ht 69.0 in | Wt 162.6 lb

## 2020-06-02 DIAGNOSIS — Z1211 Encounter for screening for malignant neoplasm of colon: Secondary | ICD-10-CM

## 2020-06-02 MED ORDER — PEG 3350-KCL-NA BICARB-NACL 420 G PO SOLR: 4000.0000 mL | Freq: Once | ORAL | 0 refills | Status: AC

## 2020-06-02 NOTE — Progress Notes (Signed)
No egg or soy allergy known to patient  No issues with past sedation with any surgeries or procedures Patient denies ever being told they had issues or difficulty with intubation  No FH of Malignant Hyperthermia No diet pills per patient No home 02 use per patient  No blood thinners per patient  Pt denies issues with constipation  No A fib or A flutter  EMMI video to pt or via [place] 19 guidelines implemented in [place] today with Pt and RN  Pt is fully vaccinated  for Covid   Will do a 2 day [place];y prep= last colon poor prep   Due to the COVID-19 pandemic we are asking patients to follow certain guidelines.  Pt aware of COVID protocols and [facility] guidelines

## 2020-06-03 ENCOUNTER — Encounter: Payer: Self-pay | Admitting: Internal Medicine

## 2020-06-16 ENCOUNTER — Other Ambulatory Visit: Payer: Self-pay

## 2020-06-16 ENCOUNTER — Ambulatory Visit (AMBULATORY_SURGERY_CENTER): Payer: Medicare HMO | Admitting: Internal Medicine

## 2020-06-16 ENCOUNTER — Encounter: Payer: Self-pay | Admitting: Internal Medicine

## 2020-06-16 VITALS — BP 118/76 | HR 50 | Temp 97.8°F | Resp 17 | Ht 69.0 in | Wt 162.6 lb

## 2020-06-16 DIAGNOSIS — Z1211 Encounter for screening for malignant neoplasm of colon: Secondary | ICD-10-CM | POA: Diagnosis present

## 2020-06-16 MED ORDER — SODIUM CHLORIDE 0.9 % IV SOLN: 500.0000 mL | Freq: Once | INTRAVENOUS | Status: DC

## 2020-06-16 NOTE — Patient Instructions (Signed)
YOU HAD AN ENDOSCOPIC PROCEDURE TODAY AT [facility]:   Refer to the procedure report that was given to you for any specific questions about what was found during the examination.  If the procedure report does not answer your questions, please call your gastroenterologist to clarify.  If you requested that your care partner not be given the details of your procedure findings, then the procedure report has been included in a sealed envelope for you to review at your convenience later.  YOU SHOULD EXPECT: Some feelings of bloating in the abdomen. Passage of more gas than usual.  Walking can help get rid of the air that was put into your GI tract during the procedure and reduce the bloating. If you had a lower endoscopy (such as a colonoscopy or flexible sigmoidoscopy) you may notice spotting of blood in your stool or on the toilet paper. If you underwent a bowel prep for your procedure, you may not have a normal bowel movement for a few days.  Please Note:  You might notice some irritation and congestion in your nose or some drainage.  This is from the oxygen used during your procedure.  There is no need for concern and it should clear up in a day or so.  SYMPTOMS TO REPORT IMMEDIATELY:   Following lower endoscopy (colonoscopy or flexible sigmoidoscopy):  Excessive amounts of blood in the stool  Significant tenderness or worsening of abdominal pains  Swelling of the abdomen that is new, acute  Fever of 100F or higher    For urgent or emergent issues, a gastroenterologist can be reached at any hour by calling [phone]. Do not use MyChart messaging for urgent concerns.    DIET:  We do recommend a small meal at first, but then you may proceed to your regular diet.  Drink plenty of fluids but you should avoid alcoholic beverages for 24 hours.  ACTIVITY:  You should plan to take it easy for the rest of today and you should NOT DRIVE or use heavy machinery until tomorrow  (because of the sedation medicines used during the test).    FOLLOW UP: Our staff will call the number listed on your records 48-72 hours following your procedure to check on you and address any questions or concerns that you may have regarding the information given to you following your procedure. If we do not reach you, we will leave a message.  We will attempt to reach you two times.  During this call, we will ask if you have developed any symptoms of COVID 19. If you develop any symptoms (ie: fever, flu-like symptoms, shortness of breath, cough etc.) before then, please call [phone].  If you test positive for Covid 19 in the 2 weeks post procedure, please call and report this information to [place].    If any biopsies were taken you will be contacted by phone or by letter within the next 1-3 weeks.  Please call us at [phone] if you have not heard about the biopsies in 3 weeks.    SIGNATURES/CONFIDENTIALITY: You and/or your care partner have signed paperwork which will be entered into your electronic medical record.  These signatures attest to the fact that that the information above on your After Visit Summary has been reviewed and is understood.  Full responsibility of the confidentiality of this discharge information lies with you and/or your care-partner.   Resume medicatins. Information given on diverticulosis.

## 2020-06-16 NOTE — Op Note (Signed)
[facility] Patient Name: Bradley Peterson Procedure Date: 06/16/2020 10:52 AM MRN: [id number] Endoscopist: Jerene Bears , MD Age: 80 Referring MD:  Date of Birth: [birth year] Gender: Male Account #: [id number] Procedure:                Colonoscopy Indications:              Screening for colorectal malignant neoplasm, Last                            colonoscopy 10 years ago Medicines:                Monitored Anesthesia Care Procedure:                Pre-Anesthesia Assessment:                           - Prior to the procedure, a History and Physical                            was performed, and patient medications and                            allergies were reviewed. The patient's tolerance of                            previous anesthesia was also reviewed. The risks                            and benefits of the procedure and the sedation                            options and risks were discussed with the patient.                            All questions were answered, and informed consent                            was obtained. Prior Anticoagulants: The patient has                            taken no previous anticoagulant or antiplatelet                            agents. ASA Grade Assessment: II - A patient with                            mild systemic disease. After reviewing the risks                            and benefits, the patient was deemed in                            satisfactory condition to undergo the procedure.  After obtaining informed consent, the colonoscope                            was passed under direct vision. Throughout the                            procedure, the patient's blood pressure, pulse, and                            oxygen saturations were monitored continuously. The                            Olympus [id number]) [id number] was introduced                            through the anus and advanced to the  cecum,                            identified by appendiceal orifice and ileocecal                            valve. The colonoscopy was performed without                            difficulty. The patient tolerated the procedure                            well. The quality of the bowel preparation was                            excellent. The ileocecal valve, appendiceal                            orifice, and rectum were photographed. Scope In: 11:05:22 AM Scope Out: 11:15:58 AM Scope Withdrawal Time: 0 hours 7 minutes 46 seconds  Total Procedure Duration: 0 hours 10 minutes 36 seconds  Findings:                 The digital rectal exam was normal.                           Multiple small-mouthed diverticula were found in                            the sigmoid colon.                           Internal hemorrhoids were found during                            retroflexion. The hemorrhoids were medium-sized.                           The exam was otherwise without abnormality. Complications:            No immediate complications. Estimated Blood Loss:  Estimated blood loss: none. Impression:               - Diverticulosis in the sigmoid colon.                           - Internal hemorrhoids.                           - The examination was otherwise normal.                           - No specimens collected. Recommendation:           - Patient has a contact number available for                            emergencies. The signs and symptoms of potential                            delayed complications were discussed with the                            patient. Return to normal activities tomorrow.                            Written discharge instructions were provided to the                            patient.                           - Resume previous diet.                           - Continue present medications.                           - No repeat colonoscopy due to age at next                             screening interval and the absence of colonic                            polyps. Jerene Bears, MD 06/16/2020 11:19:05 AM This report has been signed electronically.

## 2020-06-16 NOTE — Progress Notes (Signed)
Pt Drowsy. VSS. To PACU, report to RN. No anesthetic complications noted.  

## 2020-06-16 NOTE — Progress Notes (Signed)
Medical history reviewed with no changes noted since PV. VS assessed by C.W 

## 2020-06-20 ENCOUNTER — Telehealth: Payer: Self-pay

## 2020-06-20 NOTE — Telephone Encounter (Signed)
LVM

## 2020-07-11 ENCOUNTER — Ambulatory Visit: Payer: Medicare HMO | Admitting: Family Medicine

## 2020-07-11 ENCOUNTER — Encounter: Payer: Self-pay | Admitting: Family Medicine

## 2020-07-11 ENCOUNTER — Other Ambulatory Visit: Payer: Self-pay

## 2020-07-11 DIAGNOSIS — M542 Cervicalgia: Secondary | ICD-10-CM | POA: Insufficient documentation

## 2020-07-11 DIAGNOSIS — T7840XD Allergy, unspecified, subsequent encounter: Secondary | ICD-10-CM

## 2020-07-11 NOTE — Progress Notes (Signed)
Subjective:    Patient ID: Bradley Peterson, male    DOB: [birth year], 80 y.o.   MRN: [id number]  Chief Complaint  Patient presents with  . Follow-up    Pt states that he is having issues with hay fever.    HPI Patient is in today for follow up on chronic medical concerns including allergies. He feels well. No recent febrile illness or hospitalizations. He has had a flare in congestion and flonase helps some but still some sinus pressure and a mild sense of disequilibrium infrequently. No fevers or chills. No spinning. Also notes some stiff neck at times but no falls or injury. Denies CP/palp/SOB/HA/congestion/fevers/GI or GU c/o. Taking meds as prescribed  Past Medical History:  Diagnosis Date  . Allergic state 12/27/2013  . Allergy   . Arthritis of neck   . Heart murmur   . Positive TB test     Past Surgical History:  Procedure Laterality Date  . COLONOSCOPY  01/2011  . DENTAL SURGERY    . TONSILLECTOMY     Late 1940s  . UPPER GASTROINTESTINAL ENDOSCOPY  01/2011    Family History  Problem Relation Age of Onset  . Heart disease Father        CABG at 30, died age 34  . Arthritis Father   . Arthritis Mother        Living 32  . Hypertension Mother   . Hyperlipidemia Mother   . Stroke Mother   . Colon cancer Neg Hx   . Colon polyps Neg Hx   . Esophageal cancer Neg Hx   . Rectal cancer Neg Hx   . Stomach cancer Neg Hx     Social History   Socioeconomic History  . Marital status: Married    Spouse name: Not on file  . Number of children: 3  . Years of education: Not on file  . Highest education level: Not on file  Occupational History  . Not on file  Tobacco Use  . Smoking status: Former Smoker    Types: Pipe    Start date: 02/19/1977  . Smokeless tobacco: Never Used  . Tobacco comment: >25 years ago, pipe only.  Vaping Use  . Vaping Use: Never used  Substance and Sexual Activity  . Alcohol use: Yes    Comment: Maybe 1/2 drink a week- seldom   . Drug use:  No  . Sexual activity: Yes    Comment: lives with wife, retired from education, [place], no major dietary restrictions  Other Topics Concern  . Not on file  Social History Narrative   Retired Pharmacist, community.    Lives with wife   No major dietary restrictions   Social Determinants of Health   Financial Resource Strain: Not on file  Food Insecurity: Not on file  Transportation Needs: Not on file  Physical Activity: Not on file  Stress: Not on file  Social Connections: Not on file  Intimate Partner Violence: Not on file    Outpatient Medications Prior to Visit  Medication Sig Dispense Refill  . Chlorpheniramine Maleate (ALLERGY PO) Take by mouth. [employer] brand allergy med PRN only    . Cholecalciferol (VITAMIN D3) 2000 UNITS capsule Take 2,000 Units by mouth daily.    Marland Kitchen COVID-19 mRNA vaccine, Pfizer, 30 MCG/0.3ML injection AS DIRECTED .3 mL 0  . Ferrous Sulfate (IRON) 90 (18 Fe) MG TABS Take 1 capsule by mouth.    . fluticasone (FLONASE) 50 MCG/ACT nasal spray Place  2 sprays into both nostrils as needed. 16 g 3  . ZINC-VITAMIN C PO Take by mouth.     No facility-administered medications prior to visit.    No Known Allergies  Review of Systems  Constitutional: Negative for fever and malaise/fatigue.  HENT: Positive for congestion.   Eyes: Negative for blurred vision.  Respiratory: Negative for shortness of breath.   Cardiovascular: Negative for chest pain, palpitations and leg swelling.  Gastrointestinal: Negative for abdominal pain, blood in stool and nausea.  Genitourinary: Negative for dysuria and frequency.  Musculoskeletal: Negative for falls.  Skin: Negative for rash.  Neurological: Negative for dizziness, loss of consciousness and headaches.  Endo/Heme/Allergies: Negative for environmental allergies.  Psychiatric/Behavioral: Negative for depression. The patient is not nervous/anxious.        Objective:    Physical Exam Vitals and nursing  note reviewed.  Constitutional:      General: He is not in acute distress.    Appearance: He is well-developed.  HENT:     Head: Normocephalic and atraumatic.     Nose: Nose normal.  Eyes:     General:        Right eye: No discharge.        Left eye: No discharge.  Cardiovascular:     Rate and Rhythm: Normal rate and regular rhythm.     Heart sounds: No murmur heard.   Pulmonary:     Effort: Pulmonary effort is normal.     Breath sounds: Normal breath sounds.  Abdominal:     General: Bowel sounds are normal.     Palpations: Abdomen is soft.     Tenderness: There is no abdominal tenderness.  Musculoskeletal:     Cervical back: Normal range of motion and neck supple.  Skin:    General: Skin is warm and dry.  Neurological:     Mental Status: He is alert and oriented to person, place, and time.     BP 130/74   Pulse 60   Temp 97.9 F (36.6 C)   Resp 16   Wt 156 lb (70.8 kg)   SpO2 96%   BMI 23.04 kg/m  Wt Readings from Last 3 Encounters:  07/11/20 156 lb (70.8 kg)  06/16/20 162 lb 9.6 oz (73.8 kg)  06/02/20 162 lb 9.6 oz (73.8 kg)    Diabetic Foot Exam - Simple   No data filed    Lab Results  Component Value Date   WBC 5.9 01/11/2020   HGB 13.7 01/11/2020   HCT 41.0 01/11/2020   PLT 247.0 01/11/2020   GLUCOSE 100 (H) 01/11/2020   CHOL 155 01/11/2020   TRIG 48.0 01/11/2020   HDL 60.90 01/11/2020   LDLCALC 85 01/11/2020   ALT 21 01/11/2020   AST 22 01/11/2020   NA 140 01/11/2020   K 4.5 01/11/2020   CL 104 01/11/2020   CREATININE 0.87 01/11/2020   BUN 18 01/11/2020   CO2 29 01/11/2020   TSH 1.09 01/11/2020   PSA 0.84 01/11/2020   HGBA1C 6.0 01/07/2019    Lab Results  Component Value Date   TSH 1.09 01/11/2020   Lab Results  Component Value Date   WBC 5.9 01/11/2020   HGB 13.7 01/11/2020   HCT 41.0 01/11/2020   MCV 91.7 01/11/2020   PLT 247.0 01/11/2020   Lab Results  Component Value Date   NA 140 01/11/2020   K 4.5 01/11/2020   CO2  29 01/11/2020   GLUCOSE 100 (H) 01/11/2020  BUN 18 01/11/2020   CREATININE 0.87 01/11/2020   BILITOT 0.6 01/11/2020   ALKPHOS 69 01/11/2020   AST 22 01/11/2020   ALT 21 01/11/2020   PROT 6.9 01/11/2020   ALBUMIN 4.4 01/11/2020   CALCIUM 9.6 01/11/2020   ANIONGAP 6 01/31/2016   GFR 82.79 01/11/2020   Lab Results  Component Value Date   CHOL 155 01/11/2020   Lab Results  Component Value Date   HDL 60.90 01/11/2020   Lab Results  Component Value Date   LDLCALC 85 01/11/2020   Lab Results  Component Value Date   TRIG 48.0 01/11/2020   Lab Results  Component Value Date   CHOLHDL 3 01/11/2020   Lab Results  Component Value Date   HGBA1C 6.0 01/07/2019       Assessment & Plan:   Problem List Items Addressed This Visit    Allergy    Flared with disequilibrium at times. Encouraged Claritin bid and flonase daily. Nasal saline prn.       Neck pain    Encouraged moist heat and gentle stretching as tolerated. May try NSAIDs and prescription meds as directed and report if symptoms worsen or seek immediate care         I am having [name] "Jim" maintain his Vitamin D3, Iron, fluticasone, COVID-19 mRNA vaccine (Pfizer), ZINC-VITAMIN C PO, and Chlorpheniramine Maleate (ALLERGY PO).  No orders of the defined types were placed in this encounter.    Penni Homans, MD

## 2020-07-11 NOTE — Assessment & Plan Note (Signed)
Encouraged moist heat and gentle stretching as tolerated. May try NSAIDs and prescription meds as directed and report if symptoms worsen or seek immediate care 

## 2020-07-11 NOTE — Assessment & Plan Note (Signed)
Flared with disequilibrium at times. Encouraged Claritin bid and flonase daily. Nasal saline prn.

## 2020-07-11 NOTE — Patient Instructions (Signed)
Allergies, Adult An allergy is a condition in which the body's defense system (immune system) comes in contact with an allergen and reacts to it. An allergen is anything that causes an allergic reaction. Allergens cause the immune system to make proteins for fighting infections (antibodies). These antibodies cause cells to release chemicals called histamines that set off the symptoms of an allergic reaction. Allergies often affect the nasal passages (allergic rhinitis), eyes (allergic conjunctivitis), skin (atopic dermatitis), and stomach. Allergies can be mild, moderate, or severe. They cannot spread from person to person. Allergies can develop at any age and may be outgrown. What are the causes? This condition is caused by allergens. Common allergens include:  Outdoor allergens, such as pollen, car fumes, and mold.  Indoor allergens, such as dust, smoke, mold, and pet dander.  Other allergens, such as foods, medicines, scents, insect bites or stings, and other skin irritants. What increases the risk? You are more likely to develop this condition if you have:  Family members with allergies.  Family members who have any condition that may be caused by allergens, such as asthma. This may make you more likely to have other allergies. What are the signs or symptoms? Symptoms of this condition depend on the severity of the allergy. Mild to moderate symptoms  Runny nose, stuffy nose (nasal congestion), or sneezing.  Itchy mouth, ears, or throat.  A feeling of mucus dripping down the back of your throat (postnasal drip).  Sore throat.  Itchy, red, watery, or puffy eyes.  Skin rash, or itchy, red, swollen areas of skin (hives).  Stomach cramps or bloating. Severe symptoms Severe allergies to food, medicine, or insect bites may cause anaphylaxis, which can be life-threatening. Symptoms include:  A red (flushed) face.  Wheezing or coughing.  Swollen lips, tongue, or mouth.  Tight or  swollen throat.  Chest pain or tightness, or rapid heartbeat.  Trouble breathing or shortness of breath.  Pain in the abdomen, vomiting, or diarrhea.  Dizziness or fainting. How is this diagnosed? This condition is diagnosed based on your symptoms, your family and medical history, and a physical exam. You may also have tests, including:  Skin tests to see how your skin reacts to allergens that may be causing your symptoms. Tests include: ? Skin prick test. For this test, an allergen is introduced to your body through a small opening in the skin. ? Intradermal skin test. For this test, a small amount of allergen is injected under the first layer of your skin. ? Patch test. For this test, a small amount of allergen is placed on your skin. The area is covered and then checked after a few days.  Blood tests.  A challenge test. For this test, you will eat or breathe in a small amount of allergen to see if you have an allergic reaction. You may also be asked to:  Keep a food diary. This is a record of all the foods, drinks, and symptoms you have in a day.  Try an elimination diet. To do this: ? Remove certain foods from your diet. ? Add those foods back one by one to find out if any foods cause an allergic reaction. How is this treated? Treatment for allergies depends on your symptoms. Treatment may include:  Cold, wet cloths (cold compresses) to soothe itching and swelling.  Eye drops or nasal sprays.  Nasal irrigation to help clear your mucus or keep the nasal passages moist.  A humidifier to add moisture to the   air.  Skin creams to treat rashes or itching.  Oral antihistamines or other medicines to block the reaction or to treat inflammation.  Diet changes to remove foods that cause allergies.  Being exposed again and again to tiny amounts of allergens to help you build a defense against it (tolerance). This is called immunotherapy. Examples include: ? Allergy shot. You  receive an injection that contains an allergen. ? Sublingual immunotherapy. You take a small dose of allergen under your tongue.  Emergency injection for anaphylaxis. You give yourself a shot using a syringe (auto-injector) that contains the amount of medicine you need. Your health care provider will teach you how to give yourself an injection.      Follow these instructions at home: Medicines  Take or apply over-the-counter and prescription medicines only as told by your health care provider.  Always carry your auto-injector pen if you are at risk of anaphylaxis. Give yourself an injection as told by your health care provider.   Eating and drinking  Follow instructions from your health care provider about eating or drinking restrictions.  Drink enough fluid to keep your urine pale yellow. General instructions  Wear a medical alert bracelet or necklace to let others know that you have had anaphylaxis before.  Avoid known allergens whenever possible.  Keep all follow-up visits as told by your health care provider. This is important. Contact a health care provider if:  Your symptoms do not get better with treatment. Get help right away if:  You have symptoms of anaphylaxis. These include: ? Swollen mouth, tongue, or throat. ? Pain or tightness in your chest. ? Trouble breathing or shortness of breath. ? Dizziness or fainting. ? Severe abdominal pain, vomiting, or diarrhea. These symptoms may represent a serious problem that is an emergency. Do not wait to see if the symptoms will go away. Get medical help right away. Call your local emergency services (911 in the [place] Do not drive yourself to the hospital. Summary  Take or apply over-the-counter and prescription medicines only as told by your health care provider.  Avoid known allergens when possible.  Always carry your auto-injector pen if you are at risk of anaphylaxis. Give yourself an injection as told by your health  care provider.  Wear a medical alert bracelet or necklace to let others know that you have had anaphylaxis before.  Anaphylaxis is a life-threatening emergency. Get help right away. This information is not intended to replace advice given to you by your health care provider. Make sure you discuss any questions you have with your health care provider. Document Revised: 10/05/2019 Document Reviewed: 12/17/2018 Elsevier Patient Education  2021 Elsevier Inc.  

## 2020-09-19 ENCOUNTER — Ambulatory Visit: Payer: Medicare HMO | Attending: Internal Medicine

## 2020-09-19 DIAGNOSIS — Z23 Encounter for immunization: Secondary | ICD-10-CM

## 2020-09-19 NOTE — Progress Notes (Signed)
   Covid-19 Vaccination Clinic  Name:  TRICIA GUILBEAU    MRN: [id number] DOB: 1940-10-28  09/19/2020  Mr. Radler was observed post Covid-19 immunization for 15 minutes without incident. He was provided with Vaccine Information Sheet and instruction to access the V-Safe system.   Mr. Pacha was instructed to call 911 with any severe reactions post vaccine: Difficulty breathing  Swelling of face and throat  A fast heartbeat  A bad rash all over body  Dizziness and weakness   Immunizations Administered     Name Date Dose VIS Date Route   PFIZER Comrnaty(Gray TOP) Covid-19 Vaccine 09/19/2020 12:59 PM 0.3 mL 01/28/2020 Intramuscular   Manufacturer: [place]   Lot: [id number]   NDC: [id number]

## 2020-09-22 ENCOUNTER — Other Ambulatory Visit (HOSPITAL_BASED_OUTPATIENT_CLINIC_OR_DEPARTMENT_OTHER): Payer: Self-pay

## 2020-09-22 MED ORDER — PFIZER-BIONT COVID-19 VAC-TRIS 30 MCG/0.3ML IM SUSP
INTRAMUSCULAR | 0 refills | Status: DC
  Filled 2020-09-22: qty 0.3, 1d supply, fill #0

## 2021-01-16 ENCOUNTER — Encounter: Payer: Medicare HMO | Admitting: Family Medicine

## 2021-01-19 ENCOUNTER — Encounter: Payer: Medicare HMO | Admitting: Family Medicine

## 2021-01-27 ENCOUNTER — Ambulatory Visit (INDEPENDENT_AMBULATORY_CARE_PROVIDER_SITE_OTHER): Payer: Medicare HMO | Admitting: Family Medicine

## 2021-01-27 VITALS — BP 108/64 | HR 61 | Temp 97.6°F | Resp 16 | Ht 67.0 in | Wt 160.2 lb

## 2021-01-27 DIAGNOSIS — Z1159 Encounter for screening for other viral diseases: Secondary | ICD-10-CM | POA: Diagnosis not present

## 2021-01-27 DIAGNOSIS — Z0000 Encounter for general adult medical examination without abnormal findings: Secondary | ICD-10-CM

## 2021-01-27 DIAGNOSIS — Z13220 Encounter for screening for lipoid disorders: Secondary | ICD-10-CM

## 2021-01-27 DIAGNOSIS — Z125 Encounter for screening for malignant neoplasm of prostate: Secondary | ICD-10-CM

## 2021-01-27 DIAGNOSIS — D649 Anemia, unspecified: Secondary | ICD-10-CM

## 2021-01-27 LAB — CBC WITH DIFFERENTIAL/PLATELET
Basophils Absolute: 0.1 10*3/uL (ref 0.0–0.1)
Basophils Relative: 0.8 % (ref 0.0–3.0)
Eosinophils Absolute: 0.1 10*3/uL (ref 0.0–0.7)
Eosinophils Relative: 1.7 % (ref 0.0–5.0)
HCT: 43.5 % (ref 39.0–52.0)
Hemoglobin: 14.7 g/dL (ref 13.0–17.0)
Lymphocytes Relative: 16.6 % (ref 12.0–46.0)
Lymphs Abs: 1.2 10*3/uL (ref 0.7–4.0)
MCHC: 33.7 g/dL (ref 30.0–36.0)
MCV: 91.6 fl (ref 78.0–100.0)
Monocytes Absolute: 0.6 10*3/uL (ref 0.1–1.0)
Monocytes Relative: 7.6 % (ref 3.0–12.0)
Neutro Abs: 5.5 10*3/uL (ref 1.4–7.7)
Neutrophils Relative %: 73.3 % (ref 43.0–77.0)
Platelets: 230 10*3/uL (ref 150.0–400.0)
RBC: 4.75 Mil/uL (ref 4.22–5.81)
RDW: 13.1 % (ref 11.5–15.5)
WBC: 7.5 10*3/uL (ref 4.0–10.5)

## 2021-01-27 LAB — COMPREHENSIVE METABOLIC PANEL
ALT: 19 U/L (ref 0–53)
AST: 22 U/L (ref 0–37)
Albumin: 4.3 g/dL (ref 3.5–5.2)
Alkaline Phosphatase: 66 U/L (ref 39–117)
BUN: 15 mg/dL (ref 6–23)
CO2: 27 mEq/L (ref 19–32)
Calcium: 9.8 mg/dL (ref 8.4–10.5)
Chloride: 104 mEq/L (ref 96–112)
Creatinine, Ser: 0.75 mg/dL (ref 0.40–1.50)
GFR: 85.96 mL/min (ref >60.00)
Glucose, Bld: 84 mg/dL (ref 70–99)
Potassium: 4.2 mEq/L (ref 3.5–5.1)
Sodium: 139 mEq/L (ref 135–145)
Total Bilirubin: 0.6 mg/dL (ref 0.2–1.2)
Total Protein: 6.9 g/dL (ref 6.0–8.3)

## 2021-01-27 LAB — LIPID PANEL
Cholesterol: 158 mg/dL (ref 0–200)
HDL: 59.8 mg/dL (ref >39.00)
LDL Cholesterol: 77 mg/dL (ref 0–99)
NonHDL: 98.63
Total CHOL/HDL Ratio: 3
Triglycerides: 109 mg/dL (ref 0.0–149.0)
VLDL: 21.8 mg/dL (ref 0.0–40.0)

## 2021-01-27 LAB — TSH: TSH: 0.79 u[IU]/mL (ref 0.35–5.50)

## 2021-01-27 LAB — HEMOGLOBIN A1C: Hgb A1c MFr Bld: 6.4 % (ref 4.6–6.5)

## 2021-01-27 LAB — PSA: PSA: 0.87 ng/mL (ref 0.10–4.00)

## 2021-01-27 NOTE — Patient Instructions (Signed)
Preventive Care 65 Years and Older, Male °Preventive care refers to lifestyle choices and visits with your health care provider that can promote health and wellness. Preventive care visits are also called wellness exams. °What can I expect for my preventive care visit? °Counseling °During your preventive care visit, your health care provider may ask about your: °Medical history, including: °Past medical problems. °Family medical history. °History of falls. °Current health, including: °Emotional well-being. °Home life and relationship well-being. °Sexual activity. °Memory and ability to understand (cognition). °Lifestyle, including: °Alcohol, nicotine or tobacco, and drug use. °Access to firearms. °Diet, exercise, and sleep habits. °Work and work environment. °Sunscreen use. °Safety issues such as seatbelt and bike helmet use. °Physical exam °Your health care provider will check your: °Height and weight. These may be used to calculate your BMI (body mass index). BMI is a measurement that tells if you are at a healthy weight. °Waist circumference. This measures the distance around your waistline. This measurement also tells if you are at a healthy weight and may help predict your risk of certain diseases, such as type 2 diabetes and high blood pressure. °Heart rate and blood pressure. °Body temperature. °Skin for abnormal spots. °What immunizations do I need? °Vaccines are usually given at various ages, according to a schedule. Your health care provider will recommend vaccines for you based on your age, medical history, and lifestyle or other factors, such as travel or where you work. °What tests do I need? °Screening °Your health care provider may recommend screening tests for certain conditions. This may include: °Lipid and cholesterol levels. °Diabetes screening. This is done by checking your blood sugar (glucose) after you have not eaten for a while (fasting). °Hepatitis C test. °Hepatitis B test. °HIV (human  immunodeficiency virus) test. °STI (sexually transmitted infection) testing, if you are at risk. °Lung cancer screening. °Colorectal cancer screening. °Prostate cancer screening. °Abdominal aortic aneurysm (AAA) screening. You may need this if you are a current or former smoker. °Talk with your health care provider about your test results, treatment options, and if necessary, the need for more tests. °Follow these instructions at home: °Eating and drinking ° °Eat a diet that includes fresh fruits and vegetables, whole grains, lean protein, and low-fat dairy products. Limit your intake of foods with high amounts of sugar, saturated fats, and salt. °Take vitamin and mineral supplements as recommended by your health care provider. °Do not drink alcohol if your health care provider tells you not to drink. °If you drink alcohol: °Limit how much you have to 0-2 drinks a day. °Know how much alcohol is in your drink. In the [place] one drink equals one 12 oz bottle of beer (355 mL), one 5 oz glass of wine (148 mL), or one 1½ oz glass of hard liquor (44 mL). °Lifestyle °Brush your teeth every morning and night with fluoride toothpaste. Floss one time each day. °Exercise for at least 30 minutes 5 or more days each week. °Do not use any products that contain nicotine or tobacco. These products include cigarettes, chewing tobacco, and vaping devices, such as e-cigarettes. If you need help quitting, ask your health care provider. °Do not use drugs. °If you are sexually active, practice safe sex. Use a condom or other form of protection to prevent STIs. °Take aspirin only as told by your health care provider. Make sure that you understand how much to take and what form to take. Work with your health care provider to find out whether it is safe and   beneficial for you to take aspirin daily. °Ask your health care provider if you need to take a cholesterol-lowering medicine (statin). °Find healthy ways to manage stress, such  as: °Meditation, yoga, or listening to music. °Journaling. °Talking to a trusted person. °Spending time with friends and family. °Safety °Always wear your seat belt while driving or riding in a vehicle. °Do not drive: °If you have been drinking alcohol. Do not ride with someone who has been drinking. °When you are tired or distracted. °While texting. °If you have been using any mind-altering substances or drugs. °Wear a helmet and other protective equipment during sports activities. °If you have firearms in your house, make sure you follow all gun safety procedures. °Minimize exposure to UV radiation to reduce your risk of skin cancer. °What's next? °Visit your health care provider once a year for an annual wellness visit. °Ask your health care provider how often you should have your eyes and teeth checked. °Stay up to date on all vaccines. °This information is not intended to replace advice given to you by your health care provider. Make sure you discuss any questions you have with your health care provider. °Document Revised: 08/03/2020 Document Reviewed: 08/03/2020 °Elsevier Patient Education © 2022 Elsevier Inc. ° °

## 2021-01-30 LAB — HEPATITIS C ANTIBODY
Hepatitis C Ab: NONREACTIVE
SIGNAL TO CUT-OFF: 0.04 (ref <1.00)

## 2021-01-30 NOTE — Assessment & Plan Note (Signed)
Patient encouraged to maintain heart healthy diet, regular exercise, adequate sleep. Consider daily probiotics. Take medications as prescribed. Labs ordered and reviewed. Had colonoscopy April 2022, has aged out of screening colonoscopy

## 2021-01-30 NOTE — Progress Notes (Signed)
Subjective:    Patient ID: Bradley Peterson, male    DOB: [birth year], 80 y.o.   MRN: [id number]  Chief Complaint  Patient presents with   Annual Exam    HPI Patient is in today for annual preventative exam. He is doing well. No recent febrile illness or hospitalizations. He has been eating well and staying active. Denies any acute concerns. Denies CP/palp/SOB/HA/congestion/fevers/GI or GU c/o. Taking meds as prescribed   Past Medical History:  Diagnosis Date   Allergic state 12/27/2013   Allergy    Arthritis of neck    Heart murmur    Positive TB test     Past Surgical History:  Procedure Laterality Date   COLONOSCOPY  01/2011   DENTAL SURGERY     TONSILLECTOMY     Late 1940s   UPPER GASTROINTESTINAL ENDOSCOPY  01/2011    Family History  Problem Relation Age of Onset   Heart disease Father        CABG at 28, died age 26   Arthritis Father    Arthritis Mother        Living 33   Hypertension Mother    Hyperlipidemia Mother    Stroke Mother    Colon cancer Neg Hx    Colon polyps Neg Hx    Esophageal cancer Neg Hx    Rectal cancer Neg Hx    Stomach cancer Neg Hx     Social History   Socioeconomic History   Marital status: Married    Spouse name: Not on file   Number of children: 3   Years of education: Not on file   Highest education level: Not on file  Occupational History   Not on file  Tobacco Use   Smoking status: Former    Types: Pipe    Start date: 02/19/1977   Smokeless tobacco: Never   Tobacco comments:    >25 years ago, pipe only.  Vaping Use   Vaping Use: Never used  Substance and Sexual Activity   Alcohol use: Yes    Comment: Maybe 1/2 drink a week- seldom    Drug use: No   Sexual activity: Yes    Comment: lives with wife, retired from education, [place], no major dietary restrictions  Other Topics Concern   Not on file  Social History Narrative   Retired Pharmacist, community.    Lives with wife   No major dietary  restrictions   Social Determinants of Health   Financial Resource Strain: Not on file  Food Insecurity: Not on file  Transportation Needs: Not on file  Physical Activity: Not on file  Stress: Not on file  Social Connections: Not on file  Intimate Partner Violence: Not on file    Outpatient Medications Prior to Visit  Medication Sig Dispense Refill   Chlorpheniramine Maleate (ALLERGY PO) Take by mouth. [employer] brand allergy med PRN only     Cholecalciferol (VITAMIN D3) 2000 UNITS capsule Take 2,000 Units by mouth daily.     COVID-19 mRNA Vac-TriS, Pfizer, (PFIZER-BIONT COVID-19 VAC-TRIS) SUSP injection Inject into the muscle. 0.3 mL 0   Ferrous Sulfate (IRON) 90 (18 Fe) MG TABS Take 1 capsule by mouth.     fluticasone (FLONASE) 50 MCG/ACT nasal spray Place 2 sprays into both nostrils as needed. 16 g 3   ZINC-VITAMIN C PO Take by mouth.     No facility-administered medications prior to visit.    No Known Allergies  Review of Systems  Constitutional:  Negative for chills, fever and malaise/fatigue.  HENT:  Negative for congestion and hearing loss.   Eyes:  Negative for discharge.  Respiratory:  Negative for cough, sputum production and shortness of breath.   Cardiovascular:  Negative for chest pain, palpitations and leg swelling.  Gastrointestinal:  Negative for abdominal pain, blood in stool, constipation, diarrhea, heartburn, nausea and vomiting.  Genitourinary:  Negative for dysuria, frequency, hematuria and urgency.  Musculoskeletal:  Negative for back pain, falls and myalgias.  Skin:  Negative for rash.  Neurological:  Negative for dizziness, sensory change, loss of consciousness, weakness and headaches.  Endo/Heme/Allergies:  Negative for environmental allergies. Does not bruise/bleed easily.  Psychiatric/Behavioral:  Negative for depression and suicidal ideas. The patient is not nervous/anxious and does not have insomnia.       Objective:    Physical  Exam Constitutional:      General: He is not in acute distress.    Appearance: Normal appearance. He is not ill-appearing or toxic-appearing.  HENT:     Head: Normocephalic and atraumatic.     Right Ear: External ear normal.     Left Ear: External ear normal.     Nose: Nose normal.  Eyes:     General:        Right eye: No discharge.        Left eye: No discharge.  Cardiovascular:     Rate and Rhythm: Normal rate.     Heart sounds: Normal heart sounds. No murmur heard. Pulmonary:     Effort: Pulmonary effort is normal.     Breath sounds: No wheezing or rhonchi.  Abdominal:     General: Bowel sounds are normal.     Palpations: Abdomen is soft. There is no mass.     Tenderness: There is no guarding.  Musculoskeletal:     Cervical back: Normal range of motion and neck supple.     Right lower leg: No edema.     Left lower leg: No edema.  Lymphadenopathy:     Cervical: No cervical adenopathy.  Skin:    Findings: No rash.  Neurological:     Mental Status: He is alert and oriented to person, place, and time.  Psychiatric:        Behavior: Behavior normal.    BP 108/64   Pulse 61   Temp 97.6 F (36.4 C)   Resp 16   Ht 5\' 7"  (1.702 m)   Wt 160 lb 3.2 oz (72.7 kg)   SpO2 98%   BMI 25.09 kg/m  Wt Readings from Last 3 Encounters:  01/27/21 160 lb 3.2 oz (72.7 kg)  07/11/20 156 lb (70.8 kg)  06/16/20 162 lb 9.6 oz (73.8 kg)    Diabetic Foot Exam - Simple   No data filed    Lab Results  Component Value Date   WBC 7.5 01/27/2021   HGB 14.7 01/27/2021   HCT 43.5 01/27/2021   PLT 230.0 01/27/2021   GLUCOSE 84 01/27/2021   CHOL 158 01/27/2021   TRIG 109.0 01/27/2021   HDL 59.80 01/27/2021   LDLCALC 77 01/27/2021   ALT 19 01/27/2021   AST 22 01/27/2021   NA 139 01/27/2021   K 4.2 01/27/2021   CL 104 01/27/2021   CREATININE 0.75 01/27/2021   BUN 15 01/27/2021   CO2 27 01/27/2021   TSH 0.79 01/27/2021   PSA 0.87 01/27/2021   HGBA1C 6.4 01/27/2021    Lab  Results  Component Value Date  TSH 0.79 01/27/2021   Lab Results  Component Value Date   WBC 7.5 01/27/2021   HGB 14.7 01/27/2021   HCT 43.5 01/27/2021   MCV 91.6 01/27/2021   PLT 230.0 01/27/2021   Lab Results  Component Value Date   NA 139 01/27/2021   K 4.2 01/27/2021   CO2 27 01/27/2021   GLUCOSE 84 01/27/2021   BUN 15 01/27/2021   CREATININE 0.75 01/27/2021   BILITOT 0.6 01/27/2021   ALKPHOS 66 01/27/2021   AST 22 01/27/2021   ALT 19 01/27/2021   PROT 6.9 01/27/2021   ALBUMIN 4.3 01/27/2021   CALCIUM 9.8 01/27/2021   ANIONGAP 6 01/31/2016   GFR 85.96 01/27/2021   Lab Results  Component Value Date   CHOL 158 01/27/2021   Lab Results  Component Value Date   HDL 59.80 01/27/2021   Lab Results  Component Value Date   LDLCALC 77 01/27/2021   Lab Results  Component Value Date   TRIG 109.0 01/27/2021   Lab Results  Component Value Date   CHOLHDL 3 01/27/2021   Lab Results  Component Value Date   HGBA1C 6.4 01/27/2021       Assessment & Plan:   Problem List Items Addressed This Visit     Anemia   Relevant Orders   CBC with Differential/Platelet (Completed)   Comprehensive metabolic panel (Completed)   Preventative health care - Primary    Patient encouraged to maintain heart healthy diet, regular exercise, adequate sleep. Consider daily probiotics. Take medications as prescribed. Labs ordered and reviewed. Had colonoscopy April 2022, has aged out of screening colonoscopy      Relevant Orders   Hemoglobin A1c (Completed)   CBC with Differential/Platelet (Completed)   Comprehensive metabolic panel (Completed)   PSA (Completed)   Lipid panel (Completed)   TSH (Completed)   Hepatitis C antibody   Other Visit Diagnoses     Screening for hyperlipidemia       Relevant Orders   CBC with Differential/Platelet (Completed)   Comprehensive metabolic panel (Completed)   Lipid panel (Completed)   Screening for prostate cancer       Relevant Orders    PSA (Completed)   Encounter for hepatitis C screening test for low risk patient       Relevant Orders   Hepatitis C antibody       I am having Bradley Sites "Jim" maintain his Vitamin D3, Iron, fluticasone, ZINC-VITAMIN C PO, Chlorpheniramine Maleate (ALLERGY PO), and Pfizer-Bio[employer] COVID-19 Vac-TriS.  No orders of the defined types were placed in this encounter.    Penni Homans, MD

## 2021-02-27 ENCOUNTER — Encounter: Payer: Self-pay | Admitting: Family Medicine

## 2021-02-27 DIAGNOSIS — M170 Bilateral primary osteoarthritis of knee: Secondary | ICD-10-CM

## 2021-03-07 ENCOUNTER — Ambulatory Visit: Payer: Self-pay

## 2021-03-07 ENCOUNTER — Ambulatory Visit: Payer: Medicare HMO | Admitting: Family Medicine

## 2021-03-07 ENCOUNTER — Encounter: Payer: Self-pay | Admitting: Family Medicine

## 2021-03-07 VITALS — BP 112/82 | Ht 67.0 in | Wt 160.0 lb

## 2021-03-07 DIAGNOSIS — M170 Bilateral primary osteoarthritis of knee: Secondary | ICD-10-CM

## 2021-03-07 NOTE — Patient Instructions (Signed)
Nice to meet you Please use ice as needed  Please try the insoles  Please try voltaren  Please try the exercises   Please send me a message in MyChart with any questions or updates.  Please see me back in 4 weeks or as needed if better.   --Dr. Raeford Razor

## 2021-03-07 NOTE — Progress Notes (Signed)
°  Bradley Peterson - 81 y.o. male MRN [id number]  Date of birth: February 13, 1941  SUBJECTIVE:  Including CC & ROS.  No chief complaint on file.   Bradley Peterson is a 81 y.o. male that is presenting with acute on chronic bilateral knee pain.  The pain is occurring over the medial compartment.  It started around Christmas time.  He does walk on a regular basis.  Denies any history of surgery.  Independent review of the right knee x-ray from 2019 shows no acute changes.   Review of Systems See HPI   HISTORY: Past Medical, Surgical, Social, and Family History Reviewed & Updated per EMR.   Pertinent Historical Findings include:  Past Medical History:  Diagnosis Date   Allergic state 12/27/2013   Allergy    Arthritis of neck    Heart murmur    Positive TB test     Past Surgical History:  Procedure Laterality Date   COLONOSCOPY  01/2011   DENTAL SURGERY     TONSILLECTOMY     Late 1940s   UPPER GASTROINTESTINAL ENDOSCOPY  01/2011     PHYSICAL EXAM:  VS: BP 112/82 (BP Location: Right Arm, Patient Position: Sitting)    Ht 5\' 7"  (1.702 m)    Wt 160 lb (72.6 kg)    BMI 25.06 kg/m  Physical Exam Gen: NAD, alert, cooperative with exam, well-appearing MSK:  Neurovascularly intact    Limited ultrasound: Right and left knee:  Left knee: Mild effusion. Normal-appearing quadricep and patellar tendon. Out outpouching of the medial meniscus. Mild degenerative changes of the lateral meniscus.  Right knee: No effusion suprapatellar pouch. Normal-appearing quadricep and patellar tendon. Normal-appearing medial joint space. 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  Has more degenerative changes appreciated on ultrasound. -Counseled on home exercise therapy and supportive care. -Green sport  insoles. -Could consider custom orthotics with physical therapy.

## 2021-03-07 NOTE — Assessment & Plan Note (Signed)
Acute on chronic in nature.  Has more degenerative changes appreciated on ultrasound. -Counseled on home exercise therapy and supportive care. -Green sport insoles. -Could consider custom orthotics with physical therapy.

## 2021-04-06 ENCOUNTER — Ambulatory Visit (INDEPENDENT_AMBULATORY_CARE_PROVIDER_SITE_OTHER): Payer: Medicare HMO

## 2021-04-06 VITALS — Ht 67.0 in | Wt 160.0 lb

## 2021-04-06 DIAGNOSIS — Z0000 Encounter for general adult medical examination without abnormal findings: Secondary | ICD-10-CM | POA: Diagnosis not present

## 2021-04-06 NOTE — Patient Instructions (Signed)
Bradley Peterson , Thank you for taking time to complete your Medicare Wellness Visit. I appreciate your ongoing commitment to your health goals. Please review the following plan we discussed and let me know if I can assist you in the future.   Screening recommendations/referrals: Colonoscopy: No longer required Recommended yearly ophthalmology/optometry visit for glaucoma screening and checkup Recommended yearly dental visit for hygiene and checkup  Vaccinations: Influenza vaccine: Up to date Pneumococcal vaccine: Up to date Tdap vaccine: Up to date Shingles vaccine: Completed vaccines   Covid-19: Booster available at the pharmacy  Advanced directives: Copy in chart  Conditions/risks identified: See problem list  Next appointment: Follow up in one year for your annual wellness visit. 04/09/2022 @ 8:20 (phone visit)  Preventive Care 80 Years and Older, Male Preventive care refers to lifestyle choices and visits with your health care provider that can promote health and wellness. What does preventive care include? A yearly physical exam. This is also called an annual well check. Dental exams once or twice a year. Routine eye exams. Ask your health care provider how often you should have your eyes checked. Personal lifestyle choices, including: Daily care of your teeth and gums. Regular physical activity. Eating a healthy diet. Avoiding tobacco and drug use. Limiting alcohol use. Practicing safe sex. Taking low doses of aspirin every day. Taking vitamin and mineral supplements as recommended by your health care provider. What happens during an annual well check? The services and screenings done by your health care provider during your annual well check will depend on your age, overall health, lifestyle risk factors, and family history of disease. Counseling  Your health care provider may ask you questions about your: Alcohol use. Tobacco use. Drug use. Emotional well-being. Home  and relationship well-being. Sexual activity. Eating habits. History of falls. Memory and ability to understand (cognition). Work and work Statistician. Screening  You may have the following tests or measurements: Height, weight, and BMI. Blood pressure. Lipid and cholesterol levels. These may be checked every 5 years, or more frequently if you are over 13 years old. Skin check. Lung cancer screening. You may have this screening every year starting at age 80 if you have a 30-pack-year history of smoking and currently smoke or have quit within the past 15 years. Fecal occult blood test (FOBT) of the stool. You may have this test every year starting at age 80. Flexible sigmoidoscopy or colonoscopy. You may have a sigmoidoscopy every 5 years or a colonoscopy every 10 years starting at age 80. Prostate cancer screening. Recommendations will vary depending on your family history and other risks. Hepatitis C blood test. Hepatitis B blood test. Sexually transmitted disease (STD) testing. Diabetes screening. This is done by checking your blood sugar (glucose) after you have not eaten for a while (fasting). You may have this done every 1-3 years. Abdominal aortic aneurysm (AAA) screening. You may need this if you are a current or former smoker. Osteoporosis. You may be screened starting at age 80 if you are at high risk. Talk with your health care provider about your test results, treatment options, and if necessary, the need for more tests. Vaccines  Your health care provider may recommend certain vaccines, such as: Influenza vaccine. This is recommended every year. Tetanus, diphtheria, and acellular pertussis (Tdap, Td) vaccine. You may need a Td booster every 10 years. Zoster vaccine. You may need this after age 80. Pneumococcal 13-valent conjugate (PCV13) vaccine. One dose is recommended after age 80. Pneumococcal polysaccharide (PPSV23)  vaccine. One dose is recommended after age 80. Talk to  your health care provider about which screenings and vaccines you need and how often you need them. This information is not intended to replace advice given to you by your health care provider. Make sure you discuss any questions you have with your health care provider. Document Released: 03/04/2015 Document Revised: 10/26/2015 Document Reviewed: 12/07/2014 Elsevier Interactive Patient Education  2017 [place] Prevention in the Home Falls can cause injuries. They can happen to people of all ages. There are many things you can do to make your home safe and to help prevent falls. What can I do on the outside of my home? Regularly fix the edges of walkways and driveways and fix any cracks. Remove anything that might make you trip as you walk through a door, such as a raised step or threshold. Trim any bushes or trees on the path to your home. Use bright outdoor lighting. Clear any walking paths of anything that might make someone trip, such as rocks or tools. Regularly check to see if handrails are loose or broken. Make sure that both sides of any steps have handrails. Any raised decks and porches should have guardrails on the edges. Have any leaves, snow, or ice cleared regularly. Use sand or salt on walking paths during winter. Clean up any spills in your garage right away. This includes oil or grease spills. What can I do in the bathroom? Use night lights. Install grab bars by the toilet and in the tub and shower. Do not use towel bars as grab bars. Use non-skid mats or decals in the tub or shower. If you need to sit down in the shower, use a plastic, non-slip stool. Keep the floor dry. Clean up any water that spills on the floor as soon as it happens. Remove soap buildup in the tub or shower regularly. Attach bath mats securely with double-sided non-slip rug tape. Do not have throw rugs and other things on the floor that can make you trip. What can I do in the bedroom? Use  night lights. Make sure that you have a light by your bed that is easy to reach. Do not use any sheets or blankets that are too big for your bed. They should not hang down onto the floor. Have a firm chair that has side arms. You can use this for support while you get dressed. Do not have throw rugs and other things on the floor that can make you trip. What can I do in the kitchen? Clean up any spills right away. Avoid walking on wet floors. Keep items that you use a lot in easy-to-reach places. If you need to reach something above you, use a strong step stool that has a grab bar. Keep electrical cords out of the way. Do not use floor polish or wax that makes floors slippery. If you must use wax, use non-skid floor wax. Do not have throw rugs and other things on the floor that can make you trip. What can I do with my stairs? Do not leave any items on the stairs. Make sure that there are handrails on both sides of the stairs and use them. Fix handrails that are broken or loose. Make sure that handrails are as long as the stairways. Check any carpeting to make sure that it is firmly attached to the stairs. Fix any carpet that is loose or worn. Avoid having throw rugs at the top or bottom  of the stairs. If you do have throw rugs, attach them to the floor with carpet tape. Make sure that you have a light switch at the top of the stairs and the bottom of the stairs. If you do not have them, ask someone to add them for you. What else can I do to help prevent falls? Wear shoes that: Do not have high heels. Have rubber bottoms. Are comfortable and fit you well. Are closed at the toe. Do not wear sandals. If you use a stepladder: Make sure that it is fully opened. Do not climb a closed stepladder. Make sure that both sides of the stepladder are locked into place. Ask someone to hold it for you, if possible. Clearly mark and make sure that you can see: Any grab bars or handrails. First and last  steps. Where the edge of each step is. Use tools that help you move around (mobility aids) if they are needed. These include: Canes. Walkers. Scooters. Crutches. Turn on the lights when you go into a dark area. Replace any light bulbs as soon as they burn out. Set up your furniture so you have a clear path. Avoid moving your furniture around. If any of your floors are uneven, fix them. If there are any pets around you, be aware of where they are. Review your medicines with your doctor. Some medicines can make you feel dizzy. This can increase your chance of falling. Ask your doctor what other things that you can do to help prevent falls. This information is not intended to replace advice given to you by your health care provider. Make sure you discuss any questions you have with your health care provider. Document Released: 12/02/2008 Document Revised: 07/14/2015 Document Reviewed: 03/12/2014 Elsevier Interactive Patient Education  2017 [employer].

## 2021-04-06 NOTE — Progress Notes (Signed)
Subjective:   Bradley Peterson is a 81 y.o. male who presents for Medicare Annual/Subsequent preventive examination.  I connected with Dorn today by telephone and verified that I am speaking with the correct person using two identifiers. Location patient: home Location provider: work Persons participating in the virtual visit: patient, Marine scientist.    I discussed the limitations, risks, security and privacy concerns of performing an evaluation and management service by telephone and the availability of in person appointments. I also discussed with the patient that there may be a patient responsible charge related to this service. The patient expressed understanding and verbally consented to this telephonic visit.    Interactive audio and video telecommunications were attempted between this provider and patient, however failed, due to patient having technical difficulties OR patient did not have access to video capability.  We continued and completed visit with audio only.  Some vital signs may be absent or patient reported.   Time Spent with patient on telephone encounter: 20 minutes   Review of Systems     Cardiac Risk Factors include: advanced age (>67men, >78 women);male gender     Objective:    Today's Vitals   04/06/21 0822  Weight: 160 lb (72.6 kg)  Height: 5\' 7"  (1.702 m)   Body mass index is 25.06 kg/m.  Advanced Directives 04/06/2021 04/01/2020 01/03/2017 01/31/2016 12/28/2014  Does Patient Have a Medical Advance Directive? Yes Yes Yes Yes Yes  Type of Paramedic of [place];Living will Healthcare Power of [place];Living will Living will [place];Living will  Does patient want to make changes to medical advance directive? - - - - No - Patient declined  Copy of [place] in Chart? Yes - validated most recent copy scanned in chart (See row information) Yes - validated most recent copy  scanned in chart (See row information) No - copy requested - No - copy requested    Current Medications (verified) Outpatient Encounter Medications as of 04/06/2021  Medication Sig   Chlorpheniramine Maleate (ALLERGY PO) Take by mouth. [employer] brand allergy med PRN only   Cholecalciferol (VITAMIN D3) 2000 UNITS capsule Take 2,000 Units by mouth daily.   Ferrous Sulfate (IRON) 90 (18 Fe) MG TABS Take 1 capsule by mouth.   fluticasone (FLONASE) 50 MCG/ACT nasal spray Place 2 sprays into both nostrils as needed.   ZINC-VITAMIN C PO Take by mouth.   COVID-19 mRNA Vac-TriS, Pfizer, (PFIZER-BIONT COVID-19 VAC-TRIS) SUSP injection Inject into the muscle. (Patient not taking: Reported on 04/06/2021)   No facility-administered encounter medications on file as of 04/06/2021.    Allergies (verified) Patient has no known allergies.   History: Past Medical History:  Diagnosis Date   Allergic state 12/27/2013   Allergy    Arthritis of neck    Heart murmur    Positive TB test    Past Surgical History:  Procedure Laterality Date   COLONOSCOPY  01/2011   DENTAL SURGERY     TONSILLECTOMY     Late 1940s   UPPER GASTROINTESTINAL ENDOSCOPY  01/2011   Family History  Problem Relation Age of Onset   Heart disease Father        CABG at 18, died age 45   Arthritis Father    Arthritis Mother        Living 43   Hypertension Mother    Hyperlipidemia Mother    Stroke Mother    Colon cancer Neg Hx  Colon polyps Neg Hx    Esophageal cancer Neg Hx    Rectal cancer Neg Hx    Stomach cancer Neg Hx    Social History   Socioeconomic History   Marital status: Married    Spouse name: Not on file   Number of children: 3   Years of education: Not on file   Highest education level: Not on file  Occupational History   Not on file  Tobacco Use   Smoking status: Former    Types: Pipe    Start date: 02/19/1977   Smokeless tobacco: Never   Tobacco comments:    >25 years ago, pipe only.  Vaping Use    Vaping Use: Never used  Substance and Sexual Activity   Alcohol use: Yes    Comment: Maybe 1/2 drink a week- seldom    Drug use: No   Sexual activity: Yes    Comment: lives with wife, retired from education, [place], no major dietary restrictions  Other Topics Concern   Not on file  Social History Narrative   Retired Pharmacist, community.    Lives with wife   No major dietary restrictions   Social Determinants of Health   Financial Resource Strain: Low Risk    Difficulty of Paying Living Expenses: Not hard at all  Food Insecurity: No Food Insecurity   Worried About Charity fundraiser in the Last Year: Never true   Arboriculturist in the Last Year: Never true  Transportation Needs: No Transportation Needs   Lack of Transportation (Medical): No   Lack of Transportation (Non-Medical): No  Physical Activity: Sufficiently Active   Days of Exercise per Week: 7 days   Minutes of Exercise per Session: 60 min  Stress: No Stress Concern Present   Feeling of Stress : Not at all  Social Connections: Socially Integrated   Frequency of Communication with Friends and Family: More than three times a week   Frequency of Social Gatherings with Friends and Family: More than three times a week   Attends Religious Services: More than 4 times per year   Active Member of [employer] or Organizations: Yes   Attends Music therapist: More than 4 times per year   Marital Status: Married    Tobacco Counseling Counseling given: Not Answered Tobacco comments: >25 years ago, pipe only.   Clinical Intake:  Pre-visit preparation completed: Yes  Pain : No/denies pain     BMI - recorded: 25.09 Nutritional Status: BMI 25 -29 Overweight Nutritional Risks: None Diabetes: No  How often do you need to have someone help you when you read instructions, pamphlets, or other written materials from your doctor or pharmacy?: 1 - Never  Diabetic?No  Interpreter Needed?:  No  Information entered by :: Caroleen Hamman LPN   Activities of Daily Living In your present state of health, do you have any difficulty performing the following activities: 04/06/2021 07/11/2020  Hearing? N N  Vision? N N  Difficulty concentrating or making decisions? N N  Walking or climbing stairs? N N  Dressing or bathing? N N  Doing errands, shopping? N N  Preparing Food and eating ? N -  Using the Toilet? N -  In the past six months, have you accidently leaked urine? N -  Do you have problems with loss of bowel control? N -  Managing your Medications? N -  Managing your Finances? N -  Housekeeping or managing your Housekeeping? N -  Some recent data might be hidden    Patient Care Team: Mosie Lukes, MD as PCP - General (Family Medicine)  Indicate any recent Medical Services you may have received from other than [facility] providers in the past year (date may be approximate).     Assessment:   This is a routine wellness examination for Prospero.  Hearing/Vision screen Hearing Screening - Comments:: No issues Vision Screening - Comments:: Last eye exam-11/2020-[facility]  Dietary issues and exercise activities discussed: Current Exercise Habits: Home exercise routine, Type of exercise: walking, Time (Minutes): 60, Frequency (Times/Week): 7, Weekly Exercise (Minutes/Week): 420, Intensity: Mild, Exercise limited by: None identified   Goals Addressed             This Visit's Progress    Drink at least 4 glasses of water per day.   On track      Depression Screen PHQ 2/9 Scores 04/06/2021 07/11/2020 01/11/2020 01/03/2017 01/03/2017 12/30/2015 12/28/2014  PHQ - 2 Score 0 0 0 0 0 0 0    Fall Risk Fall Risk  04/06/2021 07/11/2020 01/11/2020 01/03/2017 01/03/2017  Falls in the past year? 0 0 0 No No  Number falls in past yr: 0 0 0 - -  Injury with Fall? 0 0 0 - -  Follow up Falls prevention discussed - - - -    FALL RISK PREVENTION PERTAINING TO THE HOME:  Any  stairs in or around the home? Yes  If so, are there any without handrails? No  Home free of loose throw rugs in walkways, pet beds, electrical cords, etc? Yes  Adequate lighting in your home to reduce risk of falls? Yes   ASSISTIVE DEVICES UTILIZED TO PREVENT FALLS:  Life alert? No  Use of a cane, walker or w/c? No  Grab bars in the bathroom? Yes  Shower chair or bench in shower? No  Elevated toilet seat or a handicapped toilet? No   TIMED UP AND GO:  Was the test performed? No . Phone visit   Cognitive Function:Normal cognitive status assessed by this Nurse Health Advisor. No abnormalities found.   MMSE - Mini Mental State Exam 01/03/2017  Orientation to time 5  Orientation to Place 5  Registration 3  Attention/ Calculation 5  Recall 3  Language- name 2 objects 2  Language- repeat 1  Language- follow 3 step command 3  Language- read & follow direction 1  Write a sentence 1  Copy design 1  Total score 30        Immunizations Immunization History  Administered Date(s) Administered   Influenza, High Dose Seasonal PF 12/23/2012, 12/30/2015, 12/26/2016, 01/06/2018, 11/03/2019   Influenza,inj,Quad PF,6+ Mos 12/24/2013, 12/28/2014   Influenza-Unspecified 11/20/2018, 12/09/2020   PFIZER Comirnaty(Gray Top)Covid-19 Tri-Sucrose Vaccine 09/19/2020   PFIZER(Purple Top)SARS-COV-2 Vaccination 04/04/2019, 04/27/2019, 01/11/2020   Pneumococcal Conjugate-13 12/24/2013   Pneumococcal Polysaccharide-23 12/30/2015   Tdap 12/28/2014   Zoster Recombinat (Shingrix) 08/02/2017, 10/04/2017   Zoster, Live 02/20/2012    TDAP status: Up to date  Flu Vaccine status: Up to date  Pneumococcal vaccine status: Up to date  Covid-19 vaccine status: Information provided on how to obtain vaccines.   Qualifies for Shingles Vaccine? No   Zostavax completed Yes   Shingrix Completed?: Yes  Screening Tests Health Maintenance  Topic Date Due   COVID-19 Vaccine (5 - Booster for Pfizer series)  11/14/2020   TETANUS/TDAP  12/27/2024   Pneumonia Vaccine 42+ Years old  Completed   INFLUENZA VACCINE  Completed  Hepatitis C Screening  Completed   Zoster Vaccines- Shingrix  Completed   HPV VACCINES  Aged Out    Health Maintenance  Health Maintenance Due  Topic Date Due   COVID-19 Vaccine (5 - Booster for Pfizer series) 11/14/2020    Colorectal cancer screening: No longer required.   Lung Cancer Screening: (Low Dose CT Chest recommended if Age 57-80 years, 30 pack-year currently smoking OR have quit w/in 15years.) does not qualify.     Additional Screening:  Hepatitis C Screening:Completed 01/27/2021  Vision Screening: Recommended annual ophthalmology exams for early detection of glaucoma and other disorders of the eye. Is the patient up to date with their annual eye exam?  Yes  Who is the provider or what is the name of the office in which the patient attends annual eye exams? [facility]   Dental Screening: Recommended annual dental exams for proper oral hygiene  Community Resource Referral / Chronic Care Management: CRR required this visit?  No   CCM required this visit?  No      Plan:     I have personally reviewed and noted the following in the patient's chart:   Medical and social history Use of alcohol, tobacco or illicit drugs  Current medications and supplements including opioid prescriptions. Patient is not currently taking opioid prescriptions. Functional ability and status Nutritional status Physical activity Advanced directives List of other physicians Hospitalizations, surgeries, and ER visits in previous 12 months Vitals Screenings to include cognitive, depression, and falls Referrals and appointments  In addition, I have reviewed and discussed with patient certain preventive protocols, quality metrics, and best practice recommendations. A written personalized care plan for preventive services as well as general preventive health  recommendations were provided to patient.   Due to this being a telephonic visit, the after visit summary with patients personalized plan was offered to patient via mail or my-chart. Patient would like to access on my-chart.   Marta Antu, LPN   7/71/1657  Nurse Health Advisor  Nurse Notes: None

## 2021-08-01 ENCOUNTER — Ambulatory Visit: Payer: Medicare HMO | Admitting: Family Medicine

## 2021-08-03 ENCOUNTER — Ambulatory Visit (INDEPENDENT_AMBULATORY_CARE_PROVIDER_SITE_OTHER): Payer: Medicare HMO | Admitting: Family Medicine

## 2021-08-03 ENCOUNTER — Ambulatory Visit: Payer: Medicare HMO | Admitting: Family Medicine

## 2021-08-03 ENCOUNTER — Encounter: Payer: Self-pay | Admitting: Family Medicine

## 2021-08-03 VITALS — BP 116/70 | HR 62 | Ht 67.0 in | Wt 157.2 lb

## 2021-08-03 DIAGNOSIS — R42 Dizziness and giddiness: Secondary | ICD-10-CM | POA: Diagnosis not present

## 2021-08-03 DIAGNOSIS — D649 Anemia, unspecified: Secondary | ICD-10-CM | POA: Diagnosis not present

## 2021-08-03 DIAGNOSIS — R739 Hyperglycemia, unspecified: Secondary | ICD-10-CM

## 2021-08-03 LAB — COMPREHENSIVE METABOLIC PANEL
ALT: 20 U/L (ref 0–53)
AST: 21 U/L (ref 0–37)
Albumin: 4.4 g/dL (ref 3.5–5.2)
Alkaline Phosphatase: 62 U/L (ref 39–117)
BUN: 15 mg/dL (ref 6–23)
CO2: 29 mEq/L (ref 19–32)
Calcium: 9.8 mg/dL (ref 8.4–10.5)
Chloride: 104 mEq/L (ref 96–112)
Creatinine, Ser: 0.75 mg/dL (ref 0.40–1.50)
GFR: 85.65 mL/min (ref >60.00)
Glucose, Bld: 91 mg/dL (ref 70–99)
Potassium: 4.5 mEq/L (ref 3.5–5.1)
Sodium: 140 mEq/L (ref 135–145)
Total Bilirubin: 0.6 mg/dL (ref 0.2–1.2)
Total Protein: 7 g/dL (ref 6.0–8.3)

## 2021-08-03 LAB — CBC
HCT: 42.5 % (ref 39.0–52.0)
Hemoglobin: 14.3 g/dL (ref 13.0–17.0)
MCHC: 33.5 g/dL (ref 30.0–36.0)
MCV: 93.3 fl (ref 78.0–100.0)
Platelets: 240 10*3/uL (ref 150.0–400.0)
RBC: 4.56 Mil/uL (ref 4.22–5.81)
RDW: 14 % (ref 11.5–15.5)
WBC: 7.3 10*3/uL (ref 4.0–10.5)

## 2021-08-03 LAB — IBC PANEL
Iron: 115 ug/dL (ref 42–165)
Saturation Ratios: 26.8 % (ref 20.0–50.0)
TIBC: 429.8 ug/dL (ref 250.0–450.0)
Transferrin: 307 mg/dL (ref 212.0–360.0)

## 2021-08-03 LAB — HEMOGLOBIN A1C: Hgb A1c MFr Bld: 5.7 % (ref 4.6–6.5)

## 2021-08-03 NOTE — Patient Instructions (Signed)
Nice to meet you today! Glad you are doing so well overall. Updating a few labs today. Everything else was stable in December so we can recheck at your next physical.  Make sure you are staying well hydrated, especially before going to work outside in the heat.

## 2021-08-03 NOTE — Progress Notes (Signed)
Possible sinus/allergies Gait disturbances around 1-2 times a week

## 2021-08-03 NOTE — Progress Notes (Signed)
Established Patient Office Visit  Subjective   Patient ID: Bradley Peterson, male    DOB: [birth year]  Age: 81 y.o. MRN: [id number]  CC: routine f/u    HPI Patient is here for his 56-monthroutine follow-up. He last saw PCP For CPE in December 2022. Labs were stable at that time.   History of anemia and iron deficiency. He is currently on daily iron supplementation. No new symptoms. He likes to donate blood on a regular basis.   He does want to mention that he has had 2 brief episodes of dizziness over the past several weeks.  States that it reminded him of vertigo however he did not have any spinning.  States he just felt some dizziness with quick position changes and a few seconds of blurred vision.  Reports that the most recent episode was yesterday when he was outside working.  Admits that sometimes he allows himself to get dehydrated because he does not drink enough water.  States he was working outside doing a lot of moving and bending and he had about a minute or so when he stood up quickly and felt the dizziness, but it resolved on its own and has not recurred. Patient denies any chest pain, palpitations, dyspnea, wheezing, edema, recurrent headaches, vision changes.       ROS All review of systems negative except what is listed in the HPI    Objective:     BP 116/70   Pulse 62   Ht '5\' 7"'$  (1.702 m)   Wt 157 lb 3.2 oz (71.3 kg)   SpO2 98%   BMI 24.62 kg/m    Physical Exam Vitals reviewed.  Constitutional:      Appearance: Normal appearance. He is normal weight.  Cardiovascular:     Rate and Rhythm: Normal rate and regular rhythm.  Pulmonary:     Effort: Pulmonary effort is normal.     Breath sounds: Normal breath sounds.  Musculoskeletal:        General: Normal range of motion.     Right lower leg: No edema.     Left lower leg: No edema.  Skin:    General: Skin is warm and dry.  Neurological:     General: No focal deficit present.     Mental Status: He is  alert and oriented to person, place, and time. Mental status is at baseline.  Psychiatric:        Mood and Affect: Mood normal.        Behavior: Behavior normal.        Thought Content: Thought content normal.        Judgment: Judgment normal.     Lying - 128/87 HR 65 Sitting - 117/81 HR 68 Standing - 123/80 HR 72  No results found for any visits on 08/03/21.    The 10-year ASCVD risk score (Arnett DK, et al., 2019) is: 24.7%    Assessment & Plan:   1. Anemia, unspecified type Previously stable. Asymptomatic. Labs today.  - CBC - IBC panel  2. Hyperglycemia Last A1c 6.4%. Rechecking labs today. Asymptomatic. Working on hard on healthy diet and staying active.  - Comprehensive metabolic panel - Hemoglobin A1c  3.  Episode of dizziness Both instances describe sound like possible orthostatic/dehydration causes.  No alarm findings on exam today and orthostatics negative.  Recommend making sure you are staying well-hydrated especially before going outside to work in the heat.  Continue to monitor for any new symptoms and  let us know of any changes.     Return in about 6 months (around 02/02/2022) for CPE w PCP.    Terrilyn Saver, NP

## 2021-11-30 ENCOUNTER — Encounter: Payer: Self-pay | Admitting: Family

## 2021-11-30 ENCOUNTER — Ambulatory Visit (HOSPITAL_BASED_OUTPATIENT_CLINIC_OR_DEPARTMENT_OTHER)
Admission: RE | Admit: 2021-11-30 | Discharge: 2021-11-30 | Disposition: A | Discharge status: Home / Self Care | Payer: Medicare HMO | Source: Ambulatory Visit | Attending: Family | Admitting: Family

## 2021-11-30 ENCOUNTER — Ambulatory Visit (INDEPENDENT_AMBULATORY_CARE_PROVIDER_SITE_OTHER): Payer: Medicare HMO | Admitting: Family

## 2021-11-30 VITALS — BP 128/70 | HR 66 | Temp 98.3°F | Ht 68.0 in | Wt 163.4 lb

## 2021-11-30 DIAGNOSIS — R0781 Pleurodynia: Secondary | ICD-10-CM | POA: Diagnosis present

## 2021-11-30 NOTE — Progress Notes (Signed)
Bradley Peterson is a 81 y.o. 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 [employer] brand allergy med PRN only     Cholecalciferol (VITAMIN D3) 2000 UNITS capsule Take 2,000 Units by mouth daily.     Ferrous Sulfate (IRON) 90 (18 Fe) MG TABS Take 1 capsule by mouth.     fluticasone (FLONASE) 50 MCG/ACT nasal spray Place 2 sprays into both nostrils as needed. 16 g 3   ZINC-VITAMIN C PO Take by mouth.     No current facility-administered medications for this visit.    Allergies: Patient has no known allergies.  Past Medical History:  Diagnosis Date   Allergic state 12/27/2013   Allergy    Arthritis of neck    Heart murmur    Positive TB test     Past Surgical History:  Procedure Laterality Date   COLONOSCOPY  01/2011   DENTAL SURGERY     TONSILLECTOMY     Late 1940s   UPPER GASTROINTESTINAL ENDOSCOPY  01/2011    Family History  Problem Relation Age of Onset   Heart disease Father        CABG at 81, died age 9   Arthritis Father    Arthritis Mother        Living 25   Hypertension Mother    Hyperlipidemia Mother    Stroke Mother    Colon cancer Neg Hx    Colon polyps Neg Hx    Esophageal cancer Neg Hx    Rectal cancer Neg Hx    Stomach cancer Neg Hx     Social History   Tobacco Use   Smoking status: Former    Types: Pipe     Start date: 02/19/1977   Smokeless tobacco: Never   Tobacco comments:    >25 years ago, pipe only.  Substance Use Topics   Alcohol use: Yes    Comment: Maybe 1/2 drink a week- seldom     Subjective:    Low back pain x 1 week; notes that he fell against the wall while playing with his grandchild; notes that symptoms are improving; is using extra Strength Tylenol and alternating heat and ice; does not want new medications- "want to make sure nothing is wrong on the inside."   Objective:  Vitals:   11/30/21 1328  BP: 128/70  Pulse: 66  Temp: 98.3 F (36.8 C)  TempSrc: Oral  SpO2: 98%  Weight: 163 lb 6.4 oz (74.1 kg)  Height: '5\' 8"'$  (1.727 m)    General: Well developed, well nourished, in no acute distress  Skin : Warm and dry.  Head: Normocephalic and atraumatic  Lungs: Respirations unlabored; clear to auscultation bilaterally without wheeze, rales, rhonchi  CVS exam: normal rate and regular rhythm.  Musculoskeletal: No deformities; no active joint inflammation  Extremities: No edema, cyanosis, clubbing  Vessels: Symmetric bilaterally  Neurologic: Alert  and oriented; speech intact; face symmetrical; moves all extremities well; CNII-XII intact without focal deficit   Assessment:  1. Rib pain on left side     Plan:  Low suspicion for fracture; suspect muscular; continue to apply ice; he will continue Tylenol as needed- patient defers prescriptive treatment;  will update X-ray; follow up to be determined based on results.   No follow-ups on file.  Orders Placed This Encounter  Procedures   DG Ribs Unilateral Left    Standing Status:   Future    Number of Occurrences:   1    Standing Expiration Date:   12/01/2022    Order Specific Question:   Reason for Exam (SYMPTOM  OR DIAGNOSIS REQUIRED)    Answer:   pain    Order Specific Question:   Preferred imaging location?    Answer:   [facility]    Requested Prescriptions    No prescriptions requested or ordered in  this encounter

## 2021-12-06 ENCOUNTER — Telehealth: Payer: Self-pay

## 2021-12-06 NOTE — Telephone Encounter (Signed)
Pt called saw laura but is a blyth pt, said he was told if he noticed blood in his stool to contact us. Spoke to the DOD per Dr.Wendling  Pt need appt if not have any headaches or dizziness and if start feel worsen to go ER.  Pt states feels fine appt made with Jodi Mourning ,Np tomorrow.

## 2021-12-07 ENCOUNTER — Ambulatory Visit (INDEPENDENT_AMBULATORY_CARE_PROVIDER_SITE_OTHER): Payer: Medicare HMO | Admitting: Family

## 2021-12-07 ENCOUNTER — Encounter: Payer: Self-pay | Admitting: Family

## 2021-12-07 VITALS — BP 110/70 | HR 79 | Temp 98.0°F | Ht 68.0 in | Wt 158.9 lb

## 2021-12-07 DIAGNOSIS — K649 Unspecified hemorrhoids: Secondary | ICD-10-CM | POA: Diagnosis not present

## 2021-12-07 LAB — CBC WITH DIFFERENTIAL/PLATELET
Basophils Absolute: 0 10*3/uL (ref 0.0–0.1)
Basophils Relative: 0.6 % (ref 0.0–3.0)
Eosinophils Absolute: 0.1 10*3/uL (ref 0.0–0.7)
Eosinophils Relative: 1.5 % (ref 0.0–5.0)
HCT: 43.5 % (ref 39.0–52.0)
Hemoglobin: 14.6 g/dL (ref 13.0–17.0)
Lymphocytes Relative: 21 % (ref 12.0–46.0)
Lymphs Abs: 1.6 10*3/uL (ref 0.7–4.0)
MCHC: 33.5 g/dL (ref 30.0–36.0)
MCV: 91.8 fl (ref 78.0–100.0)
Monocytes Absolute: 0.7 10*3/uL (ref 0.1–1.0)
Monocytes Relative: 8.6 % (ref 3.0–12.0)
Neutro Abs: 5.4 10*3/uL (ref 1.4–7.7)
Neutrophils Relative %: 68.3 % (ref 43.0–77.0)
Platelets: 262 10*3/uL (ref 150.0–400.0)
RBC: 4.74 Mil/uL (ref 4.22–5.81)
RDW: 14.1 % (ref 11.5–15.5)
WBC: 7.9 10*3/uL (ref 4.0–10.5)

## 2021-12-07 NOTE — Progress Notes (Signed)
Bradley Peterson is a 81 y.o. 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 [employer] brand allergy med PRN only     Cholecalciferol (VITAMIN D3) 2000 UNITS capsule Take 2,000 Units by mouth daily.     Ferrous Sulfate (IRON) 90 (18 Fe) MG TABS Take 1 capsule by mouth.     fluticasone (FLONASE) 50 MCG/ACT nasal spray Place 2 sprays into both nostrils as needed. 16 g 3   ZINC-VITAMIN C PO Take by mouth.     No current facility-administered medications for this visit.    Allergies: Patient has no known allergies.  Past Medical History:  Diagnosis Date   Allergic state 12/27/2013   Allergy    Arthritis of neck    Heart murmur    Positive TB test     Past Surgical History:  Procedure Laterality Date   COLONOSCOPY  01/2011   DENTAL SURGERY     TONSILLECTOMY     Late 1940s   UPPER GASTROINTESTINAL ENDOSCOPY  01/2011    Family History  Problem Relation Age of Onset   Heart disease Father        CABG at 49, died age 34   Arthritis Father    Arthritis Mother        Living 22   Hypertension Mother    Hyperlipidemia Mother    Stroke Mother    Colon cancer Neg Hx    Colon polyps Neg Hx    Esophageal cancer Neg Hx    Rectal cancer Neg Hx    Stomach cancer Neg Hx     Social History   Tobacco Use   Smoking status: Former    Types: Pipe     Start date: 02/19/1977   Smokeless tobacco: Never   Tobacco comments:    >25 years ago, pipe only.  Substance Use Topics   Alcohol use: Yes    Comment: Maybe 1/2 drink a week- seldom     Subjective:   Concern for hemorrhoids; had some bright red bleeding last Sunday and seems to be improving/ almost resolved at this time; admits that has been having some constipation; no other acute concerns today;     Objective:  Vitals:   12/07/21 1101  BP: 110/70  Pulse: 79  Temp: 98 F (36.7 C)  TempSrc: Oral  SpO2: 98%  Weight: 158 lb 14.4 oz (72.1 kg)  Height: '5\' 8"'$  (1.727 m)    General: Well developed, well nourished, in no acute distress  Skin : Warm and dry.  Head: Normocephalic and atraumatic  Lungs: Respirations unlabored;  Neurologic: Alert and oriented; speech intact; face symmetrical; moves all extremities well; CNII-XII intact without focal deficit  Patient defers rectal exam today;  Assessment:  1. Hemorrhoids, unspecified hemorrhoid type     Plan:  Per subjective description, appears to be resolving; ? Small rectal tear due to recent  constipation; will check CBC to ensure not anemic; Patient will plan to see his PCP for yearly follow up in December;   No follow-ups on file.  Orders Placed This Encounter  Procedures   CBC with Differential/Platelet    Requested Prescriptions    No prescriptions requested or ordered in this encounter

## 2022-01-30 ENCOUNTER — Encounter: Payer: Self-pay | Admitting: Family Medicine

## 2022-01-30 ENCOUNTER — Ambulatory Visit: Payer: Self-pay

## 2022-01-30 ENCOUNTER — Ambulatory Visit: Payer: Medicare HMO | Admitting: Family Medicine

## 2022-01-30 VITALS — BP 128/80 | Ht 69.0 in | Wt 160.0 lb

## 2022-01-30 DIAGNOSIS — M25411 Effusion, right shoulder: Secondary | ICD-10-CM | POA: Insufficient documentation

## 2022-01-30 DIAGNOSIS — M25511 Pain in right shoulder: Secondary | ICD-10-CM

## 2022-01-30 MED ORDER — TRIAMCINOLONE ACETONIDE 40 MG/ML IJ SUSP
40.0000 mg | Freq: Once | INTRAMUSCULAR | Status: AC
  Administered 2022-01-30: 40 mg via INTRA_ARTICULAR

## 2022-01-30 NOTE — Progress Notes (Signed)
  Bradley Peterson - 81 y.o. male MRN [id number]  Date of birth: December 20, 1940  SUBJECTIVE:  Including CC & ROS.  No chief complaint on file.   Bradley Peterson is a 81 y.o. male that is presenting with right shoulder pain.  He was doing some lifting recently and felt a twinge of the shoulder.  Having soreness around the shoulder as well as in the upper arm.  No history of similar pain.  No history of surgery.    Review of Systems See HPI   HISTORY: Past Medical, Surgical, Social, and Family History Reviewed & Updated per EMR.   Pertinent Historical Findings include:  Past Medical History:  Diagnosis Date   Allergic state 12/27/2013   Allergy    Arthritis of neck    Heart murmur    Positive TB test     Past Surgical History:  Procedure Laterality Date   COLONOSCOPY  01/2011   DENTAL SURGERY     TONSILLECTOMY     Late 1940s   UPPER GASTROINTESTINAL ENDOSCOPY  01/2011     PHYSICAL EXAM:  VS: BP 128/80   Ht '5\' 9"'$  (1.753 m)   Wt 160 lb (72.6 kg)   BMI 23.63 kg/m  Physical Exam Gen: NAD, alert, cooperative with exam, well-appearing MSK:  Neurovascularly intact    Limited ultrasound: Right shoulder pain:  Biceps tendon appears to be chronically torn. Overlying effusion of the subscapularis. Degenerative changes of the supraspinatus with small tears within the anterior leaflet. Mild effusion within the posterior glenohumeral joint.  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  Ultrasound and interpretation by Clearance Coots, MD  Injection/Aspiration Bradley Peterson [birth year]  Procedure: Injection Indications: right shoulder pain  Procedure Details Consent: Risks of procedure as well as the alternatives and risks of each were explained to the (patient/caregiver).  Consent for procedure obtained. Time Out: Verified patient identification, verified procedure, site/side was marked, verified correct patient position, special equipment/implants available,  medications/allergies/relevent history reviewed, required imaging and test results available.  Performed.  The area was cleaned with iodine and alcohol swabs.   The right glenohumeral joint was injected using 3 cc of 1% lidocaine on a 22-gauge 3-1/2 inch needle.  The syringe was switched and a mixture containing 1 cc of Kenalog and 4 cc of 0.25% bupivacaine was injected.  Ultrasound was used. Images were obtained in long views showing the injection.     A sterile dressing was applied.  Patient did tolerate procedure well.    ASSESSMENT & PLAN:   Shoulder effusion, right Acutely occurring. Degenerative changes of the cuff appear more chronic with effusion more acute - counseled on home exercise therapy and supportive care. -Injection today. -Could consider physical therapy or further imaging.

## 2022-01-30 NOTE — Assessment & Plan Note (Signed)
Acutely occurring. Degenerative changes of the cuff appear more chronic with effusion more acute - counseled on home exercise therapy and supportive care. -Injection today. -Could consider physical therapy or further imaging.

## 2022-01-30 NOTE — Patient Instructions (Signed)
Good to see you Please try heat before exercise and ice after  Please try the exercises  You can consider voltaren over the counter  Please send me a message in MyChart with any questions or updates.  Please see me back in 4 weeks.   --Dr. Raeford Razor

## 2022-02-04 DIAGNOSIS — R739 Hyperglycemia, unspecified: Secondary | ICD-10-CM | POA: Insufficient documentation

## 2022-02-04 NOTE — Assessment & Plan Note (Signed)
hgba1c acceptable, minimize simple carbs. Increase exercise as tolerated.  

## 2022-02-04 NOTE — Assessment & Plan Note (Signed)
Asymptomatic. 

## 2022-02-04 NOTE — Assessment & Plan Note (Signed)
Patient encouraged to maintain heart healthy diet, regular exercise, adequate sleep. Consider daily probiotics. Take medications as prescribed. Labs ordered and reviewed.  Colonoscopy aged out RSV (respiratory syncitial virus) vaccine at pharmacy, Arexvy Covid booster at pharmacy

## 2022-02-05 ENCOUNTER — Ambulatory Visit (INDEPENDENT_AMBULATORY_CARE_PROVIDER_SITE_OTHER): Payer: Medicare HMO | Admitting: Family Medicine

## 2022-02-05 VITALS — BP 128/72 | HR 68 | Temp 98.0°F | Resp 16 | Ht 68.0 in | Wt 158.4 lb

## 2022-02-05 DIAGNOSIS — Z0000 Encounter for general adult medical examination without abnormal findings: Secondary | ICD-10-CM

## 2022-02-05 DIAGNOSIS — I4510 Unspecified right bundle-branch block: Secondary | ICD-10-CM | POA: Diagnosis not present

## 2022-02-05 DIAGNOSIS — Z0001 Encounter for general adult medical examination with abnormal findings: Secondary | ICD-10-CM

## 2022-02-05 DIAGNOSIS — T7840XD Allergy, unspecified, subsequent encounter: Secondary | ICD-10-CM | POA: Diagnosis not present

## 2022-02-05 DIAGNOSIS — R739 Hyperglycemia, unspecified: Secondary | ICD-10-CM | POA: Diagnosis not present

## 2022-02-05 DIAGNOSIS — U071 COVID-19: Secondary | ICD-10-CM | POA: Diagnosis not present

## 2022-02-05 LAB — COMPREHENSIVE METABOLIC PANEL
ALT: 27 U/L (ref 0–53)
AST: 23 U/L (ref 0–37)
Albumin: 4.4 g/dL (ref 3.5–5.2)
Alkaline Phosphatase: 70 U/L (ref 39–117)
BUN: 19 mg/dL (ref 6–23)
CO2: 27 mEq/L (ref 19–32)
Calcium: 9.5 mg/dL (ref 8.4–10.5)
Chloride: 104 mEq/L (ref 96–112)
Creatinine, Ser: 0.93 mg/dL (ref 0.40–1.50)
GFR: 77.65 mL/min (ref >60.00)
Glucose, Bld: 94 mg/dL (ref 70–99)
Potassium: 4.7 mEq/L (ref 3.5–5.1)
Sodium: 141 mEq/L (ref 135–145)
Total Bilirubin: 0.4 mg/dL (ref 0.2–1.2)
Total Protein: 7.2 g/dL (ref 6.0–8.3)

## 2022-02-05 LAB — CBC WITH DIFFERENTIAL/PLATELET
Basophils Absolute: 0 10*3/uL (ref 0.0–0.1)
Basophils Relative: 0.6 % (ref 0.0–3.0)
Eosinophils Absolute: 0.1 10*3/uL (ref 0.0–0.7)
Eosinophils Relative: 2.5 % (ref 0.0–5.0)
HCT: 46.3 % (ref 39.0–52.0)
Hemoglobin: 15.8 g/dL (ref 13.0–17.0)
Lymphocytes Relative: 17.8 % (ref 12.0–46.0)
Lymphs Abs: 1.1 10*3/uL (ref 0.7–4.0)
MCHC: 34.1 g/dL (ref 30.0–36.0)
MCV: 92.2 fl (ref 78.0–100.0)
Monocytes Absolute: 0.8 10*3/uL (ref 0.1–1.0)
Monocytes Relative: 12.8 % — ABNORMAL HIGH (ref 3.0–12.0)
Neutro Abs: 3.9 10*3/uL (ref 1.4–7.7)
Neutrophils Relative %: 66.3 % (ref 43.0–77.0)
Platelets: 205 10*3/uL (ref 150.0–400.0)
RBC: 5.03 Mil/uL (ref 4.22–5.81)
RDW: 13.4 % (ref 11.5–15.5)
WBC: 5.9 10*3/uL (ref 4.0–10.5)

## 2022-02-05 LAB — TSH: TSH: 1.02 u[IU]/mL (ref 0.35–5.50)

## 2022-02-05 LAB — LIPID PANEL
Cholesterol: 141 mg/dL (ref 0–200)
HDL: 62.3 mg/dL (ref >39.00)
LDL Cholesterol: 68 mg/dL (ref 0–99)
NonHDL: 79.17
Total CHOL/HDL Ratio: 2
Triglycerides: 55 mg/dL (ref 0.0–149.0)
VLDL: 11 mg/dL (ref 0.0–40.0)

## 2022-02-05 LAB — POCT INFLUENZA A/B

## 2022-02-05 LAB — HEMOGLOBIN A1C: Hgb A1c MFr Bld: 6.2 % (ref 4.6–6.5)

## 2022-02-05 LAB — POC COVID19 BINAXNOW: SARS Coronavirus 2 Ag: POSITIVE — AB

## 2022-02-05 MED ORDER — NIRMATRELVIR/RITONAVIR (PAXLOVID)TABLET: 3.0000 | ORAL_TABLET | Freq: Two times a day (BID) | ORAL | 0 refills | Status: AC

## 2022-02-05 NOTE — Progress Notes (Signed)
Subjective:    Patient ID: Bradley Peterson, male    DOB: 1941/01/24, 81 y.o.   MRN: [id number]  Chief Complaint  Patient presents with   Annual Exam    Annual Exam     HPI Patient is in today for annual preventative exam and follow up on chronic health concerns. No acute concerns. No recent febrile illness or recent hospitalizations. He does note a congh and head congestion started to increase on Friday roughly 3 days ago but denies sore throat, headach, ear pain, anorexia. He believes it is allergies. Denies CP/palp/SOB/HA/fevers/GI or GU c/o. Taking meds as prescribed  He walks over 8000 steps a day and eats a heart healthy diet. Could hydrate some better but otherwise ding well.   Past Medical History:  Diagnosis Date   Allergic state 12/27/2013   Allergy    Arthritis of neck    Heart murmur    Positive TB test     Past Surgical History:  Procedure Laterality Date   COLONOSCOPY  01/2011   DENTAL SURGERY     TONSILLECTOMY     Late 1940s   UPPER GASTROINTESTINAL ENDOSCOPY  01/2011    Family History  Problem Relation Age of Onset   Heart disease Father        CABG at 52, died age 59   Arthritis Father    Arthritis Mother        Living 37   Hypertension Mother    Hyperlipidemia Mother    Stroke Mother    Colon cancer Neg Hx    Colon polyps Neg Hx    Esophageal cancer Neg Hx    Rectal cancer Neg Hx    Stomach cancer Neg Hx     Social History   Socioeconomic History   Marital status: Married    Spouse name: Not on file   Number of children: 3   Years of education: Not on file   Highest education level: Not on file  Occupational History   Not on file  Tobacco Use   Smoking status: Former    Types: Pipe    Start date: 02/19/1977   Smokeless tobacco: Never   Tobacco comments:    >25 years ago, pipe only.  Vaping Use   Vaping Use: Never used  Substance and Sexual Activity   Alcohol use: Yes    Comment: Maybe 1/2 drink a week- seldom    Drug use: No    Sexual activity: Yes    Comment: lives with wife, retired from education, [employer], no major dietary restrictions  Other Topics Concern   Not on file  Social History Narrative   Retired Pharmacist, community.    Lives with wife   No major dietary restrictions   Social Determinants of Health   Financial Resource Strain: Low Risk  (04/06/2021)   Overall Financial Resource Strain (CARDIA)    Difficulty of Paying Living Expenses: Not hard at all  Food Insecurity: No Food Insecurity (04/06/2021)   Hunger Vital Sign    Worried About Running Out of Food in the Last Year: Never true    Ran Out of Food in the Last Year: Never true  Transportation Needs: No Transportation Needs (04/06/2021)   PRAPARE - Hydrologist (Medical): No    Lack of Transportation (Non-Medical): No  Physical Activity: Sufficiently Active (04/06/2021)   Exercise Vital Sign    Days of Exercise per Week: 7 days  Minutes of Exercise per Session: 60 min  Stress: No Stress Concern Present (04/06/2021)   [place]    Feeling of Stress : Not at all  Social Connections: [place] (04/06/2021)   Social Connection and Isolation Panel [NHANES]    Frequency of Communication with Friends and Family: More than three times a week    Frequency of Social Gatherings with Friends and Family: More than three times a week    Attends Religious Services: More than 4 times per year    Active Member of [employer] or Organizations: Yes    Attends Music therapist: More than 4 times per year    Marital Status: Married  Human resources officer Violence: Not At Risk (04/06/2021)   Humiliation, Afraid, Rape, and Kick questionnaire    Fear of Current or Ex-Partner: No    Emotionally Abused: No    Physically Abused: No    Sexually Abused: No    Outpatient Medications Prior to Visit  Medication Sig Dispense Refill    Chlorpheniramine Maleate (ALLERGY PO) Take by mouth. [employer] brand allergy med PRN only     Cholecalciferol (VITAMIN D3) 2000 UNITS capsule Take 2,000 Units by mouth daily.     Ferrous Sulfate (IRON) 90 (18 Fe) MG TABS Take 1 capsule by mouth.     fluticasone (FLONASE) 50 MCG/ACT nasal spray Place 2 sprays into both nostrils as needed. 16 g 3   ZINC-VITAMIN C PO Take by mouth.     No facility-administered medications prior to visit.    No Known Allergies  Review of Systems  Constitutional:  Negative for chills, fever and malaise/fatigue.  HENT:  Positive for congestion. Negative for hearing loss.   Eyes:  Negative for discharge.  Respiratory:  Positive for cough. Negative for sputum production and shortness of breath.   Cardiovascular:  Negative for chest pain, palpitations and leg swelling.  Gastrointestinal:  Negative for abdominal pain, blood in stool, constipation, diarrhea, heartburn, nausea and vomiting.  Genitourinary:  Negative for dysuria, frequency, hematuria and urgency.  Musculoskeletal:  Negative for back pain, falls and myalgias.  Skin:  Negative for rash.  Neurological:  Negative for dizziness, sensory change, loss of consciousness, weakness and headaches.  Endo/Heme/Allergies:  Negative for environmental allergies. Does not bruise/bleed easily.  Psychiatric/Behavioral:  Negative for depression and suicidal ideas. The patient is not nervous/anxious and does not have insomnia.        Objective:    Physical Exam Constitutional:      General: He is not in acute distress.    Appearance: Normal appearance. He is not ill-appearing or toxic-appearing.  HENT:     Head: Normocephalic and atraumatic.     Right Ear: Tympanic membrane and external ear normal.     Left Ear: Tympanic membrane and external ear normal.     Nose: Nose normal.  Eyes:     General:        Right eye: No discharge.        Left eye: No discharge.     Extraocular Movements: Extraocular movements  intact.     Conjunctiva/sclera: Conjunctivae normal.     Pupils: Pupils are equal, round, and reactive to light.  Cardiovascular:     Heart sounds: Normal heart sounds. No murmur heard. Pulmonary:     Effort: Pulmonary effort is normal.     Breath sounds: No wheezing.  Abdominal:     Palpations: Abdomen is soft.  Tenderness: There is no abdominal tenderness. There is no guarding.  Musculoskeletal:        General: Normal range of motion.     Right lower leg: No edema.     Left lower leg: No edema.  Skin:    Findings: No rash.  Neurological:     Mental Status: He is alert and oriented to person, place, and time.     Sensory: No sensory deficit.     Gait: Gait normal.  Psychiatric:        Mood and Affect: Mood normal.        Behavior: Behavior normal.     BP 128/72 (BP Location: Right Arm, Patient Position: Sitting, Cuff Size: Normal)   Pulse 68   Temp 98 F (36.7 C) (Oral)   Resp 16   Ht '5\' 8"'$  (1.727 m)   Wt 158 lb 6.4 oz (71.8 kg)   SpO2 96%   BMI 24.08 kg/m  Wt Readings from Last 3 Encounters:  02/05/22 158 lb 6.4 oz (71.8 kg)  01/30/22 160 lb (72.6 kg)  12/07/21 158 lb 14.4 oz (72.1 kg)    Diabetic Foot Exam - Simple   No data filed    Lab Results  Component Value Date   WBC 7.9 12/07/2021   HGB 14.6 12/07/2021   HCT 43.5 12/07/2021   PLT 262.0 12/07/2021   GLUCOSE 91 08/03/2021   CHOL 158 01/27/2021   TRIG 109.0 01/27/2021   HDL 59.80 01/27/2021   LDLCALC 77 01/27/2021   ALT 20 08/03/2021   AST 21 08/03/2021   NA 140 08/03/2021   K 4.5 08/03/2021   CL 104 08/03/2021   CREATININE 0.75 08/03/2021   BUN 15 08/03/2021   CO2 29 08/03/2021   TSH 0.79 01/27/2021   PSA 0.87 01/27/2021   HGBA1C 5.7 08/03/2021    Lab Results  Component Value Date   TSH 0.79 01/27/2021   Lab Results  Component Value Date   WBC 7.9 12/07/2021   HGB 14.6 12/07/2021   HCT 43.5 12/07/2021   MCV 91.8 12/07/2021   PLT 262.0 12/07/2021   Lab Results  Component  Value Date   NA 140 08/03/2021   K 4.5 08/03/2021   CO2 29 08/03/2021   GLUCOSE 91 08/03/2021   BUN 15 08/03/2021   CREATININE 0.75 08/03/2021   BILITOT 0.6 08/03/2021   ALKPHOS 62 08/03/2021   AST 21 08/03/2021   ALT 20 08/03/2021   PROT 7.0 08/03/2021   ALBUMIN 4.4 08/03/2021   CALCIUM 9.8 08/03/2021   ANIONGAP 6 01/31/2016   GFR 85.65 08/03/2021   Lab Results  Component Value Date   CHOL 158 01/27/2021   Lab Results  Component Value Date   HDL 59.80 01/27/2021   Lab Results  Component Value Date   LDLCALC 77 01/27/2021   Lab Results  Component Value Date   TRIG 109.0 01/27/2021   Lab Results  Component Value Date   CHOLHDL 3 01/27/2021   Lab Results  Component Value Date   HGBA1C 5.7 08/03/2021       Assessment & Plan:  Preventative health care Assessment & Plan: Patient encouraged to maintain heart healthy diet, regular exercise, adequate sleep. Consider daily probiotics. Take medications as prescribed. Labs ordered and reviewed.  Colonoscopy aged out RSV (respiratory syncitial virus) vaccine at pharmacy, Arexvy Covid booster at pharmacy  Orders: -     CBC with Differential/Platelet -     Comprehensive metabolic panel -  Lipid panel -     TSH  Right bundle branch block Assessment & Plan: Asymptomatic   Orders: -     CBC with Differential/Platelet -     Comprehensive metabolic panel -     Lipid panel -     TSH  Hyperglycemia Assessment & Plan: hgba1c acceptable, minimize simple carbs. Increase exercise as tolerated.   Orders: -     Hemoglobin A1c  COVID-19 Assessment & Plan: Started on Paxlovid, Mucinex and hydrate and rest well. Clal with any concerns. Quarantine 5 to 7 days or until symptoms improved  Orders: -     CBC with Differential/Platelet -     Comprehensive metabolic panel  Other orders -     nirmatrelvir/ritonavir EUA; Take 3 tablets by mouth 2 (two) times daily for 5 days. (Take nirmatrelvir 150 mg two tablets twice  daily for 5 days and ritonavir 100 mg one tablet twice daily for 5 days) Patient GFR is 85  Dispense: 30 tablet; Refill: 0    Penni Homans, MD

## 2022-02-05 NOTE — Addendum Note (Signed)
Addended by: Laure Kidney on: 02/05/2022 03:38 PM   Modules accepted: Orders

## 2022-02-05 NOTE — Assessment & Plan Note (Signed)
Started on Paxlovid, Mucinex and hydrate and rest well. Clal with any concerns. Quarantine 5 to 7 days or until symptoms improved

## 2022-02-05 NOTE — Patient Instructions (Addendum)
Consider RSV, Respiratory Syncitial Virus, Arexvy  60-80 ounces of fluids to prevent dementia Multivitamin with minerals and fish oil Learn something new  Plain Mucinex bid x 10 days   Preventive Care 21 Years and Older, Male Preventive care refers to lifestyle choices and visits with your health care provider that can promote health and wellness. Preventive care visits are also called wellness exams. What can I expect for my preventive care visit? Counseling During your preventive care visit, your health care provider may ask about your: Medical history, including: Past medical problems. Family medical history. History of falls. Current health, including: Emotional well-being. Home life and relationship well-being. Sexual activity. Memory and ability to understand (cognition). Lifestyle, including: Alcohol, nicotine or tobacco, and drug use. Access to firearms. Diet, exercise, and sleep habits. Work and work Statistician. Sunscreen use. Safety issues such as seatbelt and bike helmet use. Physical exam Your health care provider will check your: Height and weight. These may be used to calculate your BMI (body mass index). BMI is a measurement that tells if you are at a healthy weight. Waist circumference. This measures the distance around your waistline. This measurement also tells if you are at a healthy weight and may help predict your risk of certain diseases, such as type 2 diabetes and high blood pressure. Heart rate and blood pressure. Body temperature. Skin for abnormal spots. What immunizations do I need?  Vaccines are usually given at various ages, according to a schedule. Your health care provider will recommend vaccines for you based on your age, medical history, and lifestyle or other factors, such as travel or where you work. What tests do I need? Screening Your health care provider may recommend screening tests for certain conditions. This may include: Lipid and  cholesterol levels. Diabetes screening. This is done by checking your blood sugar (glucose) after you have not eaten for a while (fasting). Hepatitis C test. Hepatitis B test. HIV (human immunodeficiency virus) test. STI (sexually transmitted infection) testing, if you are at risk. Lung cancer screening. Colorectal cancer screening. Prostate cancer screening. Abdominal aortic aneurysm (AAA) screening. You may need this if you are a current or former smoker. Talk with your health care provider about your test results, treatment options, and if necessary, the need for more tests. Follow these instructions at home: Eating and drinking  Eat a diet that includes fresh fruits and vegetables, whole grains, lean protein, and low-fat dairy products. Limit your intake of foods with high amounts of sugar, saturated fats, and salt. Take vitamin and mineral supplements as recommended by your health care provider. Do not drink alcohol if your health care provider tells you not to drink. If you drink alcohol: Limit how much you have to 0-2 drinks a day. Know how much alcohol is in your drink. In the [place] one drink equals one 12 oz bottle of beer (355 mL), one 5 oz glass of wine (148 mL), or one 1 oz glass of hard liquor (44 mL). Lifestyle Brush your teeth every morning and night with fluoride toothpaste. Floss one time each day. Exercise for at least 30 minutes 5 or more days each week. Do not use any products that contain nicotine or tobacco. These products include cigarettes, chewing tobacco, and vaping devices, such as e-cigarettes. If you need help quitting, ask your health care provider. Do not use drugs. If you are sexually active, practice safe sex. Use a condom or other form of protection to prevent STIs. Take aspirin only as told  by your health care provider. Make sure that you understand how much to take and what form to take. Work with your health care provider to find out whether it is safe  and beneficial for you to take aspirin daily. Ask your health care provider if you need to take a cholesterol-lowering medicine (statin). Find healthy ways to manage stress, such as: Meditation, yoga, or listening to music. Journaling. Talking to a trusted person. Spending time with friends and family. Safety Always wear your seat belt while driving or riding in a vehicle. Do not drive: If you have been drinking alcohol. Do not ride with someone who has been drinking. When you are tired or distracted. While texting. If you have been using any mind-altering substances or drugs. Wear a helmet and other protective equipment during sports activities. If you have firearms in your house, make sure you follow all gun safety procedures. Minimize exposure to UV radiation to reduce your risk of skin cancer. What's next? Visit your health care provider once a year for an annual wellness visit. Ask your health care provider how often you should have your eyes and teeth checked. Stay up to date on all vaccines. This information is not intended to replace advice given to you by your health care provider. Make sure you discuss any questions you have with your health care provider. Document Revised: 08/03/2020 Document Reviewed: 08/03/2020 Elsevier Patient Education  [place].

## 2022-02-26 ENCOUNTER — Ambulatory Visit: Payer: Medicare HMO | Admitting: Family Medicine

## 2022-02-26 ENCOUNTER — Other Ambulatory Visit (HOSPITAL_BASED_OUTPATIENT_CLINIC_OR_DEPARTMENT_OTHER): Payer: Self-pay

## 2022-02-26 ENCOUNTER — Encounter: Payer: Self-pay | Admitting: Family Medicine

## 2022-02-26 VITALS — BP 118/64 | Ht 68.0 in | Wt 160.0 lb

## 2022-02-26 DIAGNOSIS — M25411 Effusion, right shoulder: Secondary | ICD-10-CM

## 2022-02-26 MED ORDER — AREXVY 120 MCG/0.5ML IM SUSR
INTRAMUSCULAR | 0 refills | Status: DC
  Filled 2022-02-26: qty 1, 1d supply, fill #0

## 2022-02-26 NOTE — Progress Notes (Signed)
  Bradley Peterson - 82 y.o. male MRN [id number]  Date of birth: 1940/07/08  SUBJECTIVE:  Including CC & ROS.  No chief complaint on file.   Bradley Peterson is a 82 y.o. male that is following up for his right shoulder pain.  He was doing the exercises and felt a pain in his neck.  He has had pain in the neck previously.  No history of neck surgery.  His shoulder feels better and he is able to do his normal activities.   Review of Systems See HPI   HISTORY: Past Medical, Surgical, Social, and Family History Reviewed & Updated per EMR.   Pertinent Historical Findings include:  Past Medical History:  Diagnosis Date   Allergic state 12/27/2013   Allergy    Arthritis of neck    Heart murmur    Positive TB test     Past Surgical History:  Procedure Laterality Date   COLONOSCOPY  01/2011   DENTAL SURGERY     TONSILLECTOMY     Late 1940s   UPPER GASTROINTESTINAL ENDOSCOPY  01/2011     PHYSICAL EXAM:  VS: BP 118/64   Ht '5\' 8"'$  (1.727 m)   Wt 160 lb (72.6 kg)   BMI 24.33 kg/m  Physical Exam Gen: NAD, alert, cooperative with exam, well-appearing MSK:  Neurovascularly intact       ASSESSMENT & PLAN:   Shoulder effusion, right Doing well since the injection.  Putting weight to the exercises exacerbated his neck pain. -Counseled on home exercise therapy and supportive care. -Could consider physical therapy or further imaging

## 2022-02-26 NOTE — Assessment & Plan Note (Signed)
Doing well since the injection.  Putting weight to the exercises exacerbated his neck pain. -Counseled on home exercise therapy and supportive care. -Could consider physical therapy or further imaging

## 2022-04-09 ENCOUNTER — Ambulatory Visit: Payer: Medicare HMO

## 2022-04-16 ENCOUNTER — Ambulatory Visit (INDEPENDENT_AMBULATORY_CARE_PROVIDER_SITE_OTHER): Payer: Medicare HMO | Admitting: *Deleted

## 2022-04-16 DIAGNOSIS — Z0000 Encounter for general adult medical examination without abnormal findings: Secondary | ICD-10-CM

## 2022-04-16 NOTE — Patient Instructions (Signed)
Bradley Peterson , Thank you for taking time to come for your Medicare Wellness Visit. I appreciate your ongoing commitment to your health goals. Please review the following plan we discussed and let me know if I can assist you in the future.   This is a list of the screening recommended for you and due dates:  Health Maintenance  Topic Date Due   COVID-19 Vaccine (5 - 2023-24 season) 10/20/2021   Medicare Annual Wellness Visit  04/17/2023   DTaP/Tdap/Td vaccine (2 - Td or Tdap) 12/27/2024   Pneumonia Vaccine  Completed   Flu Shot  Completed   Zoster (Shingles) Vaccine  Completed   HPV Vaccine  Aged Out     Next appointment: Follow up in one year for your annual wellness visit.   Preventive Care 84 Years and Older, Male Preventive care refers to lifestyle choices and visits with your health care provider that can promote health and wellness. What does preventive care include? A yearly physical exam. This is also called an annual well check. Dental exams once or twice a year. Routine eye exams. Ask your health care provider how often you should have your eyes checked. Personal lifestyle choices, including: Daily care of your teeth and gums. Regular physical activity. Eating a healthy diet. Avoiding tobacco and drug use. Limiting alcohol use. Practicing safe sex. Taking low doses of aspirin every day. Taking vitamin and mineral supplements as recommended by your health care provider. What happens during an annual well check? The services and screenings done by your health care provider during your annual well check will depend on your age, overall health, lifestyle risk factors, and family history of disease. Counseling  Your health care provider may ask you questions about your: Alcohol use. Tobacco use. Drug use. Emotional well-being. Home and relationship well-being. Sexual activity. Eating habits. History of falls. Memory and ability to understand (cognition). Work and  work Statistician. Screening  You may have the following tests or measurements: Height, weight, and BMI. Blood pressure. Lipid and cholesterol levels. These may be checked every 5 years, or more frequently if you are over 44 years old. Skin check. Lung cancer screening. You may have this screening every year starting at age 63 if you have a 30-pack-year history of smoking and currently smoke or have quit within the past 15 years. Fecal occult blood test (FOBT) of the stool. You may have this test every year starting at age 28. Flexible sigmoidoscopy or colonoscopy. You may have a sigmoidoscopy every 5 years or a colonoscopy every 10 years starting at age 83. Prostate cancer screening. Recommendations will vary depending on your family history and other risks. Hepatitis C blood test. Hepatitis B blood test. Sexually transmitted disease (STD) testing. Diabetes screening. This is done by checking your blood sugar (glucose) after you have not eaten for a while (fasting). You may have this done every 1-3 years. Abdominal aortic aneurysm (AAA) screening. You may need this if you are a current or former smoker. Osteoporosis. You may be screened starting at age 66 if you are at high risk. Talk with your health care provider about your test results, treatment options, and if necessary, the need for more tests. Vaccines  Your health care provider may recommend certain vaccines, such as: Influenza vaccine. This is recommended every year. Tetanus, diphtheria, and acellular pertussis (Tdap, Td) vaccine. You may need a Td booster every 10 years. Zoster vaccine. You may need this after age 2. Pneumococcal 13-valent conjugate (PCV13) vaccine. One  dose is recommended after age 41. Pneumococcal polysaccharide (PPSV23) vaccine. One dose is recommended after age 79. Talk to your health care provider about which screenings and vaccines you need and how often you need them. This information is not intended to  replace advice given to you by your health care provider. Make sure you discuss any questions you have with your health care provider. Document Released: 03/04/2015 Document Revised: 10/26/2015 Document Reviewed: 12/07/2014 Elsevier Interactive Patient Education  2017 [place] Prevention in the Home Falls can cause injuries. They can happen to people of all ages. There are many things you can do to make your home safe and to help prevent falls. What can I do on the outside of my home? Regularly fix the edges of walkways and driveways and fix any cracks. Remove anything that might make you trip as you walk through a door, such as a raised step or threshold. Trim any bushes or trees on the path to your home. Use bright outdoor lighting. Clear any walking paths of anything that might make someone trip, such as rocks or tools. Regularly check to see if handrails are loose or broken. Make sure that both sides of any steps have handrails. Any raised decks and porches should have guardrails on the edges. Have any leaves, snow, or ice cleared regularly. Use sand or salt on walking paths during winter. Clean up any spills in your garage right away. This includes oil or grease spills. What can I do in the bathroom? Use night lights. Install grab bars by the toilet and in the tub and shower. Do not use towel bars as grab bars. Use non-skid mats or decals in the tub or shower. If you need to sit down in the shower, use a plastic, non-slip stool. Keep the floor dry. Clean up any water that spills on the floor as soon as it happens. Remove soap buildup in the tub or shower regularly. Attach bath mats securely with double-sided non-slip rug tape. Do not have throw rugs and other things on the floor that can make you trip. What can I do in the bedroom? Use night lights. Make sure that you have a light by your bed that is easy to reach. Do not use any sheets or blankets that are too big for  your bed. They should not hang down onto the floor. Have a firm chair that has side arms. You can use this for support while you get dressed. Do not have throw rugs and other things on the floor that can make you trip. What can I do in the kitchen? Clean up any spills right away. Avoid walking on wet floors. Keep items that you use a lot in easy-to-reach places. If you need to reach something above you, use a strong step stool that has a grab bar. Keep electrical cords out of the way. Do not use floor polish or wax that makes floors slippery. If you must use wax, use non-skid floor wax. Do not have throw rugs and other things on the floor that can make you trip. What can I do with my stairs? Do not leave any items on the stairs. Make sure that there are handrails on both sides of the stairs and use them. Fix handrails that are broken or loose. Make sure that handrails are as long as the stairways. Check any carpeting to make sure that it is firmly attached to the stairs. Fix any carpet that is loose or worn.  Avoid having throw rugs at the top or bottom of the stairs. If you do have throw rugs, attach them to the floor with carpet tape. Make sure that you have a light switch at the top of the stairs and the bottom of the stairs. If you do not have them, ask someone to add them for you. What else can I do to help prevent falls? Wear shoes that: Do not have high heels. Have rubber bottoms. Are comfortable and fit you well. Are closed at the toe. Do not wear sandals. If you use a stepladder: Make sure that it is fully opened. Do not climb a closed stepladder. Make sure that both sides of the stepladder are locked into place. Ask someone to hold it for you, if possible. Clearly mark and make sure that you can see: Any grab bars or handrails. First and last steps. Where the edge of each step is. Use tools that help you move around (mobility aids) if they are needed. These  include: Canes. Walkers. Scooters. Crutches. Turn on the lights when you go into a dark area. Replace any light bulbs as soon as they burn out. Set up your furniture so you have a clear path. Avoid moving your furniture around. If any of your floors are uneven, fix them. If there are any pets around you, be aware of where they are. Review your medicines with your doctor. Some medicines can make you feel dizzy. This can increase your chance of falling. Ask your doctor what other things that you can do to help prevent falls. This information is not intended to replace advice given to you by your health care provider. Make sure you discuss any questions you have with your health care provider. Document Released: 12/02/2008 Document Revised: 07/14/2015 Document Reviewed: 03/12/2014 Elsevier Interactive Patient Education  2017 [employer].

## 2022-04-16 NOTE — Progress Notes (Signed)
Subjective:   Bradley Peterson is a 82 y.o. male who presents for Medicare Annual/Subsequent preventive examination.  I connected with  Bradley Peterson on 04/16/22 by a audio enabled telemedicine application and verified that I am speaking with the correct person using two identifiers.  Patient Location: Home  Provider Location: Office/Clinic  I discussed the limitations of evaluation and management by telemedicine. The patient expressed understanding and agreed to proceed.   Review of Systems    Defer to PCP Cardiac Risk Factors include: advanced age (>2mn, >>87women);male gender     Objective:    There were no vitals filed for this visit. There is no height or weight on file to calculate BMI.     04/16/2022    8:59 AM 04/06/2021    8:25 AM 04/01/2020    4:23 PM 01/03/2017    9:37 AM 01/31/2016   10:22 AM 12/28/2014   10:43 AM  Advanced Directives  Does Patient Have a Medical Advance Directive? Yes Yes Yes Yes Yes Yes  Type of AParamedicof [place] N[place] will HWain[place]haLiving will Healthcare Power of ARio RanchoLiving will Living will HHazenLiving will  Does patient want to make changes to medical advance directive? No - Patient declined     No - Patient declined  Copy of [place]in Chart? Yes - validated most recent copy scanned in chart (See row information) Yes - validated most recent copy scanned in chart (See row information) Yes - validated most recent copy scanned in chart (See row information) No - copy requested  No - copy requested    Current Medications (verified) Outpatient Encounter Medications as of 04/16/2022  Medication Sig   Multiple Vitamins-Minerals (MULTIVITAMIN WITH MINERALS) tablet Take 1 tablet by mouth daily.   Chlorpheniramine Maleate (ALLERGY PO) Take by mouth. [employer] brand allergy med PRN only   Cholecalciferol (VITAMIN D3) 2000  UNITS capsule Take 2,000 Units by mouth daily.   Ferrous Sulfate (IRON) 90 (18 Fe) MG TABS Take 1 capsule by mouth.   fluticasone (FLONASE) 50 MCG/ACT nasal spray Place 2 sprays into both nostrils as needed.   [DISCONTINUED] RSV vaccine recomb adjuvanted (AREXVY) 120 MCG/0.5ML injection Inject into the muscle.   [DISCONTINUED] ZINC-VITAMIN C PO Take by mouth.   No facility-administered encounter medications on file as of 04/16/2022.    Allergies (verified) Patient has no known allergies.   History: Past Medical History:  Diagnosis Date   Allergic state 12/27/2013   Allergy    Arthritis of neck    Heart murmur    Positive TB test    Past Surgical History:  Procedure Laterality Date   COLONOSCOPY  01/2011   DENTAL SURGERY     TONSILLECTOMY     Late 1940s   UPPER GASTROINTESTINAL ENDOSCOPY  01/2011   Family History  Problem Relation Age of Onset   Heart disease Father        CABG at 768 died age 81  Arthritis Father    Arthritis Mother        Living 937  Hypertension Mother    Hyperlipidemia Mother    Stroke Mother    Colon cancer Neg Hx    Colon polyps Neg Hx    Esophageal cancer Neg Hx    Rectal cancer Neg Hx    Stomach cancer Neg Hx    Social History   Socioeconomic History   Marital status:  Married    Spouse name: Not on file   Number of children: 3   Years of education: Not on file   Highest education level: Not on file  Occupational History   Not on file  Tobacco Use   Smoking status: Former    Types: Pipe    Start date: 02/19/1977   Smokeless tobacco: Never   Tobacco comments:    >25 years ago, pipe only.  Vaping Use   Vaping Use: Never used  Substance and Sexual Activity   Alcohol use: Yes    Comment: Maybe 1/2 drink a week- seldom    Drug use: No   Sexual activity: Yes    Comment: lives with wife, retired from education, [place], no major dietary restrictions  Other Topics Concern   Not on file  Social History Narrative    Retired Pharmacist, community.    Lives with wife   No major dietary restrictions   Social Determinants of Health   Financial Resource Strain: Low Risk  (04/06/2021)   Overall Financial Resource Strain (CARDIA)    Difficulty of Paying Living Expenses: Not hard at all  Food Insecurity: No Food Insecurity (04/16/2022)   Hunger Vital Sign    Worried About Running Out of Food in the Last Year: Never true    Ran Out of Food in the Last Year: Never true  Transportation Needs: No Transportation Needs (04/16/2022)   PRAPARE - Hydrologist (Medical): No    Lack of Transportation (Non-Medical): No  Physical Activity: Sufficiently Active (04/06/2021)   Exercise Vital Sign    Days of Exercise per Week: 7 days    Minutes of Exercise per Session: 60 min  Stress: No Stress Concern Present (04/06/2021)   Bradley Peterson    Feeling of Stress : Not at all  Social Connections: [place] (04/06/2021)   Social Connection and Isolation Panel [NHANES]    Frequency of Communication with Friends and Family: More than three times a week    Frequency of Social Gatherings with Friends and Family: More than three times a week    Attends Religious Services: More than 4 times per year    Active Member of [employer] or Organizations: Yes    Attends Music therapist: More than 4 times per year    Marital Status: Married    Tobacco Counseling Counseling given: Not Answered Tobacco comments: >25 years ago, pipe only.   Clinical Intake:  Pre-visit preparation completed: Yes  Pain : No/denies pain  Diabetes: No  How often do you need to have someone help you when you read instructions, pamphlets, or other written materials from your doctor or pharmacy?: 1 - Never  Activities of Daily Living    04/16/2022    9:05 AM  In your present state of health, do you have any difficulty performing the following  activities:  Hearing? 0  Vision? 0  Difficulty concentrating or making decisions? 0  Walking or climbing stairs? 1  Comment knee issues  Dressing or bathing? 0  Doing errands, shopping? 0  Preparing Food and eating ? N  Using the Toilet? N  In the past six months, have you accidently leaked urine? N  Do you have problems with loss of bowel control? N  Managing your Medications? N  Managing your Finances? N  Housekeeping or managing your Housekeeping? N    Patient Care Team: Mosie Lukes,  MD as PCP - General (Family Medicine)  Indicate any recent Medical Services you may have received from other than [facility] providers in the past year (date may be approximate).     Assessment:   This is a routine wellness examination for Bradley Peterson.  Hearing/Vision screen No results found.  Dietary issues and exercise activities discussed: Current Exercise Habits: Home exercise routine, Type of exercise: walking, Time (Minutes): 50, Frequency (Times/Week): 7, Weekly Exercise (Minutes/Week): 350, Intensity: Mild, Exercise limited by: None identified   Goals Addressed   None    Depression Screen    04/16/2022    9:03 AM 02/05/2022    8:25 AM 12/07/2021   11:01 AM 11/30/2021    1:30 PM 04/06/2021    8:27 AM 07/11/2020    8:15 AM 01/11/2020    7:56 AM  PHQ 2/9 Scores  PHQ - 2 Score 0 0 0 0 0 0 0    Fall Risk    04/16/2022    9:01 AM 02/05/2022    8:25 AM 12/07/2021   11:01 AM 11/30/2021    1:30 PM 04/06/2021    8:26 AM  Fall Risk   Falls in the past year? '1  1 1 '$ 0  Comment tripped over tree root in the woods      Number falls in past yr: 0 0 0 0 0  Injury with Fall? 0 0 1 1 0  Risk for fall due to : No Fall Risks  History of fall(s) Impaired balance/gait;Other (Comment)   Follow up Falls evaluation completed Falls evaluation completed Falls prevention discussed;Falls evaluation completed Falls evaluation completed Falls prevention discussed    FALL RISK PREVENTION PERTAINING TO THE  HOME:  Any stairs in or around the home? Yes  If so, are there any without handrails? No  Home free of loose throw rugs in walkways, pet beds, electrical cords, etc? Yes  Adequate lighting in your home to reduce risk of falls? Yes   ASSISTIVE DEVICES UTILIZED TO PREVENT FALLS:  Life alert? No  Use of a cane, walker or w/c? No  Grab bars in the bathroom? Yes  Shower chair or bench in shower? No  Elevated toilet seat or a handicapped toilet?  Comfort height  TIMED UP AND GO:  Was the test performed?  No, audio visit .    Cognitive Function:    01/03/2017    9:38 AM  MMSE - Mini Mental State Exam  Orientation to time 5  Orientation to Place 5  Registration 3  Attention/ Calculation 5  Recall 3  Language- name 2 objects 2  Language- repeat 1  Language- follow 3 step command 3  Language- read & follow direction 1  Write a sentence 1  Copy design 1  Total score 30        04/16/2022    9:08 AM  6CIT Screen  What Year? 0 points  What month? 0 points  What time? 0 points  Count back from 20 0 points  Months in reverse 0 points  Repeat phrase 0 points  Total Score 0 points    Immunizations Immunization History  Administered Date(s) Administered   Fluad Quad(high Dose 65+) 12/08/2021   Influenza, High Dose Seasonal PF 12/23/2012, 12/30/2015, 12/26/2016, 01/06/2018, 11/03/2019   Influenza,inj,Quad PF,6+ Mos 12/24/2013, 12/28/2014   Influenza-Unspecified 11/20/2018, 12/09/2020   PFIZER Comirnaty(Gray Top)Covid-19 Tri-Sucrose Vaccine 09/19/2020   PFIZER(Purple Top)SARS-COV-2 Vaccination 04/04/2019, 04/27/2019, 01/11/2020   Pneumococcal Conjugate-13 12/24/2013   Pneumococcal Polysaccharide-23 12/30/2015   Respiratory  Syncytial Virus Vaccine,Recomb Aduvanted(Arexvy) 02/26/2022   Tdap 12/28/2014   Zoster Recombinat (Shingrix) 08/02/2017, 10/04/2017   Zoster, Live 02/20/2012    TDAP status: Up to date  Flu Vaccine status: Up to date  Pneumococcal vaccine status:  Up to date  Covid-19 vaccine status: Information provided on how to obtain vaccines.   Qualifies for Shingles Vaccine? Yes   Zostavax completed Yes   Shingrix Completed?: Yes  Screening Tests Health Maintenance  Topic Date Due   COVID-19 Vaccine (5 - 2023-24 season) 10/20/2021   Medicare Annual Wellness (AWV)  04/06/2022   DTaP/Tdap/Td (2 - Td or Tdap) 12/27/2024   Pneumonia Vaccine 6+ Years old  Completed   INFLUENZA VACCINE  Completed   Zoster Vaccines- Shingrix  Completed   HPV VACCINES  Aged Out    Health Maintenance  Health Maintenance Due  Topic Date Due   COVID-19 Vaccine (5 - 2023-24 season) 10/20/2021   Medicare Annual Wellness (AWV)  04/06/2022    Colorectal cancer screening: No longer required.   Lung Cancer Screening: (Low Dose CT Chest recommended if Age 22-80 years, 30 pack-year currently smoking OR have quit w/in 15years.) does not qualify.   Additional Screening:  Hepatitis C Screening: does not qualify  Vision Screening: Recommended annual ophthalmology exams for early detection of glaucoma and other disorders of the eye. Is the patient up to date with their annual eye exam?  Yes  Who is the provider or what is the name of the office in which the patient attends annual eye exams? [facility] If pt is not established with a provider, would they like to be referred to a provider to establish care? No .   Dental Screening: Recommended annual dental exams for proper oral hygiene  Community Resource Referral / Chronic Care Management: CRR required this visit?  No   CCM required this visit?  No      Plan:     I have personally reviewed and noted the following in the patient's chart:   Medical and social history Use of alcohol, tobacco or illicit drugs  Current medications and supplements including opioid prescriptions. Patient is not currently taking opioid prescriptions. Functional ability and status Nutritional status Physical  activity Advanced directives List of other physicians Hospitalizations, surgeries, and ER visits in previous 12 months Vitals Screenings to include cognitive, depression, and falls Referrals and appointments  In addition, I have reviewed and discussed with patient certain preventive protocols, quality metrics, and best practice recommendations. A written personalized care plan for preventive services as well as general preventive health recommendations were provided to patient.   Due to this being a telephonic visit, the after visit summary with patients personalized plan was offered to patient via mail or my-chart. Patient would like to access on my-chart.  Beatris Ship, [place]   04/16/2022   Nurse Notes: None

## 2022-06-04 ENCOUNTER — Encounter: Payer: Self-pay | Admitting: *Deleted

## 2022-11-26 ENCOUNTER — Ambulatory Visit: Payer: Medicare HMO | Admitting: Family

## 2022-11-26 VITALS — BP 122/83 | HR 57 | Temp 98.1°F | Resp 16 | Wt 157.0 lb

## 2022-11-26 DIAGNOSIS — M542 Cervicalgia: Secondary | ICD-10-CM | POA: Diagnosis not present

## 2022-11-26 DIAGNOSIS — L237 Allergic contact dermatitis due to plants, except food: Secondary | ICD-10-CM | POA: Diagnosis not present

## 2022-11-26 MED ORDER — MELOXICAM 7.5 MG PO TABS: 7.5000 mg | ORAL_TABLET | Freq: Every day | ORAL | 0 refills | Status: DC

## 2022-11-26 NOTE — Assessment & Plan Note (Signed)
Likely mild poison ivy. He is doing well with topical otc hydrocortisone. Continue same.

## 2022-11-26 NOTE — Patient Instructions (Signed)
VISIT SUMMARY:  During your visit, we discussed two main concerns: the spots on your left leg and the flare-up of your neck pain. The spots on your leg are likely due to mild poison ivy exposure, and you should continue using the over-the-counter cream as needed. 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 If there's no improvement, please let me know so we can consider referring you to physical therapy.  YOUR PLAN:  -DERMATITIS: Dermatitis is a general term that describes an inflammation of the skin. In your case, it's likely caused by poison ivy exposure. Continue using the over-the-counter hydrocortisone cream as needed for relief.  -CERVICAL ARTHRITIS: Cervical arthritis is a condition that causes pain and stiffness in the neck. I've prescribed a course of anti-inflammatory medication to help manage your symptoms. If there's no improvement, we may consider physical therapy.  INSTRUCTIONS:  Please continue to apply the over-the-counter cream to the spots on your leg as needed. Start taking the prescribed anti-inflammatory medication for your neck pain. If your neck pain doesn't improve within 1-2 weeks, please notify me so we can consider further treatment options. Also, it's good to know that you've received your flu shot for the current season and the RSV vaccine in January 2024.

## 2022-11-26 NOTE — Assessment & Plan Note (Signed)
Has hx of osteoarthritis of neck. Rx with trial of meloxicam. He will let me know if his symptoms worsen or if they do not improve. He has seen sports medicine in the past for this and would consider referral to them if no improvement.

## 2022-11-26 NOTE — Progress Notes (Signed)
Subjective:     Patient ID: Bradley Peterson, male    DOB: 1940-06-09, 83 y.o.   MRN: [id number]  Chief Complaint  Patient presents with   Neck Pain    Complains of neck pain that is getting worse in the last week   Skin lesion    Complains of skin lesions on left leg    Discussed the use of AI scribe software for clinical note transcription with the patient, who gave verbal consent to proceed.  History of Present Illness   The patient presents with two primary concerns. Firstly, he noticed spots on his left leg about a week and a half ago after working in the woods. He has been applying an over-the-counter cream to the spots, which he describes as itchy. He was wearing long pants at the time he believes he was exposed to the irritant, which he suspects may have been poison ivy.  Secondly, the patient is experiencing neck pain, which he attributes to arthritis. He has experienced this pain before and has previously sought treatment from a sports medicine specialist. The pain tends to be worse in the morning and improves as he moves around throughout the day. He finds heat and topical treatments like Icy Hot to be helpful, and he also takes ibuprofen for the pain. He reports occasional numbness radiating down both arms and some instability, which he believes is weather-related. He has not had any surgeries on his neck.          Health Maintenance Due  Topic Date Due   COVID-19 Vaccine (5 - 2023-24 season) 10/21/2022    Past Medical History:  Diagnosis Date   Allergic state 12/27/2013   Allergy    Arthritis of neck    Heart murmur    Positive TB test     Past Surgical History:  Procedure Laterality Date   COLONOSCOPY  01/2011   DENTAL SURGERY     TONSILLECTOMY     Late 1940s   UPPER GASTROINTESTINAL ENDOSCOPY  01/2011    Family History  Problem Relation Age of Onset   Heart disease Father        CABG at 19, died age 1   Arthritis Father    Arthritis Mother         Living 27   Hypertension Mother    Hyperlipidemia Mother    Stroke Mother    Colon cancer Neg Hx    Colon polyps Neg Hx    Esophageal cancer Neg Hx    Rectal cancer Neg Hx    Stomach cancer Neg Hx     Social History   Socioeconomic History   Marital status: Married    Spouse name: Not on file   Number of children: 3   Years of education: Not on file   Highest education level: Master's degree (e.g., MA, MS, MEng, MEd, MSW, MBA)  Occupational History   Not on file  Tobacco Use   Smoking status: Former    Types: Pipe    Start date: 02/19/1977   Smokeless tobacco: Never   Tobacco comments:    >25 years ago, pipe only.  Vaping Use   Vaping status: Never Used  Substance and Sexual Activity   Alcohol use: Yes    Comment: Maybe 1/2 drink a week- seldom    Drug use: No   Sexual activity: Yes    Comment: lives with wife, retired from education, Middle school principal, no major dietary restrictions  Other Topics  Concern   Not on file  Social History Narrative   Retired Personal assistant.    Lives with wife   No major dietary restrictions   Social Determinants of Health   Financial Resource Strain: Low Risk  (11/26/2022)   Overall Financial Resource Strain (CARDIA)    Difficulty of Paying Living Expenses: Not hard at all  Food Insecurity: No Food Insecurity (11/26/2022)   Hunger Vital Sign    Worried About Running Out of Food in the Last Year: Never true    Ran Out of Food in the Last Year: Never true  Transportation Needs: No Transportation Needs (11/26/2022)   PRAPARE - Administrator, Civil Service (Medical): No    Lack of Transportation (Non-Medical): No  Physical Activity: Unknown (11/26/2022)   Exercise Vital Sign    Days of Exercise per Week: 6 days    Minutes of Exercise per Session: Not on file  Stress: No Stress Concern Present (11/26/2022)   [employer] of Occupational Health - Occupational Stress Questionnaire    Feeling of Stress : Only a  little  Social Connections: Socially Integrated (11/26/2022)   Social Connection and Isolation Panel [NHANES]    Frequency of Communication with Friends and Family: Once a week    Frequency of Social Gatherings with Friends and Family: Three times a week    Attends Religious Services: More than 4 times per year    Active Member of Clubs or Organizations: Yes    Attends Banker Meetings: Not on file    Marital Status: Married  Intimate Partner Violence: Not At Risk (04/16/2022)   Humiliation, Afraid, Rape, and Kick questionnaire    Fear of Current or Ex-Partner: No    Emotionally Abused: No    Physically Abused: No    Sexually Abused: No    Outpatient Medications Prior to Visit  Medication Sig Dispense Refill   Chlorpheniramine Maleate (ALLERGY PO) Take by mouth. [employer] brand allergy med PRN only     Cholecalciferol (VITAMIN D3) 2000 UNITS capsule Take 2,000 Units by mouth daily.     Ferrous Sulfate (IRON) 90 (18 Fe) MG TABS Take 1 capsule by mouth.     fluticasone (FLONASE) 50 MCG/ACT nasal spray Place 2 sprays into both nostrils as needed. 16 g 3   Multiple Vitamins-Minerals (MULTIVITAMIN WITH MINERALS) tablet Take 1 tablet by mouth daily.     No facility-administered medications prior to visit.    No Known Allergies  Review of Systems  Musculoskeletal:  Positive for neck pain.       Objective:    Physical Exam Constitutional:      General: He is not in acute distress.    Appearance: He is well-developed.  HENT:     Head: Normocephalic and atraumatic.  Cardiovascular:     Rate and Rhythm: Normal rate and regular rhythm.     Heart sounds: No murmur heard. Pulmonary:     Effort: Pulmonary effort is normal. No respiratory distress.     Breath sounds: Normal breath sounds. No wheezing or rales.  Musculoskeletal:     Comments: No cervical tenderness to palpation   Skin:    General: Skin is warm and dry.     Comments: Multiple raised pustular lesions noted  left inner thigh  Neurological:     Mental Status: He is alert and oriented to person, place, and time.     Comments: Bilateral UE strength is 5/5  Psychiatric:  Behavior: Behavior normal.        Thought Content: Thought content normal.       BP 122/83 (BP Location: Right Arm, Patient Position: Sitting, Cuff Size: Small)   Pulse (!) 57   Temp 98.1 F (36.7 C) (Oral)   Resp 16   Wt 157 lb (71.2 kg)   SpO2 99%   BMI 23.87 kg/m  Wt Readings from Last 3 Encounters:  11/26/22 157 lb (71.2 kg)  02/26/22 160 lb (72.6 kg)  02/05/22 158 lb 6.4 oz (71.8 kg)       Assessment & Plan:   Problem List Items Addressed This Visit       Unprioritized   Neck pain - Primary    Has hx of osteoarthritis of neck. Rx with trial of meloxicam. He will let me know if his symptoms worsen or if they do not improve. He has seen sports medicine in the past for this and would consider referral to them if no improvement.       Allergic contact dermatitis due to plants, except food    Likely mild poison ivy. He is doing well with topical otc hydrocortisone. Continue same.        I am having Kyra Manges "Jim" start on meloxicam. I am also having him maintain his Vitamin D3, Iron, fluticasone, Chlorpheniramine Maleate (ALLERGY PO), and multivitamin with minerals.  Meds ordered this encounter  Medications   meloxicam (MOBIC) 7.5 MG tablet    Sig: Take 1 tablet (7.5 mg total) by mouth daily.    Dispense:  14 tablet    Refill:  0    Order Specific Question:   Supervising Provider    Answer:   Danise Edge A [[id number]]

## 2023-02-06 ENCOUNTER — Other Ambulatory Visit: Payer: Self-pay | Admitting: Emergency Medicine

## 2023-02-06 DIAGNOSIS — R739 Hyperglycemia, unspecified: Secondary | ICD-10-CM

## 2023-02-06 DIAGNOSIS — I4510 Unspecified right bundle-branch block: Secondary | ICD-10-CM

## 2023-02-06 DIAGNOSIS — Z0000 Encounter for general adult medical examination without abnormal findings: Secondary | ICD-10-CM

## 2023-02-06 NOTE — Assessment & Plan Note (Signed)
Patient encouraged to maintain heart healthy diet, regular exercise, adequate sleep. Consider daily probiotics. Take medications as prescribed. Labs ordered and reviewed.  Colonoscopy aged out Has ACP documents in chart

## 2023-02-06 NOTE — Assessment & Plan Note (Signed)
hgba1c acceptable, minimize simple carbs. Increase exercise as tolerated.  

## 2023-02-07 ENCOUNTER — Ambulatory Visit (INDEPENDENT_AMBULATORY_CARE_PROVIDER_SITE_OTHER): Payer: Medicare HMO | Admitting: Family Medicine

## 2023-02-07 VITALS — BP 138/78 | HR 64 | Temp 98.0°F | Resp 16 | Ht 69.0 in | Wt 158.8 lb

## 2023-02-07 DIAGNOSIS — D649 Anemia, unspecified: Secondary | ICD-10-CM | POA: Diagnosis not present

## 2023-02-07 DIAGNOSIS — R739 Hyperglycemia, unspecified: Secondary | ICD-10-CM

## 2023-02-07 DIAGNOSIS — I4510 Unspecified right bundle-branch block: Secondary | ICD-10-CM

## 2023-02-07 DIAGNOSIS — Z0000 Encounter for general adult medical examination without abnormal findings: Secondary | ICD-10-CM | POA: Diagnosis not present

## 2023-02-07 LAB — CBC WITH DIFFERENTIAL/PLATELET
Basophils Absolute: 0 10*3/uL (ref 0.0–0.1)
Basophils Relative: 0.6 % (ref 0.0–3.0)
Eosinophils Absolute: 0.1 10*3/uL (ref 0.0–0.7)
Eosinophils Relative: 1.4 % (ref 0.0–5.0)
HCT: 45.3 % (ref 39.0–52.0)
Hemoglobin: 15.1 g/dL (ref 13.0–17.0)
Lymphocytes Relative: 18.3 % (ref 12.0–46.0)
Lymphs Abs: 1.3 10*3/uL (ref 0.7–4.0)
MCHC: 33.4 g/dL (ref 30.0–36.0)
MCV: 92.5 fL (ref 78.0–100.0)
Monocytes Absolute: 0.5 10*3/uL (ref 0.1–1.0)
Monocytes Relative: 7.3 % (ref 3.0–12.0)
Neutro Abs: 5.2 10*3/uL (ref 1.4–7.7)
Neutrophils Relative %: 72.4 % (ref 43.0–77.0)
Platelets: 221 10*3/uL (ref 150.0–400.0)
RBC: 4.89 Mil/uL (ref 4.22–5.81)
RDW: 13.8 % (ref 11.5–15.5)
WBC: 7.2 10*3/uL (ref 4.0–10.5)

## 2023-02-07 LAB — COMPREHENSIVE METABOLIC PANEL
ALT: 19 U/L (ref 0–53)
AST: 20 U/L (ref 0–37)
Albumin: 4.3 g/dL (ref 3.5–5.2)
Alkaline Phosphatase: 66 U/L (ref 39–117)
BUN: 20 mg/dL (ref 6–23)
CO2: 29 meq/L (ref 19–32)
Calcium: 9.7 mg/dL (ref 8.4–10.5)
Chloride: 103 meq/L (ref 96–112)
Creatinine, Ser: 0.83 mg/dL (ref 0.40–1.50)
GFR: 82.19 mL/min (ref >60.00)
Glucose, Bld: 92 mg/dL (ref 70–99)
Potassium: 4.7 meq/L (ref 3.5–5.1)
Sodium: 140 meq/L (ref 135–145)
Total Bilirubin: 0.6 mg/dL (ref 0.2–1.2)
Total Protein: 6.8 g/dL (ref 6.0–8.3)

## 2023-02-07 LAB — TSH: TSH: 0.76 u[IU]/mL (ref 0.35–5.50)

## 2023-02-07 LAB — LIPID PANEL
Cholesterol: 162 mg/dL (ref 0–200)
HDL: 57.3 mg/dL (ref >39.00)
LDL Cholesterol: 88 mg/dL (ref 0–99)
NonHDL: 104.74
Total CHOL/HDL Ratio: 3
Triglycerides: 82 mg/dL (ref 0.0–149.0)
VLDL: 16.4 mg/dL (ref 0.0–40.0)

## 2023-02-07 LAB — HEMOGLOBIN A1C: Hgb A1c MFr Bld: 6.3 % (ref 4.6–6.5)

## 2023-02-07 MED ORDER — IRON 90 (18 FE) MG PO TABS: 1.0000 | ORAL_TABLET | Freq: Every day | ORAL | Status: AC | PRN

## 2023-02-07 NOTE — Patient Instructions (Addendum)
Protein every 3-4 hours  10 ounces of fluids every 1-2 hours  Netflix: Live to 100 the [place]   4000, 8000 steps  Tetanus due 2026 sooner if injured  Prevnar 20 at pharmacy  Consider annual flu and covid boosters  Tizanidine muscle relaxer if neck gets worse again   Preventive Care 65 Years and Older, Male Preventive care refers to lifestyle choices and visits with your health care provider that can promote health and wellness. Preventive care visits are also called wellness exams. What can I expect for my preventive care visit? Counseling During your preventive care visit, your health care provider may ask about your: Medical history, including: Past medical problems. Family medical history. History of falls. Current health, including: Emotional well-being. Home life and relationship well-being. Sexual activity. Memory and ability to understand (cognition). Lifestyle, including: Alcohol, nicotine or tobacco, and drug use. Access to firearms. Diet, exercise, and sleep habits. Work and work Astronomer. Sunscreen use. Safety issues such as seatbelt and bike helmet use. Physical exam Your health care provider will check your: Height and weight. These may be used to calculate your BMI (body mass index). BMI is a measurement that tells if you are at a healthy weight. Waist circumference. This measures the distance around your waistline. This measurement also tells if you are at a healthy weight and may help predict your risk of certain diseases, such as type 2 diabetes and high blood pressure. Heart rate and blood pressure. Body temperature. Skin for abnormal spots. What immunizations do I need?  Vaccines are usually given at various ages, according to a schedule. Your health care provider will recommend vaccines for you based on your age, medical history, and lifestyle or other factors, such as travel or where you work. What tests do I  need? Screening Your health care provider may recommend screening tests for certain conditions. This may include: Lipid and cholesterol levels. Diabetes screening. This is done by checking your blood sugar (glucose) after you have not eaten for a while (fasting). Hepatitis C test. Hepatitis B test. HIV (human immunodeficiency virus) test. STI (sexually transmitted infection) testing, if you are at risk. Lung cancer screening. Colorectal cancer screening. Prostate cancer screening. Abdominal aortic aneurysm (AAA) screening. You may need this if you are a current or former smoker. Talk with your health care provider about your test results, treatment options, and if necessary, the need for more tests. Follow these instructions at home: Eating and drinking  Eat a diet that includes fresh fruits and vegetables, whole grains, lean protein, and low-fat dairy products. Limit your intake of foods with high amounts of sugar, saturated fats, and salt. Take vitamin and mineral supplements as recommended by your health care provider. Do not drink alcohol if your health care provider tells you not to drink. If you drink alcohol: Limit how much you have to 0-2 drinks a day. Know how much alcohol is in your drink. In the [place] one drink equals one 12 oz bottle of beer (355 mL), one 5 oz glass of wine (148 mL), or one 1 oz glass of hard liquor (44 mL). Lifestyle Brush your teeth every morning and night with fluoride toothpaste. Floss one time each day. Exercise for at least 30 minutes 5 or more days each week. Do not use any products that contain nicotine or tobacco. These products include cigarettes, chewing tobacco, and vaping devices, such as e-cigarettes. If you need help quitting, ask your health care provider. Do not  use drugs. If you are sexually active, practice safe sex. Use a condom or other form of protection to prevent STIs. Take aspirin only as told by your health care provider. Make sure  that you understand how much to take and what form to take. Work with your health care provider to find out whether it is safe and beneficial for you to take aspirin daily. Ask your health care provider if you need to take a cholesterol-lowering medicine (statin). Find healthy ways to manage stress, such as: Meditation, yoga, or listening to music. Journaling. Talking to a trusted person. Spending time with friends and family. Safety Always wear your seat belt while driving or riding in a vehicle. Do not drive: If you have been drinking alcohol. Do not ride with someone who has been drinking. When you are tired or distracted. While texting. If you have been using any mind-altering substances or drugs. Wear a helmet and other protective equipment during sports activities. If you have firearms in your house, make sure you follow all gun safety procedures. Minimize exposure to UV radiation to reduce your risk of skin cancer. What's next? Visit your health care provider once a year for an annual wellness visit. Ask your health care provider how often you should have your eyes and teeth checked. Stay up to date on all vaccines. This information is not intended to replace advice given to you by your health care provider. Make sure you discuss any questions you have with your health care provider. Document Revised: 08/03/2020 Document Reviewed: 08/03/2020 Elsevier Patient Education  2024 [employer].

## 2023-02-10 ENCOUNTER — Encounter: Payer: Self-pay | Admitting: Family Medicine

## 2023-02-10 NOTE — Assessment & Plan Note (Signed)
Increase leafy greens, consider increased lean red meat and using cast iron cookware. Continue to monitor, report any concerns, refill given on iron

## 2023-02-10 NOTE — Progress Notes (Signed)
Subjective:    Patient ID: Bradley Peterson, male    DOB: 12/15/1940, 82 y.o.   MRN: [id number]  Chief Complaint  Patient presents with   Annual Exam    HPI Discussed the use of AI scribe software for clinical note transcription with the patient, who gave verbal consent to proceed.  History of Present Illness   The patient, an 82 year old individual with a history of arthritis, presents for a routine check-up. The patient reports a recent episode of transient blurry vision, which [place] the airport. The episode lasted for approximately a minute and a half, with no associated symptoms such as flashing lights, changes in hearing, or head trauma. The patient denies any residual effects from this episode and reports no similar episodes in the past. The patient's hydration and protein intake habits are discussed, with the patient reporting they may have been off some while traveling. The patient is currently on several medications, including an allergy medication, Flonase, and meloxicam, which the patient reports not taking for a long time. The patient also reports regular physical activity, walking three to four days a week for three miles a day.        Past Medical History:  Diagnosis Date   Allergic state 12/27/2013   Allergy    Arthritis of neck    Heart murmur    Positive TB test     Past Surgical History:  Procedure Laterality Date   COLONOSCOPY  01/2011   DENTAL SURGERY     TONSILLECTOMY     Late 1940s   UPPER GASTROINTESTINAL ENDOSCOPY  01/2011    Family History  Problem Relation Age of Onset   Heart disease Father        CABG at 2, died age 54   Arthritis Father    Arthritis Mother        Living 66   Hypertension Mother    Hyperlipidemia Mother    Stroke Mother    Colon cancer Neg Hx    Colon polyps Neg Hx    Esophageal cancer Neg Hx    Rectal cancer Neg Hx    Stomach cancer Neg Hx     Social History   Socioeconomic History   Marital status:  Married    Spouse name: Not on file   Number of children: 3   Years of education: Not on file   Highest education level: Master's degree (e.g., MA, MS, MEng, MEd, MSW, MBA)  Occupational History   Not on file  Tobacco Use   Smoking status: Former    Types: Pipe    Start date: 02/19/1977   Smokeless tobacco: Never   Tobacco comments:    >25 years ago, pipe only.  Vaping Use   Vaping status: Never Used  Substance and Sexual Activity   Alcohol use: Yes    Comment: Maybe 1/2 drink a week- seldom    Drug use: No   Sexual activity: Yes    Comment: lives with wife, retired from education, Middle school principal, no major dietary restrictions  Other Topics Concern   Not on file  Social History Narrative   Retired Personal assistant.    Lives with wife   No major dietary restrictions   Social Drivers of Corporate investment banker Strain: Low Risk  (11/26/2022)   Overall Financial Resource Strain (CARDIA)    Difficulty of Paying Living Expenses: Not hard at all  Food Insecurity: No Food Insecurity (11/26/2022)   Hunger  Vital Sign    Worried About Programme researcher, broadcasting/film/video in the Last Year: Never true    Ran Out of Food in the Last Year: Never true  Transportation Needs: No Transportation Needs (11/26/2022)   PRAPARE - Administrator, Civil Service (Medical): No    Lack of Transportation (Non-Medical): No  Physical Activity: Unknown (11/26/2022)   Exercise Vital Sign    Days of Exercise per Week: 6 days    Minutes of Exercise per Session: Not on file  Stress: No Stress Concern Present (11/26/2022)   [employer] of Occupational Health - Occupational Stress Questionnaire    Feeling of Stress : Only a little  Social Connections: Socially Integrated (11/26/2022)   Social Connection and Isolation Panel [NHANES]    Frequency of Communication with Friends and Family: Once a week    Frequency of Social Gatherings with Friends and Family: Three times a week    Attends  Religious Services: More than 4 times per year    Active Member of Clubs or Organizations: Yes    Attends Banker Meetings: Not on file    Marital Status: Married  Intimate Partner Violence: Not At Risk (04/16/2022)   Humiliation, Afraid, Rape, and Kick questionnaire    Fear of Current or Ex-Partner: No    Emotionally Abused: No    Physically Abused: No    Sexually Abused: No    Outpatient Medications Prior to Visit  Medication Sig Dispense Refill   Chlorpheniramine Maleate (ALLERGY PO) Take by mouth. [employer] brand allergy med PRN only     Cholecalciferol (VITAMIN D3) 2000 UNITS capsule Take 2,000 Units by mouth daily.     fluticasone (FLONASE) 50 MCG/ACT nasal spray Place 2 sprays into both nostrils as needed. 16 g 3   Multiple Vitamins-Minerals (MULTIVITAMIN WITH MINERALS) tablet Take 1 tablet by mouth daily.     Ferrous Sulfate (IRON) 90 (18 Fe) MG TABS Take 1 capsule by mouth.     meloxicam (MOBIC) 7.5 MG tablet Take 1 tablet (7.5 mg total) by mouth daily. 14 tablet 0   No facility-administered medications prior to visit.    No Known Allergies  Review of Systems  Constitutional:  Negative for chills, fever and malaise/fatigue.  HENT:  Negative for congestion and hearing loss.   Eyes:  Negative for discharge.  Respiratory:  Negative for cough, sputum production and shortness of breath.   Cardiovascular:  Negative for chest pain, palpitations and leg swelling.  Gastrointestinal:  Negative for abdominal pain, blood in stool, constipation, diarrhea, heartburn, nausea and vomiting.  Genitourinary:  Negative for dysuria, frequency, hematuria and urgency.  Musculoskeletal:  Positive for joint pain and neck pain. Negative for back pain, falls and myalgias.  Skin:  Negative for rash.  Neurological:  Negative for dizziness, sensory change, loss of consciousness, weakness and headaches.  Endo/Heme/Allergies:  Negative for environmental allergies. Does not bruise/bleed  easily.  Psychiatric/Behavioral:  Negative for depression and suicidal ideas. The patient is not nervous/anxious and does not have insomnia.        Objective:    Physical Exam Vitals reviewed.  Constitutional:      General: He is not in acute distress.    Appearance: Normal appearance. He is not ill-appearing or diaphoretic.  HENT:     Head: Normocephalic and atraumatic.     Right Ear: Tympanic membrane, ear canal and external ear normal. There is no impacted cerumen.     Left Ear: Tympanic membrane, ear canal  and external ear normal. There is no impacted cerumen.     Nose: Nose normal. No rhinorrhea.     Mouth/Throat:     Pharynx: Oropharynx is clear.  Eyes:     General: No scleral icterus.    Extraocular Movements: Extraocular movements intact.     Conjunctiva/sclera: Conjunctivae normal.     Pupils: Pupils are equal, round, and reactive to light.  Neck:     Thyroid: No thyroid mass or thyroid tenderness.  Cardiovascular:     Rate and Rhythm: Normal rate and regular rhythm.     Pulses: Normal pulses.     Heart sounds: Normal heart sounds. No murmur heard. Pulmonary:     Effort: Pulmonary effort is normal.     Breath sounds: Normal breath sounds. No wheezing.  Abdominal:     General: Bowel sounds are normal.     Palpations: Abdomen is soft. There is no mass.     Tenderness: There is no guarding.  Musculoskeletal:        General: No swelling. Normal range of motion.     Cervical back: Normal range of motion and neck supple. No rigidity.     Right lower leg: No edema.     Left lower leg: No edema.  Lymphadenopathy:     Cervical: No cervical adenopathy.  Skin:    General: Skin is warm and dry.     Findings: No rash.  Neurological:     General: No focal deficit present.     Mental Status: He is alert and oriented to person, place, and time.     Cranial Nerves: No cranial nerve deficit.     Deep Tendon Reflexes: Reflexes normal.  Psychiatric:        Mood and Affect:  Mood normal.        Behavior: Behavior normal.     BP 138/78 (BP Location: Right Arm, Patient Position: Sitting, Cuff Size: Normal)   Pulse 64   Temp 98 F (36.7 C) (Oral)   Resp 16   Ht 5\' 9"  (1.753 m)   Wt 158 lb 12.8 oz (72 kg)   SpO2 99%   BMI 23.45 kg/m  Wt Readings from Last 3 Encounters:  02/07/23 158 lb 12.8 oz (72 kg)  11/26/22 157 lb (71.2 kg)  02/26/22 160 lb (72.6 kg)    Diabetic Foot Exam - Simple   No data filed    Lab Results  Component Value Date   WBC 7.2 02/07/2023   HGB 15.1 02/07/2023   HCT 45.3 02/07/2023   PLT 221.0 02/07/2023   GLUCOSE 92 02/07/2023   CHOL 162 02/07/2023   TRIG 82.0 02/07/2023   HDL 57.30 02/07/2023   LDLCALC 88 02/07/2023   ALT 19 02/07/2023   AST 20 02/07/2023   NA 140 02/07/2023   K 4.7 02/07/2023   CL 103 02/07/2023   CREATININE 0.83 02/07/2023   BUN 20 02/07/2023   CO2 29 02/07/2023   TSH 0.76 02/07/2023   PSA 0.87 01/27/2021   HGBA1C 6.3 02/07/2023    Lab Results  Component Value Date   TSH 0.76 02/07/2023   Lab Results  Component Value Date   WBC 7.2 02/07/2023   HGB 15.1 02/07/2023   HCT 45.3 02/07/2023   MCV 92.5 02/07/2023   PLT 221.0 02/07/2023   Lab Results  Component Value Date   NA 140 02/07/2023   K 4.7 02/07/2023   CO2 29 02/07/2023   GLUCOSE 92 02/07/2023   BUN  20 02/07/2023   CREATININE 0.83 02/07/2023   BILITOT 0.6 02/07/2023   ALKPHOS 66 02/07/2023   AST 20 02/07/2023   ALT 19 02/07/2023   PROT 6.8 02/07/2023   ALBUMIN 4.3 02/07/2023   CALCIUM 9.7 02/07/2023   ANIONGAP 6 01/31/2016   GFR 82.19 02/07/2023   Lab Results  Component Value Date   CHOL 162 02/07/2023   Lab Results  Component Value Date   HDL 57.30 02/07/2023   Lab Results  Component Value Date   LDLCALC 88 02/07/2023   Lab Results  Component Value Date   TRIG 82.0 02/07/2023   Lab Results  Component Value Date   CHOLHDL 3 02/07/2023   Lab Results  Component Value Date   HGBA1C 6.3 02/07/2023        Assessment & Plan:  Preventative health care Assessment & Plan: Patient encouraged to maintain heart healthy diet, regular exercise, adequate sleep. Consider daily probiotics. Take medications as prescribed. Labs ordered and reviewed.  Colonoscopy aged out Has ACP documents in chart  Orders: -     TSH -     Lipid panel -     Comprehensive metabolic panel -     CBC with Differential/Platelet  Hyperglycemia Assessment & Plan: hgba1c acceptable, minimize simple carbs. Increase exercise as tolerated.   Orders: -     Hemoglobin A1c  Right bundle branch block -     TSH -     Lipid panel -     Comprehensive metabolic panel -     CBC with Differential/Platelet  Anemia, unspecified type Assessment & Plan: Increase leafy greens, consider increased lean red meat and using cast iron cookware. Continue to monitor, report any concerns, refill given on iron   Other orders -     Iron; Take 1 capsule by mouth daily as needed. Blood donations    Assessment and Plan    Transient Vision Loss Brief episode of bilateral blurry vision while at the airport. No associated symptoms such as flashing lights, head trauma, or changes in hearing. Possible dehydration or hypoglycemia considered as potential causes. -Encouraged to hydrate methodically, approximately 10 ounces every 1-2 hours. -Advised to consume protein with carbohydrates to mitigate rapid glucose fluctuations. -If symptoms recur, consider further evaluation with neurology and possible CT scan.  Neck Arthritis Chronic issue, possibly contributing to transient vision loss episode. No recent exacerbation requiring use of meloxicam. -Encouraged to move methodically and avoid rapid movements. -Consider use of Tizanidine muscle relaxer if neck pain worsens.  Vitamin D Supplementation Currently obtaining Vitamin D through multivitamin, which contains approximately 1000 IU. -Continue current regimen as it is within acceptable  range.  Pneumococcal Vaccination Completed Prevnar 13 and PCV 23 series approximately 10 years ago. -Consider receiving Prevnar 20 booster at pharmacy due to additional strains covered and time since last series.  General Health Maintenance -Continue Flonase as needed for allergies. -Continue regular exercise, aiming for 8000 steps per day. -Continue regular screenings with dermatologist, ophthalmologist, and dentist. -Consider annual flu and COVID boosters. -Consider resuming massage therapy or other non-invasive treatments for neck arthritis.         Danise Edge, MD

## 2023-03-21 ENCOUNTER — Telehealth: Payer: Self-pay | Admitting: Family Medicine

## 2023-03-21 NOTE — Telephone Encounter (Signed)
Copied from CRM [id number]. Topic: Medicare AWV >> Mar 21, 2023  9:42 AM Payton Doughty wrote: Reason for CRM: Called LVM 03/21/2023 to schedule AWV. Please schedule office or virtual visits.  Verlee Rossetti; Care Guide Ambulatory Clinical Support [place] [facility] Direct Dial: [phone]

## 2023-04-11 ENCOUNTER — Encounter: Payer: Self-pay | Admitting: Family

## 2023-04-11 MED ORDER — TIZANIDINE HCL 2 MG PO TABS: 2.0000 mg | ORAL_TABLET | Freq: Three times a day (TID) | ORAL | 0 refills | Status: DC | PRN

## 2023-04-17 ENCOUNTER — Other Ambulatory Visit (HOSPITAL_BASED_OUTPATIENT_CLINIC_OR_DEPARTMENT_OTHER): Payer: Self-pay

## 2023-04-17 ENCOUNTER — Ambulatory Visit (INDEPENDENT_AMBULATORY_CARE_PROVIDER_SITE_OTHER): Payer: Medicare HMO | Admitting: *Deleted

## 2023-04-17 VITALS — BP 119/66 | HR 66 | Ht 69.0 in | Wt 159.0 lb

## 2023-04-17 DIAGNOSIS — Z0000 Encounter for general adult medical examination without abnormal findings: Secondary | ICD-10-CM

## 2023-04-17 MED ORDER — PNEUMOCOCCAL 20-VAL CONJ VACC 0.5 ML IM SUSY
0.5000 mL | PREFILLED_SYRINGE | Freq: Once | INTRAMUSCULAR | 0 refills | Status: AC
  Filled 2023-04-17: qty 0.5, 1d supply, fill #0

## 2023-04-17 NOTE — Progress Notes (Signed)
 Subjective:   Bradley Peterson is a 83 y.o. male who presents for Medicare Annual/Subsequent preventive examination.  Visit Complete: In person    Cardiac Risk Factors include: male gender;advanced age (>46men, >10 women)     Objective:    Today's Vitals   04/17/23 0939 04/17/23 0941  BP: 119/66   Pulse: 66   Weight: 159 lb (72.1 kg)   Height: 5\' 9"  (1.753 m)   PainSc:  1    Body mass index is 23.48 kg/m.     04/17/2023    9:41 AM 04/16/2022    8:59 AM 04/06/2021    8:25 AM 04/01/2020    4:23 PM 01/03/2017    9:37 AM 01/31/2016   10:22 AM 12/28/2014   10:43 AM  Advanced Directives  Does Patient Have a Medical Advance Directive? Yes Yes Yes Yes Yes Yes Yes  Type of Estate agent of [place];Living will Healthcare Power of [place];Living will Healthcare Power of [place];Living will Healthcare Power of [employer] of [place];Living will Living will Healthcare Power of [place];Living will  Does patient want to make changes to medical advance directive? No - Patient declined No - Patient declined     No - Patient declined  Copy of Healthcare Power of Attorney in Chart? Yes - validated most recent copy scanned in chart (See row information) Yes - validated most recent copy scanned in chart (See row information) Yes - validated most recent copy scanned in chart (See row information) Yes - validated most recent copy scanned in chart (See row information) No - copy requested  No - copy requested    Current Medications (verified) Outpatient Encounter Medications as of 04/17/2023  Medication Sig   Chlorpheniramine Maleate (ALLERGY PO) Take by mouth. [employer] brand allergy med PRN only   Cholecalciferol (VITAMIN D3) 2000 UNITS capsule Take 2,000 Units by mouth daily.   Ferrous Sulfate (IRON) 90 (18 Fe) MG TABS Take 1 capsule by mouth daily as needed. Blood donations   fluticasone (FLONASE) 50 MCG/ACT nasal spray Place 2 sprays into both nostrils as  needed.   Multiple Vitamins-Minerals (MULTIVITAMIN WITH MINERALS) tablet Take 1 tablet by mouth daily.   tiZANidine (ZANAFLEX) 2 MG tablet Take 1 tablet (2 mg total) by mouth every 8 (eight) hours as needed for muscle spasms.   No facility-administered encounter medications on file as of 04/17/2023.    Allergies (verified) Patient has no known allergies.   History: Past Medical History:  Diagnosis Date   Allergic state 12/27/2013   Allergy    Arthritis of neck    Heart murmur    Positive TB test    Past Surgical History:  Procedure Laterality Date   COLONOSCOPY  01/2011   DENTAL SURGERY     TONSILLECTOMY     Late 1940s   UPPER GASTROINTESTINAL ENDOSCOPY  01/2011   Family History  Problem Relation Age of Onset   Heart disease Father        CABG at 59, died age 46   Arthritis Father    Arthritis Mother        Living 71   Hypertension Mother    Hyperlipidemia Mother    Stroke Mother    Colon cancer Neg Hx    Colon polyps Neg Hx    Esophageal cancer Neg Hx    Rectal cancer Neg Hx    Stomach cancer Neg Hx    Social History   Socioeconomic History   Marital status: Married  Spouse name: Not on file   Number of children: 3   Years of education: Not on file   Highest education level: Master's degree (e.g., MA, MS, MEng, MEd, MSW, MBA)  Occupational History   Not on file  Tobacco Use   Smoking status: Former    Types: Pipe    Start date: 02/19/1977   Smokeless tobacco: Never   Tobacco comments:    >25 years ago, pipe only.  Vaping Use   Vaping status: Never Used  Substance and Sexual Activity   Alcohol use: Yes    Comment: Maybe 1/2 drink a week- seldom    Drug use: No   Sexual activity: Yes    Comment: lives with wife, retired from education, Middle school principal, no major dietary restrictions  Other Topics Concern   Not on file  Social History Narrative   Retired Personal assistant.    Lives with wife   No major dietary restrictions   Social  Drivers of Corporate investment banker Strain: Low Risk  (04/17/2023)   Overall Financial Resource Strain (CARDIA)    Difficulty of Paying Living Expenses: Not hard at all  Food Insecurity: No Food Insecurity (04/17/2023)   Hunger Vital Sign    Worried About Running Out of Food in the Last Year: Never true    Ran Out of Food in the Last Year: Never true  Transportation Needs: No Transportation Needs (04/17/2023)   PRAPARE - Administrator, Civil Service (Medical): No    Lack of Transportation (Non-Medical): No  Physical Activity: Sufficiently Active (04/17/2023)   Exercise Vital Sign    Days of Exercise per Week: 6 days    Minutes of Exercise per Session: 60 min  Stress: No Stress Concern Present (04/17/2023)   [employer] of Occupational Health - Occupational Stress Questionnaire    Feeling of Stress : Only a little  Social Connections: Socially Integrated (04/17/2023)   Social Connection and Isolation Panel [NHANES]    Frequency of Communication with Friends and Family: More than three times a week    Frequency of Social Gatherings with Friends and Family: More than three times a week    Attends Religious Services: More than 4 times per year    Active Member of [employer] or Organizations: Yes    Attends Engineer, structural: More than 4 times per year    Marital Status: Married    Tobacco Counseling Counseling given: Not Answered Tobacco comments: >25 years ago, pipe only.   Clinical Intake:  Pre-visit preparation completed: Yes  Pain : 0-10 Pain Score: 1  Pain Type: Acute pain Pain Location: Neck Pain Descriptors / Indicators: Discomfort Pain Onset: In the past 7 days Pain Frequency: Several days a week  BMI - recorded: 23.48 Nutritional Status: BMI of 19-24  Normal Nutritional Risks: None Diabetes: No  How often do you need to have someone help you when you read instructions, pamphlets, or other written materials from your doctor or pharmacy?: 1  - Never  Interpreter Needed?: No  Information entered by :: Donne Anon, CMA   Activities of Daily Living    04/17/2023    9:42 AM 04/02/2023   10:32 AM  In your present state of health, do you have any difficulty performing the following activities:  Hearing? 0 1  Vision? 0 0  Difficulty concentrating or making decisions? 0 0  Walking or climbing stairs? 0 0  Dressing or bathing? 0 0  Doing errands,  shopping? 0 0  Preparing Food and eating ? N N  Using the Toilet? N N  In the past six months, have you accidently leaked urine? N N  Do you have problems with loss of bowel control? N N  Managing your Medications? N N  Managing your Finances? N N  Housekeeping or managing your Housekeeping? N N    Patient Care Team: Bradd Canary, MD as PCP - General (Family Medicine)  Indicate any recent Medical Services you may have received from other than [facility] providers in the past year (date may be approximate).     Assessment:   This is a routine wellness examination for [place].  Hearing/Vision screen No results found.   Goals Addressed   None    Depression Screen    04/17/2023    9:47 AM 02/07/2023    9:14 AM 04/16/2022    9:03 AM 02/05/2022    8:25 AM 12/07/2021   11:01 AM 11/30/2021    1:30 PM 04/06/2021    8:27 AM  PHQ 2/9 Scores  PHQ - 2 Score 0 0 0 0 0 0 0    Fall Risk    04/17/2023    9:45 AM 04/02/2023   10:32 AM 02/07/2023    9:14 AM 04/16/2022    9:01 AM 02/05/2022    8:25 AM  Fall Risk   Falls in the past year? 0 0 0 1   Comment    tripped over tree root in the woods   Number falls in past yr: 0 0 0 0 0  Injury with Fall? 0 0 0 0 0  Risk for fall due to : No Fall Risks  No Fall Risks No Fall Risks   Follow up Falls evaluation completed  Falls evaluation completed Falls evaluation completed Falls evaluation completed    MEDICARE RISK AT HOME: Medicare Risk at Home Any stairs in or around the home?: Yes If so, are there any without handrails?:  No Home free of loose throw rugs in walkways, pet beds, electrical cords, etc?: Yes Adequate lighting in your home to reduce risk of falls?: Yes Life alert?: No Use of a cane, walker or w/c?: No Grab bars in the bathroom?: Yes Shower chair or bench in shower?: Yes Elevated toilet seat or a handicapped toilet?: Yes (comfort height)  TIMED UP AND GO:  Was the test performed?  Yes  Length of time to ambulate 10 feet: 5 sec Gait steady and fast without use of assistive device    Cognitive Function:    01/03/2017    9:38 AM  MMSE - Mini Mental State Exam  Orientation to time 5  Orientation to Place 5  Registration 3  Attention/ Calculation 5  Recall 3  Language- name 2 objects 2  Language- repeat 1  Language- follow 3 step command 3  Language- read & follow direction 1  Write a sentence 1  Copy design 1  Total score 30        04/17/2023    9:48 AM 04/16/2022    9:08 AM  6CIT Screen  What Year? 0 points 0 points  What month? 0 points 0 points  What time? 0 points 0 points  Count back from 20 0 points 0 points  Months in reverse 0 points 0 points  Repeat phrase 2 points 0 points  Total Score 2 points 0 points    Immunizations Immunization History  Administered Date(s) Administered   Fluad Quad(high Dose 65+) 12/08/2021,  11/20/2022   Influenza, High Dose Seasonal PF 12/23/2012, 12/30/2015, 12/26/2016, 01/06/2018, 11/03/2019   Influenza,inj,Quad PF,6+ Mos 12/24/2013, 12/28/2014   Influenza-Unspecified 11/20/2018, 12/09/2020   PFIZER Comirnaty(Gray Top)Covid-19 Tri-Sucrose Vaccine 09/19/2020   PFIZER(Purple Top)SARS-COV-2 Vaccination 04/04/2019, 04/27/2019, 01/11/2020   Pneumococcal Conjugate-13 12/24/2013   Pneumococcal Polysaccharide-23 12/30/2015   Respiratory Syncytial Virus Vaccine,Recomb Aduvanted(Arexvy) 02/26/2022   Tdap 12/28/2014   Zoster Recombinant(Shingrix) 08/02/2017, 10/04/2017   Zoster, Live 02/20/2012    TDAP status: Up to date  Flu Vaccine  status: Up to date  Pneumococcal vaccine status: Up to date  Covid-19 vaccine status: Information provided on how to obtain vaccines.   Qualifies for Shingles Vaccine? Yes   Zostavax completed Yes   Shingrix Completed?: Yes  Screening Tests Health Maintenance  Topic Date Due   COVID-19 Vaccine (5 - 2024-25 season) 10/21/2022   Medicare Annual Wellness (AWV)  04/17/2023   DTaP/Tdap/Td (2 - Td or Tdap) 12/27/2024   Pneumonia Vaccine 66+ Years old  Completed   INFLUENZA VACCINE  Completed   Zoster Vaccines- Shingrix  Completed   HPV VACCINES  Aged Out   Hepatitis C Screening  Discontinued    Health Maintenance  Health Maintenance Due  Topic Date Due   COVID-19 Vaccine (5 - 2024-25 season) 10/21/2022   Medicare Annual Wellness (AWV)  04/17/2023    Colorectal cancer screening: No longer required.   Lung Cancer Screening: (Low Dose CT Chest recommended if Age 4-80 years, 20 pack-year currently smoking OR have quit w/in 15years.) does not qualify.   Additional Screening:  Hepatitis C Screening: does qualify; Completed 01/27/21  Vision Screening: Recommended annual ophthalmology exams for early detection of glaucoma and other disorders of the eye. Is the patient up to date with their annual eye exam?  Yes  Who is the provider or what is the name of the office in which the patient attends annual eye exams? [facility] If pt is not established with a provider, would they like to be referred to a provider to establish care? No .   Dental Screening: Recommended annual dental exams for proper oral hygiene  Diabetic Foot Exam: N/a  Community Resource Referral / Chronic Care Management: CRR required this visit?  No   CCM required this visit?  No     Plan:     I have personally reviewed and noted the following in the patient's chart:   Medical and social history Use of alcohol, tobacco or illicit drugs  Current medications and supplements including opioid  prescriptions. Patient is not currently taking opioid prescriptions. Functional ability and status Nutritional status Physical activity Advanced directives List of other physicians Hospitalizations, surgeries, and ER visits in previous 12 months Vitals Screenings to include cognitive, depression, and falls Referrals and appointments  In addition, I have reviewed and discussed with patient certain preventive protocols, quality metrics, and best practice recommendations. A written personalized care plan for preventive services as well as general preventive health recommendations were provided to patient.     Donne Anon, CMA   04/17/2023   After Visit Summary: (In Person-Declined) Patient declined AVS at this time.  Nurse Notes: None

## 2023-04-17 NOTE — Patient Instructions (Signed)
 Bradley Peterson , Thank you for taking time to come for your Medicare Wellness Visit. I appreciate your ongoing commitment to your health goals. Please review the following plan we discussed and let me know if I can assist you in the future.     This is a list of the screening recommended for you and due dates:  Health Maintenance  Topic Date Due   COVID-19 Vaccine (5 - 2024-25 season) 10/21/2022   Medicare Annual Wellness Visit  04/16/2024   DTaP/Tdap/Td vaccine (2 - Td or Tdap) 12/27/2024   Pneumonia Vaccine  Completed   Flu Shot  Completed   Zoster (Shingles) Vaccine  Completed   HPV Vaccine  Aged Out   Hepatitis C Screening  Discontinued    Next appointment: Follow up in one year for your annual wellness visit.   Preventive Care 82 Years and Older, Male Preventive care refers to lifestyle choices and visits with your health care provider that can promote health and wellness. What does preventive care include? A yearly physical exam. This is also called an annual well check. Dental exams once or twice a year. Routine eye exams. Ask your health care provider how often you should have your eyes checked. Personal lifestyle choices, including: Daily care of your teeth and gums. Regular physical activity. Eating a healthy diet. Avoiding tobacco and drug use. Limiting alcohol use. Practicing safe sex. Taking low doses of aspirin every day. Taking vitamin and mineral supplements as recommended by your health care provider. What happens during an annual well check? The services and screenings done by your health care provider during your annual well check will depend on your age, overall health, lifestyle risk factors, and family history of disease. Counseling  Your health care provider may ask you questions about your: Alcohol use. Tobacco use. Drug use. Emotional well-being. Home and relationship well-being. Sexual activity. Eating habits. History of falls. Memory and ability  to understand (cognition). Work and work Astronomer. Screening  You may have the following tests or measurements: Height, weight, and BMI. Blood pressure. Lipid and cholesterol levels. These may be checked every 5 years, or more frequently if you are over 82 years old. Skin check. Lung cancer screening. You may have this screening every year starting at age 82 if you have a 30-pack-year history of smoking and currently smoke or have quit within the past 15 years. Fecal occult blood test (FOBT) of the stool. You may have this test every year starting at age 82. Flexible sigmoidoscopy or colonoscopy. You may have a sigmoidoscopy every 5 years or a colonoscopy every 10 years starting at age 82. Prostate cancer screening. Recommendations will vary depending on your family history and other risks. Hepatitis C blood test. Hepatitis B blood test. Sexually transmitted disease (STD) testing. Diabetes screening. This is done by checking your blood sugar (glucose) after you have not eaten for a while (fasting). You may have this done every 1-3 years. Abdominal aortic aneurysm (AAA) screening. You may need this if you are a current or former smoker. Osteoporosis. You may be screened starting at age 20 if you are at high risk. Talk with your health care provider about your test results, treatment options, and if necessary, the need for more tests. Vaccines  Your health care provider may recommend certain vaccines, such as: Influenza vaccine. This is recommended every year. Tetanus, diphtheria, and acellular pertussis (Tdap, Td) vaccine. You may need a Td booster every 10 years. Zoster vaccine. You may need this after  age 82. Pneumococcal 13-valent conjugate (PCV13) vaccine. One dose is recommended after age 26. Pneumococcal polysaccharide (PPSV23) vaccine. One dose is recommended after age 50. Talk to your health care provider about which screenings and vaccines you need and how often you need  them. This information is not intended to replace advice given to you by your health care provider. Make sure you discuss any questions you have with your health care provider. Document Released: 03/04/2015 Document Revised: 10/26/2015 Document Reviewed: 12/07/2014 Elsevier Interactive Patient Education  2017 [employer].  Fall Prevention in the Home Falls can cause injuries. They can happen to people of all ages. There are many things you can do to make your home safe and to help prevent falls. What can I do on the outside of my home? Regularly fix the edges of walkways and driveways and fix any cracks. Remove anything that might make you trip as you walk through a door, such as a raised step or threshold. Trim any bushes or trees on the path to your home. Use bright outdoor lighting. Clear any walking paths of anything that might make someone trip, such as rocks or tools. Regularly check to see if handrails are loose or broken. Make sure that both sides of any steps have handrails. Any raised decks and porches should have guardrails on the edges. Have any leaves, snow, or ice cleared regularly. Use sand or salt on walking paths during winter. Clean up any spills in your garage right away. This includes oil or grease spills. What can I do in the bathroom? Use night lights. Install grab bars by the toilet and in the tub and shower. Do not use towel bars as grab bars. Use non-skid mats or decals in the tub or shower. If you need to sit down in the shower, use a plastic, non-slip stool. Keep the floor dry. Clean up any water that spills on the floor as soon as it happens. Remove soap buildup in the tub or shower regularly. Attach bath mats securely with double-sided non-slip rug tape. Do not have throw rugs and other things on the floor that can make you trip. What can I do in the bedroom? Use night lights. Make sure that you have a light by your bed that is easy to reach. Do not use  any sheets or blankets that are too big for your bed. They should not hang down onto the floor. Have a firm chair that has side arms. You can use this for support while you get dressed. Do not have throw rugs and other things on the floor that can make you trip. What can I do in the kitchen? Clean up any spills right away. Avoid walking on wet floors. Keep items that you use a lot in easy-to-reach places. If you need to reach something above you, use a strong step stool that has a grab bar. Keep electrical cords out of the way. Do not use floor polish or wax that makes floors slippery. If you must use wax, use non-skid floor wax. Do not have throw rugs and other things on the floor that can make you trip. What can I do with my stairs? Do not leave any items on the stairs. Make sure that there are handrails on both sides of the stairs and use them. Fix handrails that are broken or loose. Make sure that handrails are as long as the stairways. Check any carpeting to make sure that it is firmly attached to the stairs.  Fix any carpet that is loose or worn. Avoid having throw rugs at the top or bottom of the stairs. If you do have throw rugs, attach them to the floor with carpet tape. Make sure that you have a light switch at the top of the stairs and the bottom of the stairs. If you do not have them, ask someone to add them for you. What else can I do to help prevent falls? Wear shoes that: Do not have high heels. Have rubber bottoms. Are comfortable and fit you well. Are closed at the toe. Do not wear sandals. If you use a stepladder: Make sure that it is fully opened. Do not climb a closed stepladder. Make sure that both sides of the stepladder are locked into place. Ask someone to hold it for you, if possible. Clearly mark and make sure that you can see: Any grab bars or handrails. First and last steps. Where the edge of each step is. Use tools that help you move around (mobility aids)  if they are needed. These include: Canes. Walkers. Scooters. Crutches. Turn on the lights when you go into a dark area. Replace any light bulbs as soon as they burn out. Set up your furniture so you have a clear path. Avoid moving your furniture around. If any of your floors are uneven, fix them. If there are any pets around you, be aware of where they are. Review your medicines with your doctor. Some medicines can make you feel dizzy. This can increase your chance of falling. Ask your doctor what other things that you can do to help prevent falls. This information is not intended to replace advice given to you by your health care provider. Make sure you discuss any questions you have with your health care provider. Document Released: 12/02/2008 Document Revised: 07/14/2015 Document Reviewed: 03/12/2014 Elsevier Interactive Patient Education  2017 [employer].

## 2023-04-22 ENCOUNTER — Ambulatory Visit: Payer: Medicare HMO | Admitting: Family

## 2023-04-22 VITALS — BP 106/73 | HR 75 | Temp 97.8°F | Resp 16 | Ht 69.0 in | Wt 157.0 lb

## 2023-04-22 DIAGNOSIS — M542 Cervicalgia: Secondary | ICD-10-CM | POA: Diagnosis not present

## 2023-04-22 NOTE — Assessment & Plan Note (Signed)
  Persistent neck pain, previously unresponsive to Meloxicam but improved with Tizanidine. Patient has a history of physical therapy and continues exercises at home. Pain localized to one spot with certain movements causing shooting pain. -Continue Tizanidine as needed.  -Consider medically monitored physical therapy if pain worsens. -Consider MRI and referral to neurosurgery if physical therapy is unsuccessful. -Patient to notify provider if refill of Tizanidine is needed. -Patient considering massage therapy for muscle tightness.

## 2023-04-22 NOTE — Progress Notes (Signed)
 Subjective:     Patient ID: Bradley Peterson, male    DOB: 1940-02-28, 83 y.o.   MRN: [id number]  Chief Complaint  Patient presents with   Neck Pain    Patient reports still having neck pain     Neck Pain     Discussed the use of AI scribe software for clinical note transcription with the patient, who gave verbal consent to proceed.  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  He experiences left lower neck pain "localized to one spot", which becomes sharp with certain head movements. Previously, the pain was severe enough to restrict head movement, but it has improved significantly with tizanidine, a muscle relaxer. He still has some tizanidine left and uses it as needed when the pain flares up.  He has tried meloxicam, an anti-inflammatory, which did not provide relief.  He has seen a neurosurgeon in the remote past who recommended physical therapy exercises. He continues to do theses exercises on his own at home. He has not engaged in massage therapy for a couple of years since his previous therapist quit.  He received a pneumonia shot during his last wellness check. He is proactive about managing his health and wants to prevent his condition from worsening, as he has experienced issues with equilibrium in the past when the pain was more severe.    Health Maintenance Due  Topic Date Due   COVID-19 Vaccine (5 - 2024-25 season) 10/21/2022    Past Medical History:  Diagnosis Date   Allergic state 12/27/2013   Allergy    Arthritis of neck    Heart murmur    Positive TB test     Past Surgical History:  Procedure Laterality Date   COLONOSCOPY  01/2011   DENTAL SURGERY     TONSILLECTOMY     Late 1940s   UPPER GASTROINTESTINAL ENDOSCOPY  01/2011    Family History  Problem Relation Age of Onset   Heart disease Father        CABG at 35, died age 75   Arthritis Father    Arthritis Mother         Living 43   Hypertension Mother    Hyperlipidemia Mother    Stroke Mother    Colon cancer Neg Hx    Colon polyps Neg Hx    Esophageal cancer Neg Hx    Rectal cancer Neg Hx    Stomach cancer Neg Hx     Social History   Socioeconomic History   Marital status: Married    Spouse name: Not on file   Number of children: 3   Years of education: Not on file   Highest education level: Master's degree (e.g., MA, MS, MEng, MEd, MSW, MBA)  Occupational History   Not on file  Tobacco Use   Smoking status: Former    Types: Pipe    Start date: 02/19/1977   Smokeless tobacco: Never   Tobacco comments:    >25 years ago, pipe only.  Vaping Use   Vaping status: Never Used  Substance and Sexual Activity   Alcohol use: Yes    Comment: Maybe 1/2 drink a week- seldom    Drug use: No   Sexual activity: Yes    Comment: lives with wife, retired from education, Middle school principal, no major dietary restrictions  Other Topics Concern   Not on file  Social  History Narrative   Retired Personal assistant.    Lives with wife   No major dietary restrictions   Social Drivers of Corporate investment banker Strain: Low Risk  (04/17/2023)   Overall Financial Resource Strain (CARDIA)    Difficulty of Paying Living Expenses: Not hard at all  Food Insecurity: No Food Insecurity (04/17/2023)   Hunger Vital Sign    Worried About Running Out of Food in the Last Year: Never true    Ran Out of Food in the Last Year: Never true  Transportation Needs: No Transportation Needs (04/17/2023)   PRAPARE - Administrator, Civil Service (Medical): No    Lack of Transportation (Non-Medical): No  Physical Activity: Sufficiently Active (04/17/2023)   Exercise Vital Sign    Days of Exercise per Week: 6 days    Minutes of Exercise per Session: 60 min  Stress: No Stress Concern Present (04/17/2023)   [employer] of Occupational Health - Occupational Stress Questionnaire    Feeling of Stress :  Only a little  Social Connections: Socially Integrated (04/17/2023)   Social Connection and Isolation Panel [NHANES]    Frequency of Communication with Friends and Family: More than three times a week    Frequency of Social Gatherings with Friends and Family: More than three times a week    Attends Religious Services: More than 4 times per year    Active Member of [employer] or Organizations: Yes    Attends Engineer, structural: More than 4 times per year    Marital Status: Married  Catering manager Violence: Not At Risk (04/17/2023)   Humiliation, Afraid, Rape, and Kick questionnaire    Fear of Current or Ex-Partner: No    Emotionally Abused: No    Physically Abused: No    Sexually Abused: No    Outpatient Medications Prior to Visit  Medication Sig Dispense Refill   Chlorpheniramine Maleate (ALLERGY PO) Take by mouth. [employer] brand allergy med PRN only     Cholecalciferol (VITAMIN D3) 2000 UNITS capsule Take 2,000 Units by mouth daily.     Ferrous Sulfate (IRON) 90 (18 Fe) MG TABS Take 1 capsule by mouth daily as needed. Blood donations     fluticasone (FLONASE) 50 MCG/ACT nasal spray Place 2 sprays into both nostrils as needed. 16 g 3   Multiple Vitamins-Minerals (MULTIVITAMIN WITH MINERALS) tablet Take 1 tablet by mouth daily.     tiZANidine (ZANAFLEX) 2 MG tablet Take 1 tablet (2 mg total) by mouth every 8 (eight) hours as needed for muscle spasms. 30 tablet 0   No facility-administered medications prior to visit.    No Known Allergies  Review of Systems  Musculoskeletal:  Positive for neck pain.      See HPI Objective:    Physical Exam Constitutional:      General: He is not in acute distress.    Appearance: He is well-developed.  HENT:     Head: Normocephalic and atraumatic.  Cardiovascular:     Rate and Rhythm: Normal rate and regular rhythm.     Heart sounds: No murmur heard. Pulmonary:     Effort: Pulmonary effort is normal. No respiratory distress.      Breath sounds: Normal breath sounds. No wheezing or rales.  Skin:    General: Skin is warm and dry.  Neurological:     Mental Status: He is alert and oriented to person, place, and time.  Psychiatric:  Behavior: Behavior normal.        Thought Content: Thought content normal.      BP 106/73 (BP Location: Right Arm, Patient Position: Sitting, Cuff Size: Small)   Pulse 75   Temp 97.8 F (36.6 C) (Oral)   Resp 16   Ht 5\' 9"  (1.753 m)   Wt 157 lb (71.2 kg)   SpO2 100%   BMI 23.18 kg/m  Wt Readings from Last 3 Encounters:  04/22/23 157 lb (71.2 kg)  04/17/23 159 lb (72.1 kg)  02/07/23 158 lb 12.8 oz (72 kg)       Assessment & Plan:   Problem List Items Addressed This Visit       Unprioritized   Neck pain - Primary    Persistent neck pain, previously unresponsive to Meloxicam but improved with Tizanidine. Patient has a history of physical therapy and continues exercises at home. Pain localized to one spot with certain movements causing shooting pain. -Continue Tizanidine as needed.  -Consider medically monitored physical therapy if pain worsens. -Consider MRI and referral to neurosurgery if physical therapy is unsuccessful. -Patient to notify provider if refill of Tizanidine is needed. -Patient considering massage therapy for muscle tightness.        I am having Kyra Manges "[name]" maintain his Vitamin D3, fluticasone, Chlorpheniramine Maleate (ALLERGY PO), multivitamin with minerals, Iron, and tiZANidine.  No orders of the defined types were placed in this encounter.

## 2023-04-22 NOTE — Patient Instructions (Signed)
 VISIT SUMMARY:  -CERVICAL ARTHRITIS: Cervical arthritis is a condition where the joints in the neck become inflamed, causing pain and stiffness. Bradley Peterson's neck pain has improved with the use of tizanidine, a muscle relaxer, and he should continue using it as needed. If the pain worsens, he may need medically monitored physical therapy. If physical therapy does not help, an MRI and a referral to a neurosurgeon may be necessary. Bradley Peterson should notify his provider if he needs a refill of tizanidine and is considering massage therapy for muscle tightness.  -GENERAL HEALTH MAINTENANCE: Bradley Peterson received a pneumonia vaccine at his last wellness check, which helps protect against pneumonia, a serious lung infection.  INSTRUCTIONS:  Please continue taking tizanidine as needed for your neck pain. If your pain worsens, consider medically monitored physical therapy. If physical therapy does not help, we may need to do an MRI and refer you to a neurosurgeon. Notify us if you need a refill of tizanidine. You may also consider massage therapy for muscle tightness.

## 2023-09-05 ENCOUNTER — Ambulatory Visit: Payer: Self-pay

## 2023-09-05 NOTE — Telephone Encounter (Signed)
 FYI Only or Action Required?: FYI only for provider.  Patient was last seen in primary care on 04/22/2023 by Daryl Setter, NP.  Called Nurse Triage reporting Weight Loss.  Symptoms began several months ago.  Interventions attempted: Nothing.  Symptoms are: stable.  Triage Disposition: See PCP When Office is Open (Within 3 Days)  Patient/caregiver understands and will follow disposition?: Yes  Pt concerned about having weight loss and other sx, has lost 10 lbs in 3 months. States still eating and drinking normally. Including protein with meals and snacks at times. Denies N/V/D. Also has fatigue and stability concerns at times. Scheduled appt with Manuelita, [place] on 09/10/23 d/t no appts with PCP until 12/2023.   Copied from CRM [id number]. Topic: Clinical - Red Word Triage >> Sep 05, 2023 10:56 AM Willma R wrote: [facility] that prompted transfer to Nurse Triage: Patient states over the last 6 month has been steadily losing weight about 10 lbs and over the last couple of months has been more fatigue and a loss of balance. Reason for Disposition  MODERATE unexplained weight loss (e.g., 5%, 8 to 10 pounds [4 - 4.5 kg] in person who weighs 170 to 200 pounds [75 - 90 kg])  Answer Assessment - Initial Assessment Questions 1. MAIN CONCERN: What is your main concern today?     Weight loss x 3 months  2. WEIGHT LOSS: How much weight have you lost?  (e.g., lbs., kgs.)  Over what period of time have you lost this weight?  (e.g., number of days, weeks, months, years)     10 lbs  3. BASELINE WEIGHT: What is your baseline or normal weight? (e.g., How much do you usually weigh?)     165lbs at LOV  7. OTHER SYMPTOMS: Do you have any other symptoms? (e.g., anxiety or depression, blood in stool, breathing difficulty, diarrhea, fever, trouble swallowing)     Fatigue and loss of balance at times   Still having good appetite and increased fluid intake  Denies N/V/D  Protocols used: Weight  Loss - Unintended-A-AH

## 2023-09-10 ENCOUNTER — Ambulatory Visit: Admitting: Physician Assistant

## 2023-09-10 ENCOUNTER — Encounter: Payer: Self-pay | Admitting: Physician Assistant

## 2023-09-10 ENCOUNTER — Ambulatory Visit: Payer: Self-pay | Admitting: Physician Assistant

## 2023-09-10 VITALS — BP 131/75 | HR 56 | Ht 69.0 in | Wt 152.0 lb

## 2023-09-10 DIAGNOSIS — R634 Abnormal weight loss: Secondary | ICD-10-CM

## 2023-09-10 DIAGNOSIS — R2681 Unsteadiness on feet: Secondary | ICD-10-CM | POA: Diagnosis not present

## 2023-09-10 LAB — CBC WITH DIFFERENTIAL/PLATELET
Basophils Absolute: 0 K/uL (ref 0.0–0.1)
Basophils Relative: 0.6 % (ref 0.0–3.0)
Eosinophils Absolute: 0.1 K/uL (ref 0.0–0.7)
Eosinophils Relative: 1.6 % (ref 0.0–5.0)
HCT: 45.1 % (ref 39.0–52.0)
Hemoglobin: 15.1 g/dL (ref 13.0–17.0)
Lymphocytes Relative: 17.5 % (ref 12.0–46.0)
Lymphs Abs: 1.2 K/uL (ref 0.7–4.0)
MCHC: 33.5 g/dL (ref 30.0–36.0)
MCV: 92.5 fl (ref 78.0–100.0)
Monocytes Absolute: 0.5 K/uL (ref 0.1–1.0)
Monocytes Relative: 7.2 % (ref 3.0–12.0)
Neutro Abs: 5.1 K/uL (ref 1.4–7.7)
Neutrophils Relative %: 73.1 % (ref 43.0–77.0)
Platelets: 219 K/uL (ref 150.0–400.0)
RBC: 4.87 Mil/uL (ref 4.22–5.81)
RDW: 13.6 % (ref 11.5–15.5)
WBC: 6.9 K/uL (ref 4.0–10.5)

## 2023-09-10 LAB — COMPREHENSIVE METABOLIC PANEL WITH GFR
ALT: 18 U/L (ref 0–53)
AST: 21 U/L (ref 0–37)
Albumin: 4.5 g/dL (ref 3.5–5.2)
Alkaline Phosphatase: 68 U/L (ref 39–117)
BUN: 13 mg/dL (ref 6–23)
CO2: 30 meq/L (ref 19–32)
Calcium: 9.7 mg/dL (ref 8.4–10.5)
Chloride: 103 meq/L (ref 96–112)
Creatinine, Ser: 0.7 mg/dL (ref 0.40–1.50)
GFR: 86.17 mL/min (ref >60.00)
Glucose, Bld: 93 mg/dL (ref 70–99)
Potassium: 4.4 meq/L (ref 3.5–5.1)
Sodium: 140 meq/L (ref 135–145)
Total Bilirubin: 0.7 mg/dL (ref 0.2–1.2)
Total Protein: 7 g/dL (ref 6.0–8.3)

## 2023-09-10 LAB — TSH: TSH: 0.77 u[IU]/mL (ref 0.35–5.50)

## 2023-09-10 LAB — HEMOGLOBIN A1C: Hgb A1c MFr Bld: 6.2 % (ref 4.6–6.5)

## 2023-09-10 NOTE — Progress Notes (Signed)
 Established patient visit   Patient: Bradley Peterson   DOB: 04-02-1940   82 y.o. Male  MRN: [id number] Visit Date: 09/10/2023  Today's healthcare provider: Manuelita Flatness, PA-C   Chief Complaint  Patient presents with   Weight Loss   Subjective     Discussed the use of AI scribe software for clinical note transcription with the patient, who gave verbal consent to proceed.  History of Present Illness   Bradley Peterson is an 83 year old male who presents with unintentional weight loss and unsteadiness.  Pt reports a perceived weight loss of 10 lbs since the beginning of the year. He is concerned. Denies change in bowel/bladder habits, denies blood in stool, abdominal pain. Denies chest pain, palpitations.  He experiences a sensation of unsteadiness, feeling 'shaky' and 'not steady sometimes,' without dizziness or vertigo. This occurs occasionally and is not severe. He walks for an hour daily, though recent weather has limited this activity. No leg weakness is present. Denies LE paresthesias.    Medications: Outpatient Medications Prior to Visit  Medication Sig   Chlorpheniramine Maleate (ALLERGY PO) Take by mouth. [employer] brand allergy med PRN only   Cholecalciferol (VITAMIN D3) 2000 UNITS capsule Take 2,000 Units by mouth daily.   Ferrous Sulfate (IRON ) 90 (18 Fe) MG TABS Take 1 capsule by mouth daily as needed. Blood donations   fluticasone  (FLONASE ) 50 MCG/ACT nasal spray Place 2 sprays into both nostrils as needed.   Multiple Vitamins-Minerals (MULTIVITAMIN WITH MINERALS) tablet Take 1 tablet by mouth daily.   [DISCONTINUED] tiZANidine  (ZANAFLEX ) 2 MG tablet Take 1 tablet (2 mg total) by mouth every 8 (eight) hours as needed for muscle spasms.   No facility-administered medications prior to visit.    Review of Systems  Constitutional:  Positive for unexpected weight change. Negative for fatigue and fever.  Respiratory:  Negative for cough and shortness of  breath.   Cardiovascular:  Negative for chest pain, palpitations and leg swelling.  Neurological:  Negative for dizziness and headaches.       Objective    BP 131/75   Pulse (!) 56   Ht 5' 9 (1.753 m)   Wt 152 lb (68.9 kg)   BMI 22.45 kg/m    Physical Exam Constitutional:      General: He is awake.     Appearance: He is well-developed.  HENT:     Head: Normocephalic.  Eyes:     Conjunctiva/sclera: Conjunctivae normal.  Cardiovascular:     Rate and Rhythm: Normal rate and regular rhythm.     Pulses: Normal pulses.     Heart sounds: Normal heart sounds.  Pulmonary:     Effort: Pulmonary effort is normal.     Breath sounds: Normal breath sounds.  Musculoskeletal:     Right lower leg: No edema.     Left lower leg: No edema.  Skin:    General: Skin is warm.  Neurological:     Mental Status: He is alert and oriented to person, place, and time.  Psychiatric:        Attention and Perception: Attention normal.        Mood and Affect: Mood normal.        Speech: Speech normal.        Behavior: Behavior is cooperative.      No results found for any visits on 09/10/23.  Assessment & Plan    Weight loss Wt Readings from Last 3 Encounters:  09/10/23 152 lb (68.9 kg)  04/22/23 157 lb (71.2 kg)  04/17/23 159 lb (72.1 kg)   Will check labs, but reassured no significant or concerning weight change especially with the lack of other symptoms.  -     CBC with Differential/Platelet -     Comprehensive metabolic panel with GFR -     Hemoglobin A1c -     TSH  Unsteadiness - Encourage daily activities that challenge balance and proprioception, such as bending and reaching exercises  Return if symptoms worsen or fail to improve.       Manuelita Flatness, PA-C  [facility] [phone] (phone) [phone] (fax)  [facility]

## 2023-09-20 ENCOUNTER — Encounter (HOSPITAL_BASED_OUTPATIENT_CLINIC_OR_DEPARTMENT_OTHER): Payer: Self-pay

## 2023-09-20 ENCOUNTER — Ambulatory Visit (HOSPITAL_BASED_OUTPATIENT_CLINIC_OR_DEPARTMENT_OTHER)
Admission: EM | Admit: 2023-09-20 | Discharge: 2023-09-20 | Disposition: A | Discharge status: Home / Self Care | Attending: Family Medicine | Admitting: Family Medicine

## 2023-09-20 ENCOUNTER — Other Ambulatory Visit (HOSPITAL_BASED_OUTPATIENT_CLINIC_OR_DEPARTMENT_OTHER): Payer: Self-pay

## 2023-09-20 DIAGNOSIS — R21 Rash and other nonspecific skin eruption: Secondary | ICD-10-CM

## 2023-09-20 MED ORDER — PREDNISONE 10 MG (21) PO TBPK
ORAL_TABLET | ORAL | 0 refills | Status: AC
  Filled 2023-09-20: qty 21, 6d supply, fill #0

## 2023-09-20 NOTE — ED Provider Notes (Signed)
 Bradley Peterson    CSN: [id number] Arrival date & time: 09/20/23  0900      History   Chief Complaint Chief Complaint  Patient presents with   Rash    HPI Bradley Peterson is a 83 y.o. male.   Patient is a 83 year old male who presents today with rash.  Possible poison ivy exposure yesterday. States doing yard work yesterday. Noticed rash develop to left forearm. Small amount of itching.  Has been using anti-itch cream with some relief.  No fevers or chills.   Rash   Past Medical History:  Diagnosis Date   Allergic state 12/27/2013   Allergy    Arthritis of neck    Heart murmur    Positive TB test     Patient Active Problem List   Diagnosis Date Noted   Allergic contact dermatitis due to plants, except food 11/26/2022   Hyperglycemia 02/04/2022   Shoulder effusion, right 01/30/2022   Neck pain 07/11/2020   H/O colonoscopy with polypectomy 01/11/2020   OA (osteoarthritis) of knee 06/19/2017   Preventative health Peterson 01/06/2017   Right bundle branch block 03/13/2016   Medicare annual wellness visit, subsequent 12/27/2013   Allergy 12/27/2013   Abnormal echocardiogram 01/07/2013   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 Authorizing Provider  predniSONE  (STERAPRED UNI-PAK 21 TAB) 10 MG (21) TBPK tablet Take by mouth as dosed on pack 09/20/23  Yes Elva Mauro A, FNP  Chlorpheniramine Maleate (ALLERGY PO) Take by mouth. [employer] brand allergy med PRN only    [provider]  Cholecalciferol (VITAMIN D3) 2000 UNITS capsule Take 2,000 Units  by mouth daily.    [provider]  Ferrous Sulfate (IRON ) 90 (18 Fe) MG TABS Take 1 capsule by mouth daily as needed. Blood donations 02/07/23   Domenica Harlene LABOR, MD  fluticasone  (FLONASE ) 50 MCG/ACT nasal spray Place 2 sprays into both nostrils as needed. 01/11/20   Domenica Harlene LABOR, MD  Multiple Vitamins-Minerals (MULTIVITAMIN WITH MINERALS) tablet Take 1 tablet by mouth daily.    [provider]    Family History Family History  Problem Relation Age of Onset   Arthritis Mother        Living 52   Hypertension Mother    Hyperlipidemia Mother    Stroke Mother    Heart disease Father        CABG at 70, died age 69   Arthritis Father    Heart attack Father    Atrial fibrillation Brother    Heart disease Brother    Colon cancer Neg Hx    Colon polyps Neg Hx    Esophageal cancer Neg Hx    Rectal cancer Neg Hx    Stomach cancer Neg Hx     Social History Social History   Tobacco Use   Smoking status: Former    Types: Pipe    Start date: 02/19/1977   Smokeless tobacco: Never   Tobacco comments:    >25 years ago, pipe only.  Vaping Use   Vaping status: Never Used  Substance Use Topics   Alcohol use: Yes    Comment: Maybe 1/2 drink a week- seldom    Drug use: No     Allergies   Patient has no known allergies.   Review of Systems Review of Systems  Skin:  Positive for rash.   See HPI  Physical Exam Triage Vital Signs ED Triage Vitals  Encounter Vitals Group     BP 09/20/23 0911 122/73     Girls Systolic BP Percentile --      Girls Diastolic BP Percentile --      Boys Systolic BP Percentile --      Boys Diastolic BP Percentile --      Pulse Rate 09/20/23 0911 63     Resp 09/20/23 0911 20     Temp 09/20/23 0911 98.2 F (36.8 C)     Temp Source 09/20/23 0911 Oral     SpO2 09/20/23 0911 95 %     Weight --      Height --      Head Circumference --      Peak Flow --      Pain Score 09/20/23 0913 0     Pain Loc --      Pain Education --       Exclude from Growth Chart --    No data found.  Updated Vital Signs BP 122/73 (BP Location: Right Arm)   Pulse 63   Temp 98.2 F (36.8 C) (Oral)   Resp 20   SpO2 95%   Visual Acuity Right Eye Distance:   Left Eye Distance:   Bilateral Distance:    Right Eye Near:   Left Eye Near:    Bilateral Near:     Physical Exam Constitutional:      Appearance: Normal appearance.  Pulmonary:     Effort: Pulmonary effort is normal.  Musculoskeletal:        General: Normal range of motion.  Skin:    Findings: Rash present.     Comments: Papular erythematous rash to left wrist extending up forearm into upper arm.  Neurological:     Mental Status: He is alert.  Psychiatric:        Mood and Affect: Mood normal.      UC Treatments / Results  Labs (all labs ordered are listed, but only abnormal results are displayed) Labs Reviewed - No data to display  EKG   Radiology No results found.  Procedures Procedures (including critical Peterson time)  Medications Ordered in UC Medications - No data to display  Initial Impression / Assessment and Plan / UC Course  I have reviewed the triage vital signs and the nursing notes.  Pertinent labs & imaging results that were available during my Peterson of the patient were reviewed by me and considered in my medical decision making (see chart for details).     Rash-most likely poison ivy dermatitis.  Prescribing prednisone  taper to use over the next 6 days.  Recommend take with food.  Can do over-the-counter Zyrtec as needed.  Follow-up as needed Final Clinical Impressions(s) / UC Diagnoses   Final diagnoses:  Rash and nonspecific skin eruption     Discharge Instructions      Prednisone  as prescribed.  Take with food.  Take early in the day You can do Zyrtec over-the-counter for itching.  Follow-up as needed   ED Prescriptions     Medication Sig Dispense Auth. Provider  predniSONE  (STERAPRED UNI-PAK 21 TAB) 10 MG (21) TBPK  tablet Take by mouth as dosed on pack 21 each Koralyn Prestage A, FNP      PDMP not reviewed this encounter.   Adah Wilbert LABOR, FNP 09/20/23 1110

## 2023-09-20 NOTE — ED Triage Notes (Signed)
 Possible poison ivy exposure yesterday. States doing yard work yesterday. Noticed rash develop to left forearm. Small amount of itching.

## 2023-09-20 NOTE — Discharge Instructions (Signed)
 Prednisone  as prescribed.  Take with food.  Take early in the day You can do Zyrtec over-the-counter for itching.  Follow-up as needed

## 2024-02-09 NOTE — Progress Notes (Unsigned)
 "  Subjective:    Patient ID: Bradley Peterson, male    DOB: [birth year], 83 y.o.   MRN: [id number]  No chief complaint on file.   HPI Discussed the use of AI scribe software for clinical note transcription with the patient, who gave verbal consent to proceed.  History of Present Illness     Past Medical History:  Diagnosis Date   Allergic state 12/27/2013   Allergy    Arthritis of neck    Heart murmur    Positive TB test     Past Surgical History:  Procedure Laterality Date   COLONOSCOPY  01/2011   DENTAL SURGERY     TONSILLECTOMY     Late 1940s   UPPER GASTROINTESTINAL ENDOSCOPY  01/2011    Family History  Problem Relation Age of Onset   Arthritis Mother        Living 25   Hypertension Mother    Hyperlipidemia Mother    Stroke Mother    Heart disease Father        CABG at 38, died age 20   Arthritis Father    Heart attack Father    Atrial fibrillation Brother    Heart disease Brother    Colon cancer Neg Hx    Colon polyps Neg Hx    Esophageal cancer Neg Hx    Rectal cancer Neg Hx    Stomach cancer Neg Hx     Social History   Socioeconomic History   Marital status: Married    Spouse name: Not on file   Number of children: 3   Years of education: Not on file   Highest education level: Master's degree (e.g., MA, MS, MEng, MEd, MSW, MBA)  Occupational History   Not on file  Tobacco Use   Smoking status: Former    Types: Pipe    Start date: 02/19/1977   Smokeless tobacco: Never   Tobacco comments:    >25 years ago, pipe only.  Vaping Use   Vaping status: Never Used  Substance and Sexual Activity   Alcohol use: Yes    Comment: Maybe 1/2 drink a week- seldom    Drug use: No   Sexual activity: Yes    Comment: lives with wife, retired from education, Middle school principal, no major dietary restrictions  Other Topics Concern   Not on file  Social History Narrative   Retired personal assistant.    Lives with  wife   No major dietary restrictions   Social Drivers of Health   Tobacco Use: Medium Risk (09/20/2023)   Patient History    Smoking Tobacco Use: Former    Smokeless Tobacco Use: Never    Passive Exposure: Not on file  Financial Resource Strain: Low Risk (02/06/2024)   Overall Financial Resource Strain (CARDIA)    Difficulty of Paying Living Expenses: Not hard at all  Food Insecurity: No Food Insecurity (02/06/2024)   Epic    Worried About Programme Researcher, Broadcasting/film/video in the Last Year: Never true    Ran Out of Food in the Last Year: Never true  Transportation Needs: No Transportation Needs (02/06/2024)   Epic    Lack of Transportation (Medical): No    Lack of Transportation (Non-Medical): No  Physical Activity: Sufficiently Active (02/06/2024)   Exercise Vital Sign    Days of Exercise per Week: 5 days    Minutes of Exercise per Session: 60 min  Stress: No Stress Concern Present (02/06/2024)   [employer] of  Occupational Health - Occupational Stress Questionnaire    Feeling of Stress: Only a little  Social Connections: Socially Integrated (02/06/2024)   Social Connection and Isolation Panel    Frequency of Communication with Friends and Family: More than three times a week    Frequency of Social Gatherings with Friends and Family: Twice a week    Attends Religious Services: More than 4 times per year    Active Member of [employer] or Organizations: Yes    Attends Engineer, Structural: More than 4 times per year    Marital Status: Married  Catering Manager Violence: Not At Risk (04/17/2023)   Humiliation, Afraid, Rape, and Kick questionnaire    Fear of Current or Ex-Partner: No    Emotionally Abused: No    Physically Abused: No    Sexually Abused: No  Depression (PHQ2-9): Low Risk (04/17/2023)   Depression (PHQ2-9)    PHQ-2 Score: 0  Alcohol Screen: Low Risk (02/06/2024)   Alcohol Screen    Last Alcohol Screening Score (AUDIT): 1  Housing: Low Risk  (02/06/2024)   Epic    Unable to Pay for Housing in the Last Year: No    Number of Times Moved in the Last Year: 0    Homeless in the Last Year: No  Utilities: Not At Risk (04/17/2023)   [facility] Utilities    Threatened with loss of utilities: No  Health Literacy: Adequate Health Literacy (04/17/2023)   B1300 Health Literacy    Frequency of need for help with medical instructions: Never    Outpatient Medications Prior to Visit  Medication Sig Dispense Refill   Chlorpheniramine Maleate (ALLERGY PO) Take by mouth. [employer] brand allergy med PRN only     Cholecalciferol (VITAMIN D3) 2000 UNITS capsule Take 2,000 Units by mouth daily.     Ferrous Sulfate (IRON ) 90 (18 Fe) MG TABS Take 1 capsule by mouth daily as needed. Blood donations     fluticasone  (FLONASE ) 50 MCG/ACT nasal spray Place 2 sprays into both nostrils as needed. 16 g 3   Multiple Vitamins-Minerals (MULTIVITAMIN WITH MINERALS) tablet Take 1 tablet by mouth daily.     predniSONE  (STERAPRED UNI-PAK 21 TAB) 10 MG (21) TBPK tablet Take by mouth as dosed on pack 21 each 0   No facility-administered medications prior to visit.    Allergies[1]  Review of Systems  Constitutional:  Negative for chills, fever and malaise/fatigue.  HENT:  Negative for congestion and hearing loss.   Eyes:  Negative for discharge.  Respiratory:  Negative for cough, sputum production and shortness of breath.   Cardiovascular:  Negative for chest pain, palpitations and leg swelling.  Gastrointestinal:  Negative for abdominal pain, blood in stool, constipation, diarrhea, heartburn, nausea and vomiting.  Genitourinary:  Negative for dysuria, frequency, hematuria and urgency.  Musculoskeletal:  Negative for back pain, falls and myalgias.  Skin:  Negative for rash.  Neurological:  Negative for dizziness, sensory change, loss of consciousness, weakness and headaches.  Endo/Heme/Allergies:  Negative for environmental allergies. Does not bruise/bleed  easily.  Psychiatric/Behavioral:  Negative for depression and suicidal ideas. The patient is not nervous/anxious and does not have insomnia.        Objective:    Physical Exam Vitals reviewed.  Constitutional:      General: He is not in acute distress.    Appearance: Normal appearance. He is not ill-appearing or diaphoretic.  HENT:     Head: Normocephalic and atraumatic.     Right Ear: Tympanic membrane,  ear canal and external ear normal. There is no impacted cerumen.     Left Ear: Tympanic membrane, ear canal and external ear normal. There is no impacted cerumen.     Nose: Nose normal. No rhinorrhea.     Mouth/Throat:     Pharynx: Oropharynx is clear.  Eyes:     General: No scleral icterus.    Extraocular Movements: Extraocular movements intact.     Conjunctiva/sclera: Conjunctivae normal.     Pupils: Pupils are equal, round, and reactive to light.  Neck:     Thyroid : No thyroid  mass or thyroid  tenderness.  Cardiovascular:     Rate and Rhythm: Normal rate and regular rhythm.     Pulses: Normal pulses.     Heart sounds: Normal heart sounds. No murmur heard. Pulmonary:     Effort: Pulmonary effort is normal.     Breath sounds: Normal breath sounds. No wheezing.  Abdominal:     General: Bowel sounds are normal.     Palpations: Abdomen is soft. There is no mass.     Tenderness: There is no guarding.  Musculoskeletal:        General: No swelling. Normal range of motion.     Cervical back: Normal range of motion and neck supple. No rigidity.     Right lower leg: No edema.     Left lower leg: No edema.  Lymphadenopathy:     Cervical: No cervical adenopathy.  Skin:    General: Skin is warm and dry.     Findings: No rash.  Neurological:     General: No focal deficit present.     Mental Status: He is alert and oriented to person, place, and time.     Cranial Nerves: No cranial nerve deficit.     Deep Tendon Reflexes: Reflexes normal.  Psychiatric:        Mood and Affect:  Mood normal.        Behavior: Behavior normal.    There were no vitals taken for this visit. Wt Readings from Last 3 Encounters:  09/10/23 152 lb (68.9 kg)  04/22/23 157 lb (71.2 kg)  04/17/23 159 lb (72.1 kg)    Diabetic Foot Exam - Simple   No data filed    Lab Results  Component Value Date   WBC 6.9 09/10/2023   HGB 15.1 09/10/2023   HCT 45.1 09/10/2023   PLT 219.0 09/10/2023   GLUCOSE 93 09/10/2023   CHOL 162 02/07/2023   TRIG 82.0 02/07/2023   HDL 57.30 02/07/2023   LDLCALC 88 02/07/2023   ALT 18 09/10/2023   AST 21 09/10/2023   NA 140 09/10/2023   K 4.4 09/10/2023   CL 103 09/10/2023   CREATININE 0.70 09/10/2023   BUN 13 09/10/2023   CO2 30 09/10/2023   TSH 0.77 09/10/2023   PSA 0.87 01/27/2021   HGBA1C 6.2 09/10/2023    Lab Results  Component Value Date   TSH 0.77 09/10/2023   Lab Results  Component Value Date   WBC 6.9 09/10/2023   HGB 15.1 09/10/2023   HCT 45.1 09/10/2023   MCV 92.5 09/10/2023   PLT 219.0 09/10/2023   Lab Results  Component Value Date   NA 140 09/10/2023   K 4.4 09/10/2023   CO2 30 09/10/2023   GLUCOSE 93 09/10/2023   BUN 13 09/10/2023   CREATININE 0.70 09/10/2023   BILITOT 0.7 09/10/2023   ALKPHOS 68 09/10/2023   AST 21 09/10/2023   ALT 18 09/10/2023  PROT 7.0 09/10/2023   ALBUMIN 4.5 09/10/2023   CALCIUM 9.7 09/10/2023   ANIONGAP 6 01/31/2016   GFR 86.17 09/10/2023   Lab Results  Component Value Date   CHOL 162 02/07/2023   Lab Results  Component Value Date   HDL 57.30 02/07/2023   Lab Results  Component Value Date   LDLCALC 88 02/07/2023   Lab Results  Component Value Date   TRIG 82.0 02/07/2023   Lab Results  Component Value Date   CHOLHDL 3 02/07/2023   Lab Results  Component Value Date   HGBA1C 6.2 09/10/2023       Assessment & Plan:  Hyperglycemia Assessment & Plan: hgba1c acceptable, minimize simple carbs. Increase exercise as tolerated.    Preventative health care Assessment &  Plan: Patient encouraged to maintain heart healthy diet, regular exercise, adequate sleep. Consider daily probiotics. Take medications as prescribed. Labs ordered and reviewed.  Colonoscopy aged out Has ACP documents in chart     Assessment and Plan Assessment & Plan      Harlene Horton, MD     [1] No Known Allergies "

## 2024-02-09 NOTE — Assessment & Plan Note (Signed)
 Patient encouraged to maintain heart healthy diet, regular exercise, adequate sleep. Consider daily probiotics. Take medications as prescribed. Labs ordered and reviewed.  Colonoscopy aged out Has ACP documents in chart

## 2024-02-09 NOTE — Assessment & Plan Note (Signed)
 hgba1c acceptable, minimize simple carbs. Increase exercise as tolerated.

## 2024-02-10 ENCOUNTER — Ambulatory Visit: Payer: Medicare HMO | Admitting: Family Medicine

## 2024-02-10 ENCOUNTER — Encounter: Payer: Self-pay | Admitting: Family Medicine

## 2024-02-10 VITALS — BP 122/78 | HR 60 | Temp 97.7°F | Resp 16 | Ht 69.0 in | Wt 156.6 lb

## 2024-02-10 DIAGNOSIS — R0982 Postnasal drip: Secondary | ICD-10-CM

## 2024-02-10 DIAGNOSIS — Z0000 Encounter for general adult medical examination without abnormal findings: Secondary | ICD-10-CM

## 2024-02-10 DIAGNOSIS — T7840XD Allergy, unspecified, subsequent encounter: Secondary | ICD-10-CM | POA: Diagnosis not present

## 2024-02-10 DIAGNOSIS — R0981 Nasal congestion: Secondary | ICD-10-CM

## 2024-02-10 DIAGNOSIS — M47812 Spondylosis without myelopathy or radiculopathy, cervical region: Secondary | ICD-10-CM | POA: Diagnosis not present

## 2024-02-10 DIAGNOSIS — R739 Hyperglycemia, unspecified: Secondary | ICD-10-CM | POA: Diagnosis not present

## 2024-02-10 DIAGNOSIS — R42 Dizziness and giddiness: Secondary | ICD-10-CM

## 2024-02-10 DIAGNOSIS — M542 Cervicalgia: Secondary | ICD-10-CM | POA: Diagnosis not present

## 2024-02-10 DIAGNOSIS — R531 Weakness: Secondary | ICD-10-CM | POA: Diagnosis not present

## 2024-02-10 LAB — LIPID PANEL
Cholesterol: 170 mg/dL (ref 28–200)
HDL: 62.4 mg/dL
LDL Cholesterol: 91 mg/dL (ref 10–99)
NonHDL: 107.98
Total CHOL/HDL Ratio: 3
Triglycerides: 83 mg/dL (ref 10.0–149.0)
VLDL: 16.6 mg/dL (ref 0.0–40.0)

## 2024-02-10 LAB — CBC WITH DIFFERENTIAL/PLATELET
Basophils Absolute: 0 K/uL (ref 0.0–0.1)
Basophils Relative: 0.6 % (ref 0.0–3.0)
Eosinophils Absolute: 0.1 K/uL (ref 0.0–0.7)
Eosinophils Relative: 1.6 % (ref 0.0–5.0)
HCT: 44.4 % (ref 39.0–52.0)
Hemoglobin: 15.1 g/dL (ref 13.0–17.0)
Lymphocytes Relative: 18.7 % (ref 12.0–46.0)
Lymphs Abs: 1.3 K/uL (ref 0.7–4.0)
MCHC: 33.9 g/dL (ref 30.0–36.0)
MCV: 90.5 fl (ref 78.0–100.0)
Monocytes Absolute: 0.5 K/uL (ref 0.1–1.0)
Monocytes Relative: 6.7 % (ref 3.0–12.0)
Neutro Abs: 4.9 K/uL (ref 1.4–7.7)
Neutrophils Relative %: 72.4 % (ref 43.0–77.0)
Platelets: 217 K/uL (ref 150.0–400.0)
RBC: 4.91 Mil/uL (ref 4.22–5.81)
RDW: 13.7 % (ref 11.5–15.5)
WBC: 6.8 K/uL (ref 4.0–10.5)

## 2024-02-10 LAB — COMPREHENSIVE METABOLIC PANEL WITH GFR
ALT: 20 U/L (ref 3–53)
AST: 23 U/L (ref 5–37)
Albumin: 4.5 g/dL (ref 3.5–5.2)
Alkaline Phosphatase: 67 U/L (ref 39–117)
BUN: 13 mg/dL (ref 6–23)
CO2: 28 meq/L (ref 19–32)
Calcium: 9.7 mg/dL (ref 8.4–10.5)
Chloride: 103 meq/L (ref 96–112)
Creatinine, Ser: 0.76 mg/dL (ref 0.40–1.50)
GFR: 83.81 mL/min
Glucose, Bld: 85 mg/dL (ref 70–99)
Potassium: 4.2 meq/L (ref 3.5–5.1)
Sodium: 140 meq/L (ref 135–145)
Total Bilirubin: 0.6 mg/dL (ref 0.2–1.2)
Total Protein: 6.9 g/dL (ref 6.0–8.3)

## 2024-02-10 LAB — TSH: TSH: 0.77 u[IU]/mL (ref 0.35–5.50)

## 2024-02-10 LAB — HEMOGLOBIN A1C: Hgb A1c MFr Bld: 6.2 % (ref 4.6–6.5)

## 2024-02-10 NOTE — Patient Instructions (Addendum)
 Pepcid/Famotidine for rash Dawn dish soap All [facility] pharmacies are walk in vaccine clinics M-F 9-4   Preventive Care 65 Years and Older, Male Preventive care refers to lifestyle choices and visits with your health care provider that can promote health and wellness. Preventive care visits are also called wellness exams. What can I expect for my preventive care visit? Counseling During your preventive care visit, your health care provider may ask about your: Medical history, including: Past medical problems. Family medical history. History of falls. Current health, including: Emotional well-being. Home life and relationship well-being. Sexual activity. Memory and ability to understand (cognition). Lifestyle, including: Alcohol, nicotine or tobacco, and drug use. Access to firearms. Diet, exercise, and sleep habits. Work and work astronomer. Sunscreen use. Safety issues such as seatbelt and bike helmet use. Physical exam Your health care provider will check your: Height and weight. These may be used to calculate your BMI (body mass index). BMI is a measurement that tells if you are at a healthy weight. Waist circumference. This measures the distance around your waistline. This measurement also tells if you are at a healthy weight and may help predict your risk of certain diseases, such as type 2 diabetes and high blood pressure. Heart rate and blood pressure. Body temperature. Skin for abnormal spots. What immunizations do I need?  Vaccines are usually given at various ages, according to a schedule. Your health care provider will recommend vaccines for you based on your age, medical history, and lifestyle or other factors, such as travel or where you work. What tests do I need? Screening Your health care provider may recommend screening tests for certain conditions. This may include: Lipid and cholesterol levels. Diabetes screening. This is done by checking your blood sugar  (glucose) after you have not eaten for a while (fasting). Hepatitis C test. Hepatitis B test. HIV (human immunodeficiency virus) test. STI (sexually transmitted infection) testing, if you are at risk. Lung cancer screening. Colorectal cancer screening. Prostate cancer screening. Abdominal aortic aneurysm (AAA) screening. You may need this if you are a current or former smoker. Talk with your health care provider about your test results, treatment options, and if necessary, the need for more tests. Follow these instructions at home: Eating and drinking  Eat a diet that includes fresh fruits and vegetables, whole grains, lean protein, and low-fat dairy products. Limit your intake of foods with high amounts of sugar, saturated fats, and salt. Take vitamin and mineral supplements as recommended by your health care provider. Do not drink alcohol if your health care provider tells you not to drink. If you drink alcohol: Limit how much you have to 0-2 drinks a day. Know how much alcohol is in your drink. In the [place] one drink equals one 12 oz bottle of beer (355 mL), one 5 oz glass of wine (148 mL), or one 1 oz glass of hard liquor (44 mL). Lifestyle Brush your teeth every morning and night with fluoride toothpaste. Floss one time each day. Exercise for at least 30 minutes 5 or more days each week. Do not use any products that contain nicotine or tobacco. These products include cigarettes, chewing tobacco, and vaping devices, such as e-cigarettes. If you need help quitting, ask your health care provider. Do not use drugs. If you are sexually active, practice safe sex. Use a condom or other form of protection to prevent STIs. Take aspirin only as told by your health care provider. Make sure that you understand how much to take  and what form to take. Work with your health care provider to find out whether it is safe and beneficial for you to take aspirin daily. Ask your health care provider if you  need to take a cholesterol-lowering medicine (statin). Find healthy ways to manage stress, such as: Meditation, yoga, or listening to music. Journaling. Talking to a trusted person. Spending time with friends and family. Safety Always wear your seat belt while driving or riding in a vehicle. Do not drive: If you have been drinking alcohol. Do not ride with someone who has been drinking. When you are tired or distracted. While texting. If you have been using any mind-altering substances or drugs. Wear a helmet and other protective equipment during sports activities. If you have firearms in your house, make sure you follow all gun safety procedures. Minimize exposure to UV radiation to reduce your risk of skin cancer. What's next? Visit your health care provider once a year for an annual wellness visit. Ask your health care provider how often you should have your eyes and teeth checked. Stay up to date on all vaccines. This information is not intended to replace advice given to you by your health care provider. Make sure you discuss any questions you have with your health care provider. Document Revised: 08/03/2020 Document Reviewed: 08/03/2020 Elsevier Patient Education  2024 [employer].

## 2024-02-11 ENCOUNTER — Ambulatory Visit: Payer: Self-pay | Admitting: Family Medicine

## 2024-02-25 NOTE — Therapy (Signed)
 " OUTPATIENT PHYSICAL THERAPY EVALUATION   Patient Name: Bradley Peterson MRN: [id number] DOB:[birth year], 84 y.o., male Today's Date: 02/26/2024  END OF SESSION:  PT End of Session - 02/26/24 0917     Visit Number 1    Date for Recertification  04/22/24    Authorization Type [employer]    Progress Note Due on Visit 10    PT Start Time 0917    PT Stop Time 1018    PT Time Calculation (min) 61 min    Activity Tolerance Patient tolerated treatment well    Behavior During Therapy [facility] for tasks assessed/performed          Past Medical History:  Diagnosis Date   Allergic state 12/27/2013   Allergy    Arthritis of neck    Heart murmur    Positive TB test    Past Surgical History:  Procedure Laterality Date   COLONOSCOPY  01/2011   DENTAL SURGERY     TONSILLECTOMY     Late 1940s   UPPER GASTROINTESTINAL ENDOSCOPY  01/2011   Patient Active Problem List   Diagnosis Date Noted   Allergic contact dermatitis due to plants, except food 11/26/2022   Hyperglycemia 02/04/2022   Neck pain 07/11/2020   H/O colonoscopy with polypectomy 01/11/2020   OA (osteoarthritis) of knee 06/19/2017   Preventative health care 01/06/2017   Right bundle branch block 03/13/2016   Medicare annual wellness visit, subsequent 12/27/2013   Allergy 12/27/2013   Abnormal echocardiogram 01/07/2013   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   Positive PPD, 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   Internal hemorrhoid 02/25/2012   Atypical chest pain 02/25/2012   Anemia 02/25/2012    PCP: Domenica Harlene LABOR, MD   REFERRING PROVIDER: Domenica Harlene LABOR, MD   REFERRING DIAG:  R73.9 (ICD-10-CM) - Hyperglycemia  M47.812 (ICD-10-CM) - Osteoarthritis of cervical spine, unspecified spinal osteoarthritis complication status  R42 (ICD-10-CM) - Disequilibrium  R53.1 (ICD-10-CM) - Weakness   THERAPY DIAG:  Unsteadiness on feet  Abnormal posture  Muscle weakness (generalized)  RATIONALE FOR EVALUATION AND TREATMENT:  Rehabilitation  ONSET DATE: January 2025 for balance issues, chronic neck pain/OA since 1980s  NEXT MD VISIT: 08/10/2024   SUBJECTIVE:   SUBJECTIVE STATEMENT: Pt reports he initially went to see PCP for sinus drainage issues that have led to stomach issues.  He also notes issues with his balance to his MD, which is biggest his concern.  He denies any falls but is very careful to avoid circumstances where he might fall.  He has OA in his neck which was first diagnosed several years ago.  He was given exercises at the time that he uses when his neck bothers him.  Also goes to a massage therapist ~1x/mo to help with his neck.  He notes some weakness in his arms primarily.  On rare occasion he will have tingling feeling down into forearms.  PAIN: Are you having pain? Yes: NPRS scale: 2/10 most of the time Pain location: B lower neck/upper shoulders  Pain description: intermittent, dull ache  Aggravating factors: nothing specific  Relieving factors: massage therapy, exercises, heat wrap, Icy/Hot   PERTINENT HISTORY: OA, R BBB, atypical chest pain  PRECAUTIONS: None  HAND DOMINANCE: Right  RED FLAGS: None  WEIGHT BEARING RESTRICTIONS: No  FALLS:  Has patient fallen in last 6 months? No  LIVING ENVIRONMENT: Lives with: lives with their spouse Lives in: House Stairs: Yes: External: 8 steps; on left going up Has following equipment at home: Single point  cane  OCCUPATION: Retired  PLOF: Independent and Leisure: yardwork, walk 2 miles - 4x/wk  PATIENT GOALS: Figure out balance. Also work better with shoulders including better strength with lifting.   OBJECTIVE: (objective measures completed at initial evaluation unless otherwise dated)  DIAGNOSTIC FINDINGS:  None available  PATIENT SURVEYS:  ABC scale: The Activities-Specific Balance Confidence (ABC) Scale 0% 10 20 30  40 50 60 70 80 90 100% No confidence<->completely confident  How confident are you that you will not  lose your balance or become unsteady when you . . .  Date tested 02/26/2024   Walk around the house 100%  2. Walk up or down stairs 90%  3. Bend over and pick up a slipper from in front of a closet floor 90%  4. Reach for a small can off a shelf at eye level 100%  5. Stand on tip toes and reach for something above your head 90%  6. Stand on a chair and reach for something 70%  7. Sweep the floor 100%  8. Walk outside the house to a car parked in the driveway 100%  9. Get into or out of a car 90%  10. Walk across a parking lot to the mall 90%  11. Walk up or down a ramp 90%  12. Walk in a crowded mall where people rapidly walk past you 90%  13. Are bumped into by people as you walk through the mall 80%  14. Step onto or off of an escalator while you are holding onto the railing 90%  15. Step onto or off an escalator while holding onto parcels such that you cannot hold onto the railing 80%  16. Walk outside on icy sidewalks 50%  Total: #/16 87.5%    NDI:  NECK DISABILITY INDEX  Date:  Score - 02/26/2024   Pain intensity 1 = The pain is very mild at the moment  2. Personal care (washing, dressing, etc.) 0 = I can look after myself normally without causing extra pain  3. Lifting 0 =  I can lift heavy weights without extra pain  4. Reading 1 = I can read as much as I want to with slight pain in my neck  5. Headaches 0 = I have no headaches at all  6. Concentration 1 =  I can concentrate fully when I want to with slight difficulty   7. Work 0 =  I can do as much work as I want to  8. Driving 1 =  I can drive my car as long as I want with slight pain in my neck  9. Sleeping 1 = My sleep is slightly disturbed (less than 1 hr sleepless)  10. Recreation 2 = I am able to engage in most, but not all of my usual recreation activities because of   pain in my neck  Total 7/50  % Disability 14.0%   Minimum Detectable Change (90% confidence): 5 points or 10% points  COGNITION: Overall cognitive  status: Within functional limits for tasks assessed     SENSATION: WFL  POSTURE:  forward head and rounded, elevated shoulders  PALPATION: Increased muscle tension with mild soreness on palpation (however pt reports he just had a massage yesterday)  CERVICAL ROM:   Active ROM Eval  Flexion 45  Extension 40 p!  Right lateral flexion 16  Left lateral flexion 19  Right rotation 42  Left rotation 48   (Blank rows = not tested)  UPPER EXTREMITY ROM:  B shoulder WFL  UPPER EXTREMITY MMT:  MMT Right eval Left eval  Shoulder flexion 5 5  Shoulder extension 4 4+  Shoulder abduction 4 4  Shoulder adduction    Shoulder internal rotation 4+ 4+  Shoulder external rotation 4+ 4+  Middle trapezius 4 4  Lower trapezius 3 3-  (Blank rows = not tested)  MUSCLE LENGTH: Hamstrings: mild tight B ITB:  Piriformis: mild tight B Hip flexors: WFL Quads: mild/mod tight R>L Heelcord:   LOWER EXTREMITY ROM: Grossly WFL  LOWER EXTREMITY MMT:  MMT Right eval Left eval  Hip flexion 4+ 4+  Hip extension 4+ 4+  Hip abduction 4- 4-  Hip adduction 4 4  Hip internal rotation 4+ 4+  Hip external rotation 4+ 4+  Knee flexion 5 5  Knee extension 5 5  Ankle dorsiflexion 4 4+  Ankle plantarflexion 4+ 5  Ankle inversion 4+ 4+  Ankle eversion 4 4   (Blank rows = not tested)  FUNCTIONAL TESTS:  5 times sit to stand: 18.65 sec Timed up and go (TUG): 9.04 sec 10 meter walk test: 8.54 sec Gait speed: 3.84 ft/sec  Functional gait assessment:  FUNCTIONAL GAIT ASSESSMENT  Date:  Score - 02/26/2024   GAIT LEVEL SURFACE Instructions: Walk at your normal speed from here to the next mark (6 m) [20 ft]. (2) Mild impairment - Walks 6 m (20 ft) in less than 7 seconds but greater than 5.5 seconds, uses assistive device, slower speed, mild gait deviations, or deviates 15.24 -25.4 cm (6 -10 in) outside of the 30.48-cm (12-in) walkway width.  2.   CHANGE IN GAIT SPEED Instructions: Begin walking at  your normal pace (for 1.5 m [5 ft]). When I tell you go, walk as fast as you can (for 1.5 m [5 ft]). When I tell you slow, walk as slowly as you can (for 1.5 m [5 ft]. (3) Normal - Able to smoothly change walking speed without loss of balance or gait deviation. Shows a significant difference in walking speeds between normal, fast, and slow speeds. Deviates no more than 15.24 cm (6 in) outside of the 30.48-cm (12-in) walkway width.  3.    GAIT WITH HORIZONTAL HEAD TURNS Instructions: Walk from here to the next mark 6 m (20 ft) away. Begin walking at your normal pace. Keep walking straight; after 3 steps, turn your head to the right and keep walking straight while looking to the right. After 3 more steps, turn your head to the left and keep walking straight while looking left. Continue alternating looking right and left. (3) Normal - Performs head turns smoothly with no change in gait. Deviates no more than 15.24 cm (6 in) outside 30.48-cm (12-in) walkway width.  4.   GAIT WITH VERTICAL HEAD TURNS Instructions: Walk from here to the next mark (6 m [20 ft]). Begin walking at your normal pace. Keep walking straight; after 3 steps, tip your head up and keep walking straight while looking up. After 3 more steps, tip your head down, keep walking straight while looking down. Continue  alternating looking up and down every 3 steps until you have completed 2 repetitions in each direction. (3) Normal - Performs head turns with no change in gait. Deviates no more than 15.24 cm (6 in) outside 30.48-cm (12-in) walkway width.  5.  GAIT AND PIVOT TURN Instructions: Begin with walking at your normal pace. When I tell you, turn and stop, turn as quickly as you can to face the  opposite direction and stop. (3) Normal - Pivot turns safely within 3 seconds and stops quickly with no loss of balance  6.   STEP OVER OBSTACLE Instructions: Begin walking at your normal speed. When you come to the shoe box, step over it, not  around it, and keep walking. (2) Mild impairment - Is able to step over one shoe box (11.43 cm [4.5 in] total height) without changing gait speed; no evidence of imbalance.  7.   GAIT WITH NARROW BASE OF SUPPORT Instructions: Walk on the floor with arms folded across the chest, feet aligned heel to toe in tandem for a distance of 3.6 m [12 ft]. The number of steps taken in a straight line are counted for a maximum of 10 steps. (1) Moderate impairment - Ambulates 4 -7 steps.  8.   GAIT WITH EYES CLOSED Instructions: Walk at your normal speed from here to the next mark (6 m [20 ft]) with your eyes closed. (1) Moderate impairment - Walks 6 m (20 ft), slow speed, abnormal gait pattern, evidence for imbalance, deviates 25.4 -38.1 cm (10 -15 in) outside 30.48-cm (12-in) walkway width. Requires more than 9 seconds to ambulate 6 m (20 ft).  9.   AMBULATING BACKWARDS Instructions: Walk backwards until I tell you to stop (2) Mild impairment - Walks 6 m (20 ft), uses assistive device, slower speed, mild gait deviations, deviates 15.24 -25.4 cm (6 -10 in) outside 30.48-cm (12-in) walkway width  10. STEPS Instructions: Walk up these stairs as you would at home (ie, using the rail if necessary). At the top turn around and walk down. (2) Mild impairment-Alternating feet, must use rail.  Total 22/30  Interpretation: 19-24 = medium risk fall    Interpretation of scores: Non-Specific Older Adults Cutoff Score: <=22/30 = risk of falls Parkinsons Disease Cutoff score <15/30= fall risk (Hoehn & Yahr 1-4)  Minimally Clinically Important Difference (MCID)  Stroke (acute, subacute, and chronic) = MDC: 4.2 points Vestibular (acute) = MDC: 6 points Community Dwelling Older Adults =  MCID: 4 points Parkinsons Disease  =  MDC: 4.3 points  (Academy of Neurologic Physical Therapy (nd). Functional Gait Assessment. Retrieved from  https://www.neuropt.org/docs/default-source/cpgs/core-outcome-measures/function-gait-assessment-pocket-guide-proof9-(2).pdf?sfvrsn=b21f35043_0.) SLS: R = 30.94 sec, L = 30.50 sec  BED MOBILITY:  Independent  TRANSFERS: Assistive device utilized: None  Sit to stand: Complete Independence Stand to sit: Complete Independence Chair to chair: Complete Independence Floor: NT  GAIT: Distance walked: clinic distances Assistive device utilized: None Level of assistance: Complete Independence Gait pattern: WFL  STAIRS:  Level of Assistance: Modified independence  Stair Negotiation Technique: Alternating Pattern Forwards with Single Rail on Right on descent only  Number of Stairs: 14   Height of Stairs: 7    TODAY'S TREATMENT:   02/26/2024 - Eval SELF CARE:  Reviewed eval findings and role of PT in addressing identified deficits as well as review of his personal HEP and instruction in initial HEP (see below).    PATIENT EDUCATION:  Education details: PT eval findings, anticipated POC, standardized testing results and interpretation, initial HEP, and postural awareness  Person educated: Patient Education method: Explanation, Demonstration, Verbal cues, Handouts, and MedBridgeGO app access provided Education comprehension: verbalized understanding, returned demonstration, verbal cues required, and needs further education  HOME EXERCISE PROGRAM: Access Code: [email or web address] URL: [email or web address] Date: 02/26/2024 Prepared by: Elijah Hidden  Exercises - Seated Cervical Retraction  - 2 x daily - 7 x weekly - 2 sets - 10 reps - 3-5 sec hold - Standing Backward Shoulder  Rolls  - 2 x daily - 7 x weekly - 2 sets - 10 reps - 3 sec hold - Seated Scapular Retraction  - 2 x daily - 7 x weekly - 2 sets - 10 reps - 3-5 sec hold   ASSESSMENT:  CLINICAL IMPRESSION: Bradley Peterson is a 84 y.o. male who was referred to physical therapy for evaluation and treatment for cervical OA,  disequilibrium and weakness.   Patient reports chronic intermittent neck pain related to cervical OA originating back in the 1980s.  He is typically able to manage the neck pain with exercises has has been given in the past. Patient has deficits in cervical ROM, postural and proximal LE flexibility, UE and LE strength, abnormal posture, and TTP with abnormal muscle tension which are interfering with ADLs and are impacting quality of life.  On NDI patient scored 7/50 demonstrating 14% disability.  His biggest concern is his balance and fear of falling.  Gait speed of 3.84 ft/sec, (2.62 ft/sec is needed for community access) and TUG of 9.04 sec (>13.5 sec indicates increased risk for falls) are WNL, however examination revealed patient is at risk for falls and functional decline as evidenced by the following objective test measures: and 5xSTS of 18.65 sec (>15 sec indicates increased risk for falls and decreased BLE power) and FGA score of 22/30 (19-24 = medium risk for falls). ABC scale score of 87.5% indicates a high level of physical functioning.  Bradley Peterson will benefit from skilled PT to address above deficits to improve mobility and activity tolerance to help reach the maximal level of functional independence and mobility, with decreased risk for falls. Patient demonstrates understanding of this POC and is in agreement with this plan.    OBJECTIVE IMPAIRMENTS: decreased balance, decreased knowledge of condition, decreased ROM, decreased strength, decreased safety awareness, increased fascial restrictions, impaired perceived functional ability, increased muscle spasms, impaired flexibility, improper body mechanics, postural dysfunction, and pain.   ACTIVITY LIMITATIONS: sleeping, locomotion level, and caring for others  PARTICIPATION LIMITATIONS: driving, community activity, and yard work  PERSONAL FACTORS: Age, Past/current experiences, Time since onset of injury/illness/exacerbation, and 3+  comorbidities: OA, R BBB, atypical chest pain are also affecting patient's functional outcome.   REHAB POTENTIAL: Excellent  CLINICAL DECISION MAKING: Evolving/moderate complexity  EVALUATION COMPLEXITY: Moderate   GOALS: Goals reviewed with patient? Yes  SHORT TERM GOALS: Target date: 03/25/2024  Patient will be independent with initial HEP. Baseline: HEP initiated on eval Goal status: INITIAL  2.  Patient will improve 5x STS time to </= 15 seconds to demonstrate improved functional strength and transfer efficiency. Baseline: 18.65 Goal status: INITIAL  LONG TERM GOALS: Target date: 04/22/2024  Patient will be independent with advanced/ongoing HEP to improve outcomes and carryover.  Baseline:  Goal status: INITIAL  2.  Patient will report at least 50% improvement in neck pain to improve QOL. Baseline: 2/10 Goal status: INITIAL  3.  Patient to demonstrate ability to achieve and maintain good spinal alignment/posturing and body mechanics needed for daily activities. Baseline:  Goal status: INITIAL  4.  Patient will demonstrate improved cervical AROM to [facility] to allow for improved safety with checking blind spot while driving. Baseline: Refer to above cervical ROM table Goal status: INITIAL  5.  Patient will demonstrate improved B UE and postural strength to >/= 4+/5 for functional UE use. Baseline: Refer to above UE MMT table Goal status: INITIAL  6.  Patient will demonstrate improved B LE strength to >/= 4+/5  for improved stability and ease of mobility. Baseline: Refer to above LE MMT table Goal status: INITIAL  7.  Patient will report >/= 92% on ABC scale to demonstrate improved balance confidence. Baseline: 1400 / 1600 = 87.5 % Goal status: INITIAL  8.  Patient will report </= 8% on NDI to demonstrate improved functional ability. Baseline: 7 / 50 = 14.0 % Goal status: INITIAL  9.  Patient will demonstrate at least 26/30 on FGA to decrease risk of falls. Baseline:  22/30 Goal status: INITIAL    PLAN:  PT FREQUENCY: 2x/week  PT DURATION: 8 weeks  PLANNED INTERVENTIONS: [id number]- PT Re-evaluation, 97750- Physical Performance Testing, 97110-Therapeutic exercises, 97530- Therapeutic activity, 97112- Neuromuscular re-education, 97535- Self Care, [id number]- Manual therapy, [phone]- Gait training, [phone]- Electrical stimulation (unattended), 97035- Ultrasound, 79439 (1-2 muscles), [id number] (3+ muscles)- Dry Needling, Patient/Family education, Balance training, Taping, Joint mobilization, Spinal mobilization, Cryotherapy, and Moist heat  PLAN FOR NEXT SESSION: Review initial HEP; postural awareness; progress postural and initiate core/LE strengthening, updating HEP as indicated; balance training   Elijah CHRISTELLA Hidden, PT 02/26/2024, 10:58 AM  "

## 2024-02-26 ENCOUNTER — Ambulatory Visit: Attending: Family Medicine | Admitting: Physical Therapy

## 2024-02-26 ENCOUNTER — Encounter: Payer: Self-pay | Admitting: Physical Therapy

## 2024-02-26 ENCOUNTER — Other Ambulatory Visit: Payer: Self-pay

## 2024-02-26 DIAGNOSIS — R531 Weakness: Secondary | ICD-10-CM | POA: Diagnosis not present

## 2024-02-26 DIAGNOSIS — M6281 Muscle weakness (generalized): Secondary | ICD-10-CM | POA: Diagnosis present

## 2024-02-26 DIAGNOSIS — R293 Abnormal posture: Secondary | ICD-10-CM | POA: Insufficient documentation

## 2024-02-26 DIAGNOSIS — M47812 Spondylosis without myelopathy or radiculopathy, cervical region: Secondary | ICD-10-CM | POA: Insufficient documentation

## 2024-02-26 DIAGNOSIS — R739 Hyperglycemia, unspecified: Secondary | ICD-10-CM | POA: Diagnosis not present

## 2024-02-26 DIAGNOSIS — R42 Dizziness and giddiness: Secondary | ICD-10-CM | POA: Diagnosis not present

## 2024-02-26 DIAGNOSIS — R2681 Unsteadiness on feet: Secondary | ICD-10-CM | POA: Diagnosis present

## 2024-02-28 ENCOUNTER — Encounter: Payer: Self-pay | Admitting: Physical Therapy

## 2024-02-28 ENCOUNTER — Ambulatory Visit: Admitting: Physical Therapy

## 2024-02-28 DIAGNOSIS — R293 Abnormal posture: Secondary | ICD-10-CM

## 2024-02-28 DIAGNOSIS — R2681 Unsteadiness on feet: Secondary | ICD-10-CM | POA: Diagnosis not present

## 2024-02-28 DIAGNOSIS — M6281 Muscle weakness (generalized): Secondary | ICD-10-CM

## 2024-02-28 NOTE — Therapy (Signed)
 " OUTPATIENT PHYSICAL THERAPY TREATMENT   Patient Name: Bradley Peterson MRN: [id number] DOB:[birth year], 84 y.o., male Today's Date: 02/28/2024  END OF SESSION:  PT End of Session - 02/28/24 0840     Visit Number 2    Date for Recertification  04/22/24    Authorization Type [employer]    Progress Note Due on Visit 10    PT Start Time 0840    PT Stop Time 0925    PT Time Calculation (min) 45 min    Activity Tolerance Patient tolerated treatment well    Behavior During Therapy [facility] for tasks assessed/performed           Past Medical History:  Diagnosis Date   Allergic state 12/27/2013   Allergy    Arthritis of neck    Heart murmur    Positive TB test    Past Surgical History:  Procedure Laterality Date   COLONOSCOPY  01/2011   DENTAL SURGERY     TONSILLECTOMY     Late 1940s   UPPER GASTROINTESTINAL ENDOSCOPY  01/2011   Patient Active Problem List   Diagnosis Date Noted   Allergic contact dermatitis due to plants, except food 11/26/2022   Hyperglycemia 02/04/2022   Neck pain 07/11/2020   H/O colonoscopy with polypectomy 01/11/2020   OA (osteoarthritis) of knee 06/19/2017   Preventative health care 01/06/2017   Right bundle branch block 03/13/2016   Medicare annual wellness visit, subsequent 12/27/2013   Allergy 12/27/2013   Abnormal echocardiogram 01/07/2013   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   Positive PPD, 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   Internal hemorrhoid 02/25/2012   Atypical chest pain 02/25/2012   Anemia 02/25/2012    PCP: Domenica Harlene LABOR, MD   REFERRING PROVIDER: Domenica Harlene LABOR, MD   REFERRING DIAG:  R73.9 (ICD-10-CM) - Hyperglycemia  M47.812 (ICD-10-CM) - Osteoarthritis of cervical spine, unspecified spinal osteoarthritis complication status  R42 (ICD-10-CM) - Disequilibrium  R53.1 (ICD-10-CM) - Weakness   THERAPY DIAG:  Unsteadiness on feet  Abnormal posture  Muscle weakness (generalized)  RATIONALE FOR EVALUATION AND TREATMENT:  Rehabilitation  ONSET DATE: January 2025 for balance issues, chronic neck pain/OA since 1980s  NEXT MD VISIT: 08/10/2024   SUBJECTIVE:   SUBJECTIVE STATEMENT: Signe inquired about possibility of using postural brace to help with his postural awareness.  Just the usual dull pain this morning but feels like his prior exercises and the initial HEP exercises have already been helping.  EVAL:  Pt reports he initially went to see PCP for sinus drainage issues that have led to stomach issues.  He also noted issues with his balance to his MD, which is biggest his concern.  He denies any falls but is very careful to avoid circumstances where he might fall.  He has OA in his neck which was first diagnosed several years ago.  He was given exercises at the time that he uses when his neck bothers him.  Also goes to a massage therapist ~1x/mo to help with his neck.  He notes some weakness in his arms primarily.  On rare occasion he will have tingling feeling down into forearms.  PAIN: Are you having pain? Yes: NPRS scale: 2/10 most of the time Pain location: B lower neck/upper shoulders  Pain description: intermittent, dull ache  Aggravating factors: nothing specific  Relieving factors: massage therapy, exercises, heat wrap, Icy/Hot   PERTINENT HISTORY: OA, R BBB, atypical chest pain  PRECAUTIONS: None  HAND DOMINANCE: Right  RED FLAGS: None  WEIGHT BEARING  RESTRICTIONS: No  FALLS:  Has patient fallen in last 6 months? No  LIVING ENVIRONMENT: Lives with: lives with their spouse Lives in: House Stairs: Yes: External: 8 steps; on left going up Has following equipment at home: Single point cane  OCCUPATION: Retired  PLOF: Independent and Leisure: yardwork, walk 2 miles - 4x/wk  PATIENT GOALS: Figure out balance. Also work better with shoulders including better strength with lifting.   OBJECTIVE: (objective measures completed at initial evaluation unless otherwise dated)  DIAGNOSTIC  FINDINGS:  None available  PATIENT SURVEYS:  ABC scale: The Activities-Specific Balance Confidence (ABC) Scale 0% 10 20 30  40 50 60 70 80 90 100% No confidence<->completely confident  How confident are you that you will not lose your balance or become unsteady when you . . .  Date tested 02/26/2024   Walk around the house 100%  2. Walk up or down stairs 90%  3. Bend over and pick up a slipper from in front of a closet floor 90%  4. Reach for a small can off a shelf at eye level 100%  5. Stand on tip toes and reach for something above your head 90%  6. Stand on a chair and reach for something 70%  7. Sweep the floor 100%  8. Walk outside the house to a car parked in the driveway 100%  9. Get into or out of a car 90%  10. Walk across a parking lot to the mall 90%  11. Walk up or down a ramp 90%  12. Walk in a crowded mall where people rapidly walk past you 90%  13. Are bumped into by people as you walk through the mall 80%  14. Step onto or off of an escalator while you are holding onto the railing 90%  15. Step onto or off an escalator while holding onto parcels such that you cannot hold onto the railing 80%  16. Walk outside on icy sidewalks 50%  Total: #/16 87.5%    NDI:  NECK DISABILITY INDEX  Date:  Score - 02/26/2024   Pain intensity 1 = The pain is very mild at the moment  2. Personal care (washing, dressing, etc.) 0 = I can look after myself normally without causing extra pain  3. Lifting 0 =  I can lift heavy weights without extra pain  4. Reading 1 = I can read as much as I want to with slight pain in my neck  5. Headaches 0 = I have no headaches at all  6. Concentration 1 =  I can concentrate fully when I want to with slight difficulty   7. Work 0 =  I can do as much work as I want to  8. Driving 1 =  I can drive my car as long as I want with slight pain in my neck  9. Sleeping 1 = My sleep is slightly disturbed (less than 1 hr sleepless)  10. Recreation 2 = I am able to  engage in most, but not all of my usual recreation activities because of   pain in my neck  Total 7/50  % Disability 14.0%   Minimum Detectable Change (90% confidence): 5 points or 10% points  COGNITION: Overall cognitive status: Within functional limits for tasks assessed     SENSATION: WFL  POSTURE:  forward head and rounded, elevated shoulders  PALPATION: Increased muscle tension with mild soreness on palpation (however pt reports he just had a massage yesterday)  CERVICAL ROM:  Active ROM Eval  Flexion 45  Extension 40 p!  Right lateral flexion 16  Left lateral flexion 19  Right rotation 42  Left rotation 48   (Blank rows = not tested)  UPPER EXTREMITY ROM:  B shoulder WFL  UPPER EXTREMITY MMT:  MMT Right eval Left eval  Shoulder flexion 5 5  Shoulder extension 4 4+  Shoulder abduction 4 4  Shoulder adduction    Shoulder internal rotation 4+ 4+  Shoulder external rotation 4+ 4+  Middle trapezius 4 4  Lower trapezius 3 3-  (Blank rows = not tested)  MUSCLE LENGTH: Hamstrings: mild tight B ITB:  Piriformis: mild tight B Hip flexors: WFL Quads: mild/mod tight R>L Heelcord:   LOWER EXTREMITY ROM: Grossly WFL  LOWER EXTREMITY MMT:  MMT Right eval Left eval  Hip flexion 4+ 4+  Hip extension 4+ 4+  Hip abduction 4- 4-  Hip adduction 4 4  Hip internal rotation 4+ 4+  Hip external rotation 4+ 4+  Knee flexion 5 5  Knee extension 5 5  Ankle dorsiflexion 4 4+  Ankle plantarflexion 4+ 5  Ankle inversion 4+ 4+  Ankle eversion 4 4   (Blank rows = not tested)  FUNCTIONAL TESTS:  5 times sit to stand: 18.65 sec Timed up and go (TUG): 9.04 sec 10 meter walk test: 8.54 sec Gait speed: 3.84 ft/sec  Functional gait assessment:  FUNCTIONAL GAIT ASSESSMENT  Date:  Score - 02/26/2024   GAIT LEVEL SURFACE Instructions: Walk at your normal speed from here to the next mark (6 m) [20 ft]. (2) Mild impairment - Walks 6 m (20 ft) in less than 7 seconds but  greater than 5.5 seconds, uses assistive device, slower speed, mild gait deviations, or deviates 15.24 -25.4 cm (6 -10 in) outside of the 30.48-cm (12-in) walkway width.  2.   CHANGE IN GAIT SPEED Instructions: Begin walking at your normal pace (for 1.5 m [5 ft]). When I tell you go, walk as fast as you can (for 1.5 m [5 ft]). When I tell you slow, walk as slowly as you can (for 1.5 m [5 ft]. (3) Normal - Able to smoothly change walking speed without loss of balance or gait deviation. Shows a significant difference in walking speeds between normal, fast, and slow speeds. Deviates no more than 15.24 cm (6 in) outside of the 30.48-cm (12-in) walkway width.  3.    GAIT WITH HORIZONTAL HEAD TURNS Instructions: Walk from here to the next mark 6 m (20 ft) away. Begin walking at your normal pace. Keep walking straight; after 3 steps, turn your head to the right and keep walking straight while looking to the right. After 3 more steps, turn your head to the left and keep walking straight while looking left. Continue alternating looking right and left. (3) Normal - Performs head turns smoothly with no change in gait. Deviates no more than 15.24 cm (6 in) outside 30.48-cm (12-in) walkway width.  4.   GAIT WITH VERTICAL HEAD TURNS Instructions: Walk from here to the next mark (6 m [20 ft]). Begin walking at your normal pace. Keep walking straight; after 3 steps, tip your head up and keep walking straight while looking up. After 3 more steps, tip your head down, keep walking straight while looking down. Continue  alternating looking up and down every 3 steps until you have completed 2 repetitions in each direction. (3) Normal - Performs head turns with no change in gait. Deviates no more than  15.24 cm (6 in) outside 30.48-cm (12-in) walkway width.  5.  GAIT AND PIVOT TURN Instructions: Begin with walking at your normal pace. When I tell you, turn and stop, turn as quickly as you can to face the opposite direction  and stop. (3) Normal - Pivot turns safely within 3 seconds and stops quickly with no loss of balance  6.   STEP OVER OBSTACLE Instructions: Begin walking at your normal speed. When you come to the shoe box, step over it, not around it, and keep walking. (2) Mild impairment - Is able to step over one shoe box (11.43 cm [4.5 in] total height) without changing gait speed; no evidence of imbalance.  7.   GAIT WITH NARROW BASE OF SUPPORT Instructions: Walk on the floor with arms folded across the chest, feet aligned heel to toe in tandem for a distance of 3.6 m [12 ft]. The number of steps taken in a straight line are counted for a maximum of 10 steps. (1) Moderate impairment - Ambulates 4 -7 steps.  8.   GAIT WITH EYES CLOSED Instructions: Walk at your normal speed from here to the next mark (6 m [20 ft]) with your eyes closed. (1) Moderate impairment - Walks 6 m (20 ft), slow speed, abnormal gait pattern, evidence for imbalance, deviates 25.4 -38.1 cm (10 -15 in) outside 30.48-cm (12-in) walkway width. Requires more than 9 seconds to ambulate 6 m (20 ft).  9.   AMBULATING BACKWARDS Instructions: Walk backwards until I tell you to stop (2) Mild impairment - Walks 6 m (20 ft), uses assistive device, slower speed, mild gait deviations, deviates 15.24 -25.4 cm (6 -10 in) outside 30.48-cm (12-in) walkway width  10. STEPS Instructions: Walk up these stairs as you would at home (ie, using the rail if necessary). At the top turn around and walk down. (2) Mild impairment-Alternating feet, must use rail.  Total 22/30  Interpretation: 19-24 = medium risk fall    Interpretation of scores: Non-Specific Older Adults Cutoff Score: <=22/30 = risk of falls Parkinsons Disease Cutoff score <15/30= fall risk (Hoehn & Yahr 1-4)  Minimally Clinically Important Difference (MCID)  Stroke (acute, subacute, and chronic) = MDC: 4.2 points Vestibular (acute) = MDC: 6 points Community Dwelling Older Adults =  MCID: 4  points Parkinsons Disease  =  MDC: 4.3 points  (Academy of Neu[employer]ic Physical Therapy (nd). Functional Gait Assessment. Retrieved from https://www.neuropt.org/docs/default-source/cpgs/core-outcome-measures/function-gait-assessment-pocket-guide-proof9-(2).pdf?sfvrsn=b60f35043_0.) SLS: R = 30.94 sec, L = 30.50 sec  BED MOBILITY:  Independent  TRANSFERS: Assistive device utilized: None  Sit to stand: Complete Independence Stand to sit: Complete Independence Chair to chair: Complete Independence Floor: NT  GAIT: Distance walked: clinic distances Assistive device utilized: None Level of assistance: Complete Independence Gait pattern: WFL  STAIRS:  Level of Assistance: Modified independence  Stair Negotiation Technique: Alternating Pattern Forwards with Single Rail on Right on descent only  Number of Stairs: 14   Height of Stairs: 7    TODAY'S TREATMENT:   02/28/2024  THERAPEUTIC EXERCISE: To improve strength, endurance, ROM, and flexibility.  Demonstration, verbal and tactile cues throughout for technique.  NuStep - L4 x 6' (UE/LE) Seated cervical retraction 10 x 5 Backward shoulder rolls x 10 Seated scapular retraction 10 x 5 Seated UT stretch 2 x 30 B Seated LS stretch 2 x 30 B  NEUROMUSCULAR RE-EDUCATION: To improve strength, coordination, kinesthesia, posture, proprioception, and balance. Seated scapular retraction + GTB B shoulder ER 2 x 10 - pt demonstrating tendency for R shoulder  shrug, therefore moved him into chair with pool noodle along spine to provide tactile cues for scapular activation Standing scapular retraction + GTB B row 2 x 10 Standing scapular retraction + GTB B shoulder extension 2 x 10 Standing RTB hip ABD 2 x 10 B Standing RTB hip ABD/extension diagonal x 10 B   02/26/2024 - Eval SELF CARE:  Reviewed eval findings and role of PT in addressing identified deficits as well as review of his personal HEP and instruction in initial HEP (see below).     PATIENT EDUCATION:  Education details: HEP review, HEP update - postural and hip strengthening, and postural awareness  Person educated: Patient Education method: Explanation, Demonstration, Verbal cues, Tactile cues, and Handouts Education comprehension: verbalized understanding, returned demonstration, verbal cues required, tactile cues required, and needs further education  HOME EXERCISE PROGRAM: Access Code: [email or web address] URL: [email or web address] Date: 02/28/2024 Prepared by: Bradley Peterson  Exercises - Seated Cervical Retraction  - 2 x daily - 7 x weekly - 2 sets - 10 reps - 3-5 sec hold - Standing Backward Shoulder Rolls  - 2 x daily - 7 x weekly - 2 sets - 10 reps - 3 sec hold - Seated Scapular Retraction  - 2 x daily - 7 x weekly - 2 sets - 10 reps - 3-5 sec hold - Seated Upper Trapezius Stretch  - 2 x daily - 7 x weekly - 3 reps - 30 sec hold - Gentle Levator Scapulae Stretch  - 2 x daily - 7 x weekly - 3 reps - 30 sec hold - Standing Bilateral Low Shoulder Row with Anchored Resistance  - 1 x daily - 7 x weekly - 2 sets - 10 reps - 5 sec hold - Standing Hip Abduction with Resistance at Ankles and Counter Support  - 1 x daily - 7 x weekly - 2 sets - 10 reps - 3 sec hold - Diagonal Hip Extension with Resistance  - 1 x daily - 7 x weekly - 2 sets - 10 reps - 3 sec hold   ASSESSMENT:  CLINICAL IMPRESSION: Bradley Peterson reports benefit from hs home exercises in managing his neck pain.  HEP reviewed, progressing scapular strengthening with addition of GTB while incorporating rows, shoulder extension and ER.  More difficulty noted with ER - pt demonstrating tendency for shoulder shrug (esp on R) but better after pool noodle provided for tactile cues for scapular activation.  Introduced stretching for UT and LS to help reduce increased muscle tension.  Initiated hip strengthening with emphasis on abductors  to promote better stability for improved balance.  HEP updated to  reflect some of exercise progression.  Bradley Peterson will benefit from continued skilled PT to address ongoing postural ROM/flexibility, strength and balance deficits to improve mobility and activity tolerance with decreased pain interference and decreased risk for falls.    EVAL: Bradley Peterson is a 84 y.o. male who was referred to physical therapy for evaluation and treatment for cervical OA, disequilibrium and weakness.   Patient reports chronic intermittent neck pain related to cervical OA originating back in the 1980s.  He is typically able to manage the neck pain with exercises has has been given in the past. Patient has deficits in cervical ROM, postural and proximal LE flexibility, UE and LE strength, abnormal posture, and TTP with abnormal muscle tension which are interfering with ADLs and are impacting quality of life.  On NDI patient scored 7/50 demonstrating 14% disability.  His biggest concern is his  balance and fear of falling.  Gait speed of 3.84 ft/sec, (2.62 ft/sec is needed for community access) and TUG of 9.04 sec (>13.5 sec indicates increased risk for falls) are WNL, however examination revealed patient is at risk for falls and functional decline as evidenced by the following objective test measures: and 5xSTS of 18.65 sec (>15 sec indicates increased risk for falls and decreased BLE power) and FGA score of 22/30 (19-24 = medium risk for falls). ABC scale score of 87.5% indicates a high level of physical functioning.  Bradley Peterson will benefit from skilled PT to address above deficits to improve mobility and activity tolerance to help reach the maximal level of functional independence and mobility, with decreased risk for falls. Patient demonstrates understanding of this POC and is in agreement with this plan.    OBJECTIVE IMPAIRMENTS: decreased balance, decreased knowledge of condition, decreased ROM, decreased strength, decreased safety awareness, increased fascial restrictions, impaired perceived  functional ability, increased muscle spasms, impaired flexibility, improper body mechanics, postural dysfunction, and pain.   ACTIVITY LIMITATIONS: sleeping, locomotion level, and caring for others  PARTICIPATION LIMITATIONS: driving, community activity, and yard work  PERSONAL FACTORS: Age, Past/current experiences, Time since onset of injury/illness/exacerbation, and 3+ comorbidities: OA, R BBB, atypical chest pain are also affecting patient's functional outcome.   REHAB POTENTIAL: Excellent  CLINICAL DECISION MAKING: Evolving/moderate complexity  EVALUATION COMPLEXITY: Moderate   GOALS: Goals reviewed with patient? Yes  SHORT TERM GOALS: Target date: 03/25/2024  Patient will be independent with initial HEP. Baseline: HEP initiated on eval Goal status: IN PROGRESS - 02/28/24 - HEP reviewed and updated  2.  Patient will improve 5x STS time to </= 15 seconds to demonstrate improved functional strength and transfer efficiency. Baseline: 18.65 Goal status: INITIAL  LONG TERM GOALS: Target date: 04/22/2024  Patient will be independent with advanced/ongoing HEP to improve outcomes and carryover.  Baseline:  Goal status: INITIAL  2.  Patient will report at least 50% improvement in neck pain to improve QOL. Baseline: 2/10 Goal status: INITIAL  3.  Patient to demonstrate ability to achieve and maintain good spinal alignment/posturing and body mechanics needed for daily activities. Baseline:  Goal status: INITIAL  4.  Patient will demonstrate improved cervical AROM to [facility] to allow for improved safety with checking blind spot while driving. Baseline: Refer to above cervical ROM table Goal status: INITIAL  5.  Patient will demonstrate improved B UE and postural strength to >/= 4+/5 for functional UE use. Baseline: Refer to above UE MMT table Goal status: INITIAL  6.  Patient will demonstrate improved B LE strength to >/= 4+/5 for improved stability and ease of mobility. Baseline:  Refer to above LE MMT table Goal status: INITIAL  7.  Patient will report >/= 92% on ABC scale to demonstrate improved balance confidence. Baseline: 1400 / 1600 = 87.5 % Goal status: INITIAL  8.  Patient will report </= 8% on NDI to demonstrate improved functional ability. Baseline: 7 / 50 = 14.0 % Goal status: INITIAL  9.  Patient will demonstrate at least 26/30 on FGA to decrease risk of falls. Baseline: 22/30 Goal status: INITIAL    PLAN:  PT FREQUENCY: 2x/week  PT DURATION: 8 weeks  PLANNED INTERVENTIONS: [id number]- PT Re-evaluation, 97750- Physical Performance Testing, 97110-Therapeutic exercises, 97530- Therapeutic activity, [id number]- Neuromuscular re-education, 97535- Self Care, [id number]- Manual therapy, [id number]- Gait training, [phone]- Electrical stimulation (unattended), 97035- Ultrasound, [id number] (1-2 muscles), [id number] (3+ muscles)- Dry Needling, Patient/Family education, Balance training, Taping, Joint mobilization,  Spinal mobilization, Cryotherapy, and Moist heat  PLAN FOR NEXT SESSION: postural awareness; progress postural and core/LE strengthening, review & update HEP as indicated; balance training   Bradley Peterson, PT 02/28/2024, 9:28 AM  "

## 2024-03-03 ENCOUNTER — Ambulatory Visit

## 2024-03-03 DIAGNOSIS — R2681 Unsteadiness on feet: Secondary | ICD-10-CM | POA: Diagnosis not present

## 2024-03-03 DIAGNOSIS — M6281 Muscle weakness (generalized): Secondary | ICD-10-CM

## 2024-03-03 DIAGNOSIS — R293 Abnormal posture: Secondary | ICD-10-CM

## 2024-03-03 NOTE — Therapy (Signed)
 " OUTPATIENT PHYSICAL THERAPY TREATMENT   Patient Name: Bradley Peterson MRN: [id number] DOB:09-15-1940, 84 y.o., male Today's Date: 03/03/2024  END OF SESSION:  PT End of Session - 03/03/24 0845     Visit Number 3    Date for Recertification  04/22/24    Authorization Type [employer]    Progress Note Due on Visit 10    PT Start Time 0840    PT Stop Time 0924    PT Time Calculation (min) 44 min    Activity Tolerance Patient tolerated treatment well    Behavior During Therapy [facility] for tasks assessed/performed            Past Medical History:  Diagnosis Date   Allergic state 12/27/2013   Allergy    Arthritis of neck    Heart murmur    Positive TB test    Past Surgical History:  Procedure Laterality Date   COLONOSCOPY  01/2011   DENTAL SURGERY     TONSILLECTOMY     Late 1940s   UPPER GASTROINTESTINAL ENDOSCOPY  01/2011   Patient Active Problem List   Diagnosis Date Noted   Allergic contact dermatitis due to plants, except food 11/26/2022   Hyperglycemia 02/04/2022   Neck pain 07/11/2020   H/O colonoscopy with polypectomy 01/11/2020   OA (osteoarthritis) of knee 06/19/2017   Preventative health care 01/06/2017   Right bundle branch block 03/13/2016   Medicare annual wellness visit, subsequent 12/27/2013   Allergy 12/27/2013   Abnormal echocardiogram 01/07/2013   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   Positive PPD, 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   Internal hemorrhoid 02/25/2012   Atypical chest pain 02/25/2012   Anemia 02/25/2012    PCP: Domenica Harlene LABOR, MD   REFERRING PROVIDER: Domenica Harlene LABOR, MD   REFERRING DIAG:  R73.9 (ICD-10-CM) - Hyperglycemia  M47.812 (ICD-10-CM) - Osteoarthritis of cervical spine, unspecified spinal osteoarthritis complication status  R42 (ICD-10-CM) - Disequilibrium  R53.1 (ICD-10-CM) - Weakness   THERAPY DIAG:  Unsteadiness on feet  Abnormal posture  Muscle weakness (generalized)  RATIONALE FOR EVALUATION AND TREATMENT:  Rehabilitation  ONSET DATE: January 2025 for balance issues, chronic neck pain/OA since 1980s  NEXT MD VISIT: 08/10/2024   SUBJECTIVE:   SUBJECTIVE STATEMENT: Pt reports neck pain about the same today  EVAL:  Pt reports he initially went to see PCP for sinus drainage issues that have led to stomach issues.  He also noted issues with his balance to his MD, which is biggest his concern.  He denies any falls but is very careful to avoid circumstances where he might fall.  He has OA in his neck which was first diagnosed several years ago.  He was given exercises at the time that he uses when his neck bothers him.  Also goes to a massage therapist ~1x/mo to help with his neck.  He notes some weakness in his arms primarily.  On rare occasion he will have tingling feeling down into forearms.  PAIN: Are you having pain? Yes: NPRS scale: 2-3/10 most of the time Pain location: B lower neck/upper shoulders  Pain description: intermittent, dull ache  Aggravating factors: nothing specific  Relieving factors: massage therapy, exercises, heat wrap, Icy/Hot   PERTINENT HISTORY: OA, R BBB, atypical chest pain  PRECAUTIONS: None  HAND DOMINANCE: Right  RED FLAGS: None  WEIGHT BEARING RESTRICTIONS: No  FALLS:  Has patient fallen in last 6 months? No  LIVING ENVIRONMENT: Lives with: lives with their spouse Lives in: House Stairs: Yes: External:  8 steps; on left going up Has following equipment at home: Single point cane  OCCUPATION: Retired  PLOF: Independent and Leisure: yardwork, walk 2 miles - 4x/wk  PATIENT GOALS: Figure out balance. Also work better with shoulders including better strength with lifting.   OBJECTIVE: (objective measures completed at initial evaluation unless otherwise dated)  DIAGNOSTIC FINDINGS:  None available  PATIENT SURVEYS:  ABC scale: The Activities-Specific Balance Confidence (ABC) Scale 0% 10 20 30  40 50 60 70 80 90 100% No confidence<->completely  confident  How confident are you that you will not lose your balance or become unsteady when you . . .  Date tested 02/26/2024   Walk around the house 100%  2. Walk up or down stairs 90%  3. Bend over and pick up a slipper from in front of a closet floor 90%  4. Reach for a small can off a shelf at eye level 100%  5. Stand on tip toes and reach for something above your head 90%  6. Stand on a chair and reach for something 70%  7. Sweep the floor 100%  8. Walk outside the house to a car parked in the driveway 100%  9. Get into or out of a car 90%  10. Walk across a parking lot to the mall 90%  11. Walk up or down a ramp 90%  12. Walk in a crowded mall where people rapidly walk past you 90%  13. Are bumped into by people as you walk through the mall 80%  14. Step onto or off of an escalator while you are holding onto the railing 90%  15. Step onto or off an escalator while holding onto parcels such that you cannot hold onto the railing 80%  16. Walk outside on icy sidewalks 50%  Total: #/16 87.5%    NDI:  NECK DISABILITY INDEX  Date:  Score - 02/26/2024   Pain intensity 1 = The pain is very mild at the moment  2. Personal care (washing, dressing, etc.) 0 = I can look after myself normally without causing extra pain  3. Lifting 0 =  I can lift heavy weights without extra pain  4. Reading 1 = I can read as much as I want to with slight pain in my neck  5. Headaches 0 = I have no headaches at all  6. Concentration 1 =  I can concentrate fully when I want to with slight difficulty   7. Work 0 =  I can do as much work as I want to  8. Driving 1 =  I can drive my car as long as I want with slight pain in my neck  9. Sleeping 1 = My sleep is slightly disturbed (less than 1 hr sleepless)  10. Recreation 2 = I am able to engage in most, but not all of my usual recreation activities because of   pain in my neck  Total 7/50  % Disability 14.0%   Minimum Detectable Change (90% confidence): 5  points or 10% points  COGNITION: Overall cognitive status: Within functional limits for tasks assessed     SENSATION: WFL  POSTURE:  forward head and rounded, elevated shoulders  PALPATION: Increased muscle tension with mild soreness on palpation (however pt reports he just had a massage yesterday)  CERVICAL ROM:   Active ROM Eval  Flexion 45  Extension 40 p!  Right lateral flexion 16  Left lateral flexion 19  Right rotation 42  Left rotation  48   (Blank rows = not tested)  UPPER EXTREMITY ROM:  B shoulder WFL  UPPER EXTREMITY MMT:  MMT Right eval Left eval  Shoulder flexion 5 5  Shoulder extension 4 4+  Shoulder abduction 4 4  Shoulder adduction    Shoulder internal rotation 4+ 4+  Shoulder external rotation 4+ 4+  Middle trapezius 4 4  Lower trapezius 3 3-  (Blank rows = not tested)  MUSCLE LENGTH: Hamstrings: mild tight B ITB:  Piriformis: mild tight B Hip flexors: WFL Quads: mild/mod tight R>L Heelcord:   LOWER EXTREMITY ROM: Grossly WFL  LOWER EXTREMITY MMT:  MMT Right eval Left eval  Hip flexion 4+ 4+  Hip extension 4+ 4+  Hip abduction 4- 4-  Hip adduction 4 4  Hip internal rotation 4+ 4+  Hip external rotation 4+ 4+  Knee flexion 5 5  Knee extension 5 5  Ankle dorsiflexion 4 4+  Ankle plantarflexion 4+ 5  Ankle inversion 4+ 4+  Ankle eversion 4 4   (Blank rows = not tested)  FUNCTIONAL TESTS:  5 times sit to stand: 18.65 sec Timed up and go (TUG): 9.04 sec 10 meter walk test: 8.54 sec Gait speed: 3.84 ft/sec  Functional gait assessment:  FUNCTIONAL GAIT ASSESSMENT  Date:  Score - 02/26/2024   GAIT LEVEL SURFACE Instructions: Walk at your normal speed from here to the next mark (6 m) [20 ft]. (2) Mild impairment - Walks 6 m (20 ft) in less than 7 seconds but greater than 5.5 seconds, uses assistive device, slower speed, mild gait deviations, or deviates 15.24 -25.4 cm (6 -10 in) outside of the 30.48-cm (12-in) walkway width.  2.    CHANGE IN GAIT SPEED Instructions: Begin walking at your normal pace (for 1.5 m [5 ft]). When I tell you go, walk as fast as you can (for 1.5 m [5 ft]). When I tell you slow, walk as slowly as you can (for 1.5 m [5 ft]. (3) Normal - Able to smoothly change walking speed without loss of balance or gait deviation. Shows a significant difference in walking speeds between normal, fast, and slow speeds. Deviates no more than 15.24 cm (6 in) outside of the 30.48-cm (12-in) walkway width.  3.    GAIT WITH HORIZONTAL HEAD TURNS Instructions: Walk from here to the next mark 6 m (20 ft) away. Begin walking at your normal pace. Keep walking straight; after 3 steps, turn your head to the right and keep walking straight while looking to the right. After 3 more steps, turn your head to the left and keep walking straight while looking left. Continue alternating looking right and left. (3) Normal - Performs head turns smoothly with no change in gait. Deviates no more than 15.24 cm (6 in) outside 30.48-cm (12-in) walkway width.  4.   GAIT WITH VERTICAL HEAD TURNS Instructions: Walk from here to the next mark (6 m [20 ft]). Begin walking at your normal pace. Keep walking straight; after 3 steps, tip your head up and keep walking straight while looking up. After 3 more steps, tip your head down, keep walking straight while looking down. Continue  alternating looking up and down every 3 steps until you have completed 2 repetitions in each direction. (3) Normal - Performs head turns with no change in gait. Deviates no more than 15.24 cm (6 in) outside 30.48-cm (12-in) walkway width.  5.  GAIT AND PIVOT TURN Instructions: Begin with walking at your normal pace. When I tell  you, turn and stop, turn as quickly as you can to face the opposite direction and stop. (3) Normal - Pivot turns safely within 3 seconds and stops quickly with no loss of balance  6.   STEP OVER OBSTACLE Instructions: Begin walking at your normal speed.  When you come to the shoe box, step over it, not around it, and keep walking. (2) Mild impairment - Is able to step over one shoe box (11.43 cm [4.5 in] total height) without changing gait speed; no evidence of imbalance.  7.   GAIT WITH NARROW BASE OF SUPPORT Instructions: Walk on the floor with arms folded across the chest, feet aligned heel to toe in tandem for a distance of 3.6 m [12 ft]. The number of steps taken in a straight line are counted for a maximum of 10 steps. (1) Moderate impairment - Ambulates 4 -7 steps.  8.   GAIT WITH EYES CLOSED Instructions: Walk at your normal speed from here to the next mark (6 m [20 ft]) with your eyes closed. (1) Moderate impairment - Walks 6 m (20 ft), slow speed, abnormal gait pattern, evidence for imbalance, deviates 25.4 -38.1 cm (10 -15 in) outside 30.48-cm (12-in) walkway width. Requires more than 9 seconds to ambulate 6 m (20 ft).  9.   AMBULATING BACKWARDS Instructions: Walk backwards until I tell you to stop (2) Mild impairment - Walks 6 m (20 ft), uses assistive device, slower speed, mild gait deviations, deviates 15.24 -25.4 cm (6 -10 in) outside 30.48-cm (12-in) walkway width  10. STEPS Instructions: Walk up these stairs as you would at home (ie, using the rail if necessary). At the top turn around and walk down. (2) Mild impairment-Alternating feet, must use rail.  Total 22/30  Interpretation: 19-24 = medium risk fall    Interpretation of scores: Non-Specific Older Adults Cutoff Score: <=22/30 = risk of falls Parkinsons Disease Cutoff score <15/30= fall risk (Hoehn & Yahr 1-4)  Minimally Clinically Important Difference (MCID)  Stroke (acute, subacute, and chronic) = MDC: 4.2 points Vestibular (acute) = MDC: 6 points Community Dwelling Older Adults =  MCID: 4 points Parkinsons Disease  =  MDC: 4.3 points  (Academy of Neu[employer]ic Physical Therapy (nd). Functional Gait Assessment. Retrieved from  https://www.neuropt.org/docs/default-source/cpgs/core-outcome-measures/function-gait-assessment-pocket-guide-proof9-(2).pdf?sfvrsn=b60f35043_0.) SLS: R = 30.94 sec, L = 30.50 sec  BED MOBILITY:  Independent  TRANSFERS: Assistive device utilized: None  Sit to stand: Complete Independence Stand to sit: Complete Independence Chair to chair: Complete Independence Floor: NT  GAIT: Distance walked: clinic distances Assistive device utilized: None Level of assistance: Complete Independence Gait pattern: WFL  STAIRS:  Level of Assistance: Modified independence  Stair Negotiation Technique: Alternating Pattern Forwards with Single Rail on Right on descent only  Number of Stairs: 14   Height of Stairs: 7    TODAY'S TREATMENT: 03/04/23 Nustep L4x13min UE/LE Seated cervical extension with pillowcase for support 10x5 Seated cervical rotation SNAG B with pillowcase 10x5  Standing rows GTB 2x10 Standing shoulder extension GTB 2x10 Standing chin tuck with ball on wall 10x5 Standing back to doorframe B shoulder ER GTB 2x10 Standing hip abduction RTB at ankles 2x10 Standing RTB hip ABD/extension diagonal x 10 B Sidestepping along counter RTB at ankles 2x each direction  02/28/2024  THERAPEUTIC EXERCISE: To improve strength, endurance, ROM, and flexibility.  Demonstration, verbal and tactile cues throughout for technique.  NuStep - L4 x 6' (UE/LE) Seated cervical retraction 10 x 5 Backward shoulder rolls x 10 Seated scapular retraction 10 x 5 Seated  UT stretch 2 x 30 B Seated LS stretch 2 x 30 B  NEUROMUSCULAR RE-EDUCATION: To improve strength, coordination, kinesthesia, posture, proprioception, and balance. Seated scapular retraction + GTB B shoulder ER 2 x 10 - pt demonstrating tendency for R shoulder shrug, therefore moved him into chair with pool noodle along spine to provide tactile cues for scapular activation Standing scapular retraction + GTB B row 2 x 10 Standing scapular  retraction + GTB B shoulder extension 2 x 10 Standing RTB hip ABD 2 x 10 B Standing RTB hip ABD/extension diagonal x 10 B   02/26/2024 - Eval SELF CARE:  Reviewed eval findings and role of PT in addressing identified deficits as well as review of his personal HEP and instruction in initial HEP (see below).    PATIENT EDUCATION:  Education details: HEP review, HEP update - postural and hip strengthening, and postural awareness  Person educated: Patient Education method: Explanation, Demonstration, Verbal cues, Tactile cues, and Handouts Education comprehension: verbalized understanding, returned demonstration, verbal cues required, tactile cues required, and needs further education  HOME EXERCISE PROGRAM: Access Code: [email or web address] URL: [email or web address] Date: 02/28/2024 Prepared by: Elijah Hidden  Exercises - Seated Cervical Retraction  - 2 x daily - 7 x weekly - 2 sets - 10 reps - 3-5 sec hold - Standing Backward Shoulder Rolls  - 2 x daily - 7 x weekly - 2 sets - 10 reps - 3 sec hold - Seated Scapular Retraction  - 2 x daily - 7 x weekly - 2 sets - 10 reps - 3-5 sec hold - Seated Upper Trapezius Stretch  - 2 x daily - 7 x weekly - 3 reps - 30 sec hold - Gentle Levator Scapulae Stretch  - 2 x daily - 7 x weekly - 3 reps - 30 sec hold - Standing Bilateral Low Shoulder Row with Anchored Resistance  - 1 x daily - 7 x weekly - 2 sets - 10 reps - 5 sec hold - Standing Hip Abduction with Resistance at Ankles and Counter Support  - 1 x daily - 7 x weekly - 2 sets - 10 reps - 3 sec hold - Diagonal Hip Extension with Resistance  - 1 x daily - 7 x weekly - 2 sets - 10 reps - 3 sec hold   ASSESSMENT:  CLINICAL IMPRESSION: Pt arrives with no new complaints today. He was abe to complete all interventions w/o increased pain. Cues required throughout visit for postural correction and correction of exercises. Will continue progressing postural strengthening and proximal LE strengthening  to tolerance. Signe will benefit from continued skilled PT to address ongoing postural ROM/flexibility, strength and balance deficits to improve mobility and activity tolerance with decreased pain interference and decreased risk for falls.    EVAL: Bradley Peterson is a 84 y.o. male who was referred to physical therapy for evaluation and treatment for cervical OA, disequilibrium and weakness.   Patient reports chronic intermittent neck pain related to cervical OA originating back in the 1980s.  He is typically able to manage the neck pain with exercises has has been given in the past. Patient has deficits in cervical ROM, postural and proximal LE flexibility, UE and LE strength, abnormal posture, and TTP with abnormal muscle tension which are interfering with ADLs and are impacting quality of life.  On NDI patient scored 7/50 demonstrating 14% disability.  His biggest concern is his balance and fear of falling.  Gait speed of 3.84 ft/sec, (2.62 ft/sec is needed  for community access) and TUG of 9.04 sec (>13.5 sec indicates increased risk for falls) are WNL, however examination revealed patient is at risk for falls and functional decline as evidenced by the following objective test measures: and 5xSTS of 18.65 sec (>15 sec indicates increased risk for falls and decreased BLE power) and FGA score of 22/30 (19-24 = medium risk for falls). ABC scale score of 87.5% indicates a high level of physical functioning.  Shandell Jallow will benefit from skilled PT to address above deficits to improve mobility and activity tolerance to help reach the maximal level of functional independence and mobility, with decreased risk for falls. Patient demonstrates understanding of this POC and is in agreement with this plan.    OBJECTIVE IMPAIRMENTS: decreased balance, decreased knowledge of condition, decreased ROM, decreased strength, decreased safety awareness, increased fascial restrictions, impaired perceived functional ability,  increased muscle spasms, impaired flexibility, improper body mechanics, postural dysfunction, and pain.   ACTIVITY LIMITATIONS: sleeping, locomotion level, and caring for others  PARTICIPATION LIMITATIONS: driving, community activity, and yard work  PERSONAL FACTORS: Age, Past/current experiences, Time since onset of injury/illness/exacerbation, and 3+ comorbidities: OA, R BBB, atypical chest pain are also affecting patient's functional outcome.   REHAB POTENTIAL: Excellent  CLINICAL DECISION MAKING: Evolving/moderate complexity  EVALUATION COMPLEXITY: Moderate   GOALS: Goals reviewed with patient? Yes  SHORT TERM GOALS: Target date: 03/25/2024  Patient will be independent with initial HEP. Baseline: HEP initiated on eval Goal status: IN PROGRESS - 02/28/24 - HEP reviewed and updated  2.  Patient will improve 5x STS time to </= 15 seconds to demonstrate improved functional strength and transfer efficiency. Baseline: 18.65 Goal status: INITIAL  LONG TERM GOALS: Target date: 04/22/2024  Patient will be independent with advanced/ongoing HEP to improve outcomes and carryover.  Baseline:  Goal status: INITIAL  2.  Patient will report at least 50% improvement in neck pain to improve QOL. Baseline: 2/10 Goal status: INITIAL  3.  Patient to demonstrate ability to achieve and maintain good spinal alignment/posturing and body mechanics needed for daily activities. Baseline:  Goal status: INITIAL  4.  Patient will demonstrate improved cervical AROM to [facility] to allow for improved safety with checking blind spot while driving. Baseline: Refer to above cervical ROM table Goal status: INITIAL  5.  Patient will demonstrate improved B UE and postural strength to >/= 4+/5 for functional UE use. Baseline: Refer to above UE MMT table Goal status: INITIAL  6.  Patient will demonstrate improved B LE strength to >/= 4+/5 for improved stability and ease of mobility. Baseline: Refer to above LE MMT  table Goal status: INITIAL  7.  Patient will report >/= 92% on ABC scale to demonstrate improved balance confidence. Baseline: 1400 / 1600 = 87.5 % Goal status: INITIAL  8.  Patient will report </= 8% on NDI to demonstrate improved functional ability. Baseline: 7 / 50 = 14.0 % Goal status: INITIAL  9.  Patient will demonstrate at least 26/30 on FGA to decrease risk of falls. Baseline: 22/30 Goal status: INITIAL    PLAN:  PT FREQUENCY: 2x/week  PT DURATION: 8 weeks  PLANNED INTERVENTIONS: [id number]- PT Re-evaluation, 97750- Physical Performance Testing, 97110-Therapeutic exercises, 97530- Therapeutic activity, [id number]- Neuromuscular re-education, 97535- Self Care, [id number]- Manual therapy, [id number]- Gait training, [id number]- Electrical stimulation (unattended), 97035- Ultrasound, 79439 (1-2 muscles), 20561 (3+ muscles)- Dry Needling, Patient/Family education, Balance training, Taping, Joint mobilization, Spinal mobilization, Cryotherapy, and Moist heat  PLAN FOR NEXT SESSION: postural awareness; progress postural  and core/LE strengthening, review & update HEP as indicated; balance training   Sol LITTIE Gaskins, PTA 03/03/2024, 9:27 AM  "

## 2024-03-06 ENCOUNTER — Ambulatory Visit

## 2024-03-06 DIAGNOSIS — R2681 Unsteadiness on feet: Secondary | ICD-10-CM

## 2024-03-06 DIAGNOSIS — M6281 Muscle weakness (generalized): Secondary | ICD-10-CM

## 2024-03-06 DIAGNOSIS — R293 Abnormal posture: Secondary | ICD-10-CM

## 2024-03-06 NOTE — Therapy (Signed)
 " OUTPATIENT PHYSICAL THERAPY TREATMENT   Patient Name: Bradley Peterson MRN: [id number] DOB:1940/06/17, 84 y.o., male Today's Date: 03/06/2024  END OF SESSION:  PT End of Session - 03/06/24 0910     Visit Number 4    Date for Recertification  04/22/24    Authorization Type [employer]    Progress Note Due on Visit 10    PT Start Time 0848    PT Stop Time 0927    PT Time Calculation (min) 39 min    Activity Tolerance Patient tolerated treatment well    Behavior During Therapy [facility] for tasks assessed/performed             Past Medical History:  Diagnosis Date   Allergic state 12/27/2013   Allergy    Arthritis of neck    Heart murmur    Positive TB test    Past Surgical History:  Procedure Laterality Date   COLONOSCOPY  01/2011   DENTAL SURGERY     TONSILLECTOMY     Late 1940s   UPPER GASTROINTESTINAL ENDOSCOPY  01/2011   Patient Active Problem List   Diagnosis Date Noted   Allergic contact dermatitis due to plants, except food 11/26/2022   Hyperglycemia 02/04/2022   Neck pain 07/11/2020   H/O colonoscopy with polypectomy 01/11/2020   OA (osteoarthritis) of knee 06/19/2017   Preventative health care 01/06/2017   Right bundle branch block 03/13/2016   Medicare annual wellness visit, subsequent 12/27/2013   Allergy 12/27/2013   Abnormal echocardiogram 01/07/2013   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   Positive PPD, 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   Internal hemorrhoid 02/25/2012   Atypical chest pain 02/25/2012   Anemia 02/25/2012    PCP: Domenica Harlene LABOR, MD   REFERRING PROVIDER: Domenica Harlene LABOR, MD   REFERRING DIAG:  R73.9 (ICD-10-CM) - Hyperglycemia  M47.812 (ICD-10-CM) - Osteoarthritis of cervical spine, unspecified spinal osteoarthritis complication status  R42 (ICD-10-CM) - Disequilibrium  R53.1 (ICD-10-CM) - Weakness   THERAPY DIAG:  Unsteadiness on feet  Abnormal posture  Muscle weakness (generalized)  RATIONALE FOR EVALUATION AND TREATMENT:  Rehabilitation  ONSET DATE: January 2025 for balance issues, chronic neck pain/OA since 1980s  NEXT MD VISIT: 08/10/2024   SUBJECTIVE:   SUBJECTIVE STATEMENT: Pt denies pain today, feel like he is benefiting from therapy  EVAL:  Pt reports he initially went to see PCP for sinus drainage issues that have led to stomach issues.  He also noted issues with his balance to his MD, which is biggest his concern.  He denies any falls but is very careful to avoid circumstances where he might fall.  He has OA in his neck which was first diagnosed several years ago.  He was given exercises at the time that he uses when his neck bothers him.  Also goes to a massage therapist ~1x/mo to help with his neck.  He notes some weakness in his arms primarily.  On rare occasion he will have tingling feeling down into forearms.  PAIN: Are you having pain? Yes: NPRS scale: 0/10 most of the time Pain location: B lower neck/upper shoulders  Pain description: intermittent, dull ache  Aggravating factors: nothing specific  Relieving factors: massage therapy, exercises, heat wrap, Icy/Hot   PERTINENT HISTORY: OA, R BBB, atypical chest pain  PRECAUTIONS: None  HAND DOMINANCE: Right  RED FLAGS: None  WEIGHT BEARING RESTRICTIONS: No  FALLS:  Has patient fallen in last 6 months? No  LIVING ENVIRONMENT: Lives with: lives with their spouse Lives in:  House Stairs: Yes: External: 8 steps; on left going up Has following equipment at home: Single point cane  OCCUPATION: Retired  PLOF: Independent and Leisure: yardwork, walk 2 miles - 4x/wk  PATIENT GOALS: Figure out balance. Also work better with shoulders including better strength with lifting.   OBJECTIVE: (objective measures completed at initial evaluation unless otherwise dated)  DIAGNOSTIC FINDINGS:  None available  PATIENT SURVEYS:  ABC scale: The Activities-Specific Balance Confidence (ABC) Scale 0% 10 20 30  40 50 60 70 80 90 100% No  confidence<->completely confident  How confident are you that you will not lose your balance or become unsteady when you . . .  Date tested 02/26/2024   Walk around the house 100%  2. Walk up or down stairs 90%  3. Bend over and pick up a slipper from in front of a closet floor 90%  4. Reach for a small can off a shelf at eye level 100%  5. Stand on tip toes and reach for something above your head 90%  6. Stand on a chair and reach for something 70%  7. Sweep the floor 100%  8. Walk outside the house to a car parked in the driveway 100%  9. Get into or out of a car 90%  10. Walk across a parking lot to the mall 90%  11. Walk up or down a ramp 90%  12. Walk in a crowded mall where people rapidly walk past you 90%  13. Are bumped into by people as you walk through the mall 80%  14. Step onto or off of an escalator while you are holding onto the railing 90%  15. Step onto or off an escalator while holding onto parcels such that you cannot hold onto the railing 80%  16. Walk outside on icy sidewalks 50%  Total: #/16 87.5%    NDI:  NECK DISABILITY INDEX  Date:  Score - 02/26/2024   Pain intensity 1 = The pain is very mild at the moment  2. Personal care (washing, dressing, etc.) 0 = I can look after myself normally without causing extra pain  3. Lifting 0 =  I can lift heavy weights without extra pain  4. Reading 1 = I can read as much as I want to with slight pain in my neck  5. Headaches 0 = I have no headaches at all  6. Concentration 1 =  I can concentrate fully when I want to with slight difficulty   7. Work 0 =  I can do as much work as I want to  8. Driving 1 =  I can drive my car as long as I want with slight pain in my neck  9. Sleeping 1 = My sleep is slightly disturbed (less than 1 hr sleepless)  10. Recreation 2 = I am able to engage in most, but not all of my usual recreation activities because of   pain in my neck  Total 7/50  % Disability 14.0%   Minimum Detectable Change  (90% confidence): 5 points or 10% points  COGNITION: Overall cognitive status: Within functional limits for tasks assessed     SENSATION: WFL  POSTURE:  forward head and rounded, elevated shoulders  PALPATION: Increased muscle tension with mild soreness on palpation (however pt reports he just had a massage yesterday)  CERVICAL ROM:   Active ROM Eval  Flexion 45  Extension 40 p!  Right lateral flexion 16  Left lateral flexion 19  Right rotation  42  Left rotation 48   (Blank rows = not tested)  UPPER EXTREMITY ROM:  B shoulder WFL  UPPER EXTREMITY MMT:  MMT Right eval Left eval  Shoulder flexion 5 5  Shoulder extension 4 4+  Shoulder abduction 4 4  Shoulder adduction    Shoulder internal rotation 4+ 4+  Shoulder external rotation 4+ 4+  Middle trapezius 4 4  Lower trapezius 3 3-  (Blank rows = not tested)  MUSCLE LENGTH: Hamstrings: mild tight B ITB:  Piriformis: mild tight B Hip flexors: WFL Quads: mild/mod tight R>L Heelcord:   LOWER EXTREMITY ROM: Grossly WFL  LOWER EXTREMITY MMT:  MMT Right eval Left eval  Hip flexion 4+ 4+  Hip extension 4+ 4+  Hip abduction 4- 4-  Hip adduction 4 4  Hip internal rotation 4+ 4+  Hip external rotation 4+ 4+  Knee flexion 5 5  Knee extension 5 5  Ankle dorsiflexion 4 4+  Ankle plantarflexion 4+ 5  Ankle inversion 4+ 4+  Ankle eversion 4 4   (Blank rows = not tested)  FUNCTIONAL TESTS:  5 times sit to stand: 18.65 sec Timed up and go (TUG): 9.04 sec 10 meter walk test: 8.54 sec Gait speed: 3.84 ft/sec  Functional gait assessment:  FUNCTIONAL GAIT ASSESSMENT  Date:  Score - 02/26/2024   GAIT LEVEL SURFACE Instructions: Walk at your normal speed from here to the next mark (6 m) [20 ft]. (2) Mild impairment - Walks 6 m (20 ft) in less than 7 seconds but greater than 5.5 seconds, uses assistive device, slower speed, mild gait deviations, or deviates 15.24 -25.4 cm (6 -10 in) outside of the 30.48-cm (12-in)  walkway width.  2.   CHANGE IN GAIT SPEED Instructions: Begin walking at your normal pace (for 1.5 m [5 ft]). When I tell you go, walk as fast as you can (for 1.5 m [5 ft]). When I tell you slow, walk as slowly as you can (for 1.5 m [5 ft]. (3) Normal - Able to smoothly change walking speed without loss of balance or gait deviation. Shows a significant difference in walking speeds between normal, fast, and slow speeds. Deviates no more than 15.24 cm (6 in) outside of the 30.48-cm (12-in) walkway width.  3.    GAIT WITH HORIZONTAL HEAD TURNS Instructions: Walk from here to the next mark 6 m (20 ft) away. Begin walking at your normal pace. Keep walking straight; after 3 steps, turn your head to the right and keep walking straight while looking to the right. After 3 more steps, turn your head to the left and keep walking straight while looking left. Continue alternating looking right and left. (3) Normal - Performs head turns smoothly with no change in gait. Deviates no more than 15.24 cm (6 in) outside 30.48-cm (12-in) walkway width.  4.   GAIT WITH VERTICAL HEAD TURNS Instructions: Walk from here to the next mark (6 m [20 ft]). Begin walking at your normal pace. Keep walking straight; after 3 steps, tip your head up and keep walking straight while looking up. After 3 more steps, tip your head down, keep walking straight while looking down. Continue  alternating looking up and down every 3 steps until you have completed 2 repetitions in each direction. (3) Normal - Performs head turns with no change in gait. Deviates no more than 15.24 cm (6 in) outside 30.48-cm (12-in) walkway width.  5.  GAIT AND PIVOT TURN Instructions: Begin with walking at your normal  pace. When I tell you, turn and stop, turn as quickly as you can to face the opposite direction and stop. (3) Normal - Pivot turns safely within 3 seconds and stops quickly with no loss of balance  6.   STEP OVER OBSTACLE Instructions: Begin walking  at your normal speed. When you come to the shoe box, step over it, not around it, and keep walking. (2) Mild impairment - Is able to step over one shoe box (11.43 cm [4.5 in] total height) without changing gait speed; no evidence of imbalance.  7.   GAIT WITH NARROW BASE OF SUPPORT Instructions: Walk on the floor with arms folded across the chest, feet aligned heel to toe in tandem for a distance of 3.6 m [12 ft]. The number of steps taken in a straight line are counted for a maximum of 10 steps. (1) Moderate impairment - Ambulates 4 -7 steps.  8.   GAIT WITH EYES CLOSED Instructions: Walk at your normal speed from here to the next mark (6 m [20 ft]) with your eyes closed. (1) Moderate impairment - Walks 6 m (20 ft), slow speed, abnormal gait pattern, evidence for imbalance, deviates 25.4 -38.1 cm (10 -15 in) outside 30.48-cm (12-in) walkway width. Requires more than 9 seconds to ambulate 6 m (20 ft).  9.   AMBULATING BACKWARDS Instructions: Walk backwards until I tell you to stop (2) Mild impairment - Walks 6 m (20 ft), uses assistive device, slower speed, mild gait deviations, deviates 15.24 -25.4 cm (6 -10 in) outside 30.48-cm (12-in) walkway width  10. STEPS Instructions: Walk up these stairs as you would at home (ie, using the rail if necessary). At the top turn around and walk down. (2) Mild impairment-Alternating feet, must use rail.  Total 22/30  Interpretation: 19-24 = medium risk fall    Interpretation of scores: Non-Specific Older Adults Cutoff Score: <=22/30 = risk of falls Parkinsons Disease Cutoff score <15/30= fall risk (Hoehn & Yahr 1-4)  Minimally Clinically Important Difference (MCID)  Stroke (acute, subacute, and chronic) = MDC: 4.2 points Vestibular (acute) = MDC: 6 points Community Dwelling Older Adults =  MCID: 4 points Parkinsons Disease  =  MDC: 4.3 points  (Academy of Neu[employer]ic Physical Therapy (nd). Functional Gait Assessment. Retrieved from  https://www.neuropt.org/docs/default-source/cpgs/core-outcome-measures/function-gait-assessment-pocket-guide-proof9-(2).pdf?sfvrsn=b49f35043_0.) SLS: R = 30.94 sec, L = 30.50 sec  BED MOBILITY:  Independent  TRANSFERS: Assistive device utilized: None  Sit to stand: Complete Independence Stand to sit: Complete Independence Chair to chair: Complete Independence Floor: NT  GAIT: Distance walked: clinic distances Assistive device utilized: None Level of assistance: Complete Independence Gait pattern: WFL  STAIRS:  Level of Assistance: Modified independence  Stair Negotiation Technique: Alternating Pattern Forwards with Single Rail on Right on descent only  Number of Stairs: 14   Height of Stairs: 7    TODAY'S TREATMENT: 03/07/23 Nustep L5x91min UE/LE 5xSTS assessed HEP update  Functional squats 2x10 Sidestepping along counter RTB at ankles 4x each direction Standing hip abduction RTB at ankles 2x10  Standing RTB hip ABD/extension diagonal 2x10 B Seated rows 20lb 2x12 low grips Lat pull 20lb 2x10 Tandem gait 4x15 ft Retro gait 4x38ft Braiding 2x84ft  03/04/23 Nustep L4x49min UE/LE Seated cervical extension with pillowcase for support 10x5 Seated cervical rotation SNAG B with pillowcase 10x5  Standing rows GTB 2x10 Standing shoulder extension GTB 2x10 Standing chin tuck with ball on wall 10x5 Standing back to doorframe B shoulder ER GTB 2x10 Standing hip abduction RTB at ankles 2x10 Standing RTB hip  ABD/extension diagonal x 10 B Sidestepping along counter RTB at ankles 2x each direction  02/28/2024  THERAPEUTIC EXERCISE: To improve strength, endurance, ROM, and flexibility.  Demonstration, verbal and tactile cues throughout for technique.  NuStep - L4 x 6' (UE/LE) Seated cervical retraction 10 x 5 Backward shoulder rolls x 10 Seated scapular retraction 10 x 5 Seated UT stretch 2 x 30 B Seated LS stretch 2 x 30 B  NEUROMUSCULAR RE-EDUCATION: To improve  strength, coordination, kinesthesia, posture, proprioception, and balance. Seated scapular retraction + GTB B shoulder ER 2 x 10 - pt demonstrating tendency for R shoulder shrug, therefore moved him into chair with pool noodle along spine to provide tactile cues for scapular activation Standing scapular retraction + GTB B row 2 x 10 Standing scapular retraction + GTB B shoulder extension 2 x 10 Standing RTB hip ABD 2 x 10 B Standing RTB hip ABD/extension diagonal x 10 B   02/26/2024 - Eval SELF CARE:  Reviewed eval findings and role of PT in addressing identified deficits as well as review of his personal HEP and instruction in initial HEP (see below).    PATIENT EDUCATION:  Education details: HEP review, HEP update - added tandem gait and braiding along counter, and postural awareness  Person educated: Patient Education method: Explanation, Demonstration, Verbal cues, Tactile cues, and Handouts Education comprehension: verbalized understanding, returned demonstration, verbal cues required, tactile cues required, and needs further education  HOME EXERCISE PROGRAM: Access Code: [email or web address] URL: [email or web address] Date: 03/06/2024 Prepared by: Dravin Lance  Exercises - Seated Cervical Retraction  - 2 x daily - 7 x weekly - 2 sets - 10 reps - 3-5 sec hold - Standing Backward Shoulder Rolls  - 2 x daily - 7 x weekly - 2 sets - 10 reps - 3 sec hold - Seated Scapular Retraction  - 2 x daily - 7 x weekly - 2 sets - 10 reps - 3-5 sec hold - Seated Upper Trapezius Stretch  - 2 x daily - 7 x weekly - 3 reps - 30 sec hold - Gentle Levator Scapulae Stretch  - 2 x daily - 7 x weekly - 3 reps - 30 sec hold - Standing Bilateral Low Shoulder Row with Anchored Resistance  - 1 x daily - 7 x weekly - 2 sets - 10 reps - 5 sec hold - Standing Hip Abduction with Resistance at Ankles and Counter Support  - 1 x daily - 7 x weekly - 2 sets - 10 reps - 3 sec hold - Diagonal Hip Extension with  Resistance  - 1 x daily - 7 x weekly - 2 sets - 10 reps - 3 sec hold - Tandem Walking with Counter Support  - 1 x daily - 7 x weekly - 2-3 sets - 10 reps - Backward Tandem Walking with Counter Support  - 1 x daily - 7 x weekly - 2-3 sets - 10 reps - Carioca with Counter Support  - 1 x daily - 7 x weekly - 2-3 sets - 10 reps   ASSESSMENT:  CLINICAL IMPRESSION: Pt arrives with no new complaints today. He was abe to complete all interventions w/o increased pain. We added some more balance interventions and added these exercises to his HEP. Also incorporated gym exercises for postural strengthening. He did slightly improve his 5xSTS score today as well. Signe will benefit from continued skilled PT to address ongoing postural ROM/flexibility, strength and balance deficits to improve mobility and activity tolerance with decreased pain  interference and decreased risk for falls.    EVAL: AHARON CARRIERE is a 84 y.o. male who was referred to physical therapy for evaluation and treatment for cervical OA, disequilibrium and weakness.   Patient reports chronic intermittent neck pain related to cervical OA originating back in the 1980s.  He is typically able to manage the neck pain with exercises has has been given in the past. Patient has deficits in cervical ROM, postural and proximal LE flexibility, UE and LE strength, abnormal posture, and TTP with abnormal muscle tension which are interfering with ADLs and are impacting quality of life.  On NDI patient scored 7/50 demonstrating 14% disability.  His biggest concern is his balance and fear of falling.  Gait speed of 3.84 ft/sec, (2.62 ft/sec is needed for community access) and TUG of 9.04 sec (>13.5 sec indicates increased risk for falls) are WNL, however examination revealed patient is at risk for falls and functional decline as evidenced by the following objective test measures: and 5xSTS of 18.65 sec (>15 sec indicates increased risk for falls and decreased BLE  power) and FGA score of 22/30 (19-24 = medium risk for falls). ABC scale score of 87.5% indicates a high level of physical functioning.  Brandt Chaney will benefit from skilled PT to address above deficits to improve mobility and activity tolerance to help reach the maximal level of functional independence and mobility, with decreased risk for falls. Patient demonstrates understanding of this POC and is in agreement with this plan.    OBJECTIVE IMPAIRMENTS: decreased balance, decreased knowledge of condition, decreased ROM, decreased strength, decreased safety awareness, increased fascial restrictions, impaired perceived functional ability, increased muscle spasms, impaired flexibility, improper body mechanics, postural dysfunction, and pain.   ACTIVITY LIMITATIONS: sleeping, locomotion level, and caring for others  PARTICIPATION LIMITATIONS: driving, community activity, and yard work  PERSONAL FACTORS: Age, Past/current experiences, Time since onset of injury/illness/exacerbation, and 3+ comorbidities: OA, R BBB, atypical chest pain are also affecting patient's functional outcome.   REHAB POTENTIAL: Excellent  CLINICAL DECISION MAKING: Evolving/moderate complexity  EVALUATION COMPLEXITY: Moderate   GOALS: Goals reviewed with patient? Yes  SHORT TERM GOALS: Target date: 03/25/2024  Patient will be independent with initial HEP. Baseline: HEP initiated on eval Goal status: IN PROGRESS - 02/28/24 - HEP reviewed and updated  2.  Patient will improve 5x STS time to </= 15 seconds to demonstrate improved functional strength and transfer efficiency. Baseline: 18.65 Goal status: IN PROGRESS- 03/07/23- 18.57 sec   LONG TERM GOALS: Target date: 04/22/2024  Patient will be independent with advanced/ongoing HEP to improve outcomes and carryover.  Baseline:  Goal status: INITIAL  2.  Patient will report at least 50% improvement in neck pain to improve QOL. Baseline: 2/10 Goal status: INITIAL  3.   Patient to demonstrate ability to achieve and maintain good spinal alignment/posturing and body mechanics needed for daily activities. Baseline:  Goal status: INITIAL  4.  Patient will demonstrate improved cervical AROM to [facility] to allow for improved safety with checking blind spot while driving. Baseline: Refer to above cervical ROM table Goal status: INITIAL  5.  Patient will demonstrate improved B UE and postural strength to >/= 4+/5 for functional UE use. Baseline: Refer to above UE MMT table Goal status: INITIAL  6.  Patient will demonstrate improved B LE strength to >/= 4+/5 for improved stability and ease of mobility. Baseline: Refer to above LE MMT table Goal status: INITIAL  7.  Patient will report >/= 92% on  ABC scale to demonstrate improved balance confidence. Baseline: 1400 / 1600 = 87.5 % Goal status: INITIAL  8.  Patient will report </= 8% on NDI to demonstrate improved functional ability. Baseline: 7 / 50 = 14.0 % Goal status: INITIAL  9.  Patient will demonstrate at least 26/30 on FGA to decrease risk of falls. Baseline: 22/30 Goal status: INITIAL    PLAN:  PT FREQUENCY: 2x/week  PT DURATION: 8 weeks  PLANNED INTERVENTIONS: 97164- PT Re-evaluation, 97750- Physical Performance Testing, 97110-Therapeutic exercises, 97530- Therapeutic activity, 97112- Neuromuscular re-education, 97535- Self Care, [id number]- Manual therapy, [phone]- Gait training, [phone]- Electrical stimulation (unattended), 97035- Ultrasound, 79439 (1-2 muscles), 20561 (3+ muscles)- Dry Needling, Patient/Family education, Balance training, Taping, Joint mobilization, Spinal mobilization, Cryotherapy, and Moist heat  PLAN FOR NEXT SESSION: postural awareness; progress postural and core/LE strengthening, review & update HEP as indicated; balance training   Sol LITTIE Gaskins, PTA 03/06/2024, 9:29 AM  "

## 2024-03-10 ENCOUNTER — Encounter: Payer: Self-pay | Admitting: Physical Therapy

## 2024-03-10 ENCOUNTER — Ambulatory Visit: Admitting: Physical Therapy

## 2024-03-10 DIAGNOSIS — R2681 Unsteadiness on feet: Secondary | ICD-10-CM

## 2024-03-10 DIAGNOSIS — R293 Abnormal posture: Secondary | ICD-10-CM

## 2024-03-10 DIAGNOSIS — M6281 Muscle weakness (generalized): Secondary | ICD-10-CM

## 2024-03-10 NOTE — Therapy (Signed)
 " OUTPATIENT PHYSICAL THERAPY TREATMENT   Patient Name: DJ SENTENO MRN: [id number] DOB:11-03-1940, 84 y.o., male Today's Date: 03/10/2024  END OF SESSION:  PT End of Session - 03/10/24 0847     Visit Number 5    Date for Recertification  04/22/24    Authorization Type [employer]    Progress Note Due on Visit 10    PT Start Time 0847    PT Stop Time 0929    PT Time Calculation (min) 42 min    Activity Tolerance Patient tolerated treatment well    Behavior During Therapy [facility] for tasks assessed/performed              Past Medical History:  Diagnosis Date   Allergic state 12/27/2013   Allergy    Arthritis of neck    Heart murmur    Positive TB test    Past Surgical History:  Procedure Laterality Date   COLONOSCOPY  01/2011   DENTAL SURGERY     TONSILLECTOMY     Late 1940s   UPPER GASTROINTESTINAL ENDOSCOPY  01/2011   Patient Active Problem List   Diagnosis Date Noted   Allergic contact dermatitis due to plants, except food 11/26/2022   Hyperglycemia 02/04/2022   Neck pain 07/11/2020   H/O colonoscopy with polypectomy 01/11/2020   OA (osteoarthritis) of knee 06/19/2017   Preventative health care 01/06/2017   Right bundle branch block 03/13/2016   Medicare annual wellness visit, subsequent 12/27/2013   Allergy 12/27/2013   Abnormal echocardiogram 01/07/2013   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   Positive PPD, 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   Internal hemorrhoid 02/25/2012   Atypical chest pain 02/25/2012   Anemia 02/25/2012    PCP: Domenica Harlene LABOR, MD   REFERRING PROVIDER: Domenica Harlene LABOR, MD   REFERRING DIAG:  R73.9 (ICD-10-CM) - Hyperglycemia  M47.812 (ICD-10-CM) - Osteoarthritis of cervical spine, unspecified spinal osteoarthritis complication status  R42 (ICD-10-CM) - Disequilibrium  R53.1 (ICD-10-CM) - Weakness   THERAPY DIAG:  Unsteadiness on feet  Abnormal posture  Muscle weakness (generalized)  RATIONALE FOR EVALUATION AND TREATMENT:  Rehabilitation  ONSET DATE: January 2025 for balance issues, chronic neck pain/OA since 1980s  NEXT MD VISIT: 08/10/2024   SUBJECTIVE:   SUBJECTIVE STATEMENT: Pt reports he feels like he has the HEP down and denies any questions or concerns.  EVAL:  Pt reports he initially went to see PCP for sinus drainage issues that have led to stomach issues.  He also noted issues with his balance to his MD, which is biggest his concern.  He denies any falls but is very careful to avoid circumstances where he might fall.  He has OA in his neck which was first diagnosed several years ago.  He was given exercises at the time that he uses when his neck bothers him.  Also goes to a massage therapist ~1x/mo to help with his neck.  He notes some weakness in his arms primarily.  On rare occasion he will have tingling feeling down into forearms.  PAIN: Are you having pain? Yes: NPRS scale: 0/10 most of the time Pain location: B lower neck/upper shoulders   PERTINENT HISTORY: OA, R BBB, atypical chest pain  PRECAUTIONS: None  HAND DOMINANCE: Right  RED FLAGS: None  WEIGHT BEARING RESTRICTIONS: No  FALLS:  Has patient fallen in last 6 months? No  LIVING ENVIRONMENT: Lives with: lives with their spouse Lives in: House Stairs: Yes: External: 8 steps; on left going up Has following equipment at  home: Single point cane  OCCUPATION: Retired  PLOF: Independent and Leisure: yardwork, walk 2 miles - 4x/wk  PATIENT GOALS: Figure out balance. Also work better with shoulders including better strength with lifting.   OBJECTIVE: (objective measures completed at initial evaluation unless otherwise dated)  DIAGNOSTIC FINDINGS:  None available  PATIENT SURVEYS:  ABC scale: The Activities-Specific Balance Confidence (ABC) Scale 0% 10 20 30  40 50 60 70 80 90 100% No confidence<->completely confident  How confident are you that you will not lose your balance or become unsteady when you . . .  Date  tested 02/26/2024   Walk around the house 100%  2. Walk up or down stairs 90%  3. Bend over and pick up a slipper from in front of a closet floor 90%  4. Reach for a small can off a shelf at eye level 100%  5. Stand on tip toes and reach for something above your head 90%  6. Stand on a chair and reach for something 70%  7. Sweep the floor 100%  8. Walk outside the house to a car parked in the driveway 100%  9. Get into or out of a car 90%  10. Walk across a parking lot to the mall 90%  11. Walk up or down a ramp 90%  12. Walk in a crowded mall where people rapidly walk past you 90%  13. Are bumped into by people as you walk through the mall 80%  14. Step onto or off of an escalator while you are holding onto the railing 90%  15. Step onto or off an escalator while holding onto parcels such that you cannot hold onto the railing 80%  16. Walk outside on icy sidewalks 50%  Total: #/16 87.5%    NDI:  NECK DISABILITY INDEX  Date:  Score - 02/26/2024   Pain intensity 1 = The pain is very mild at the moment  2. Personal care (washing, dressing, etc.) 0 = I can look after myself normally without causing extra pain  3. Lifting 0 =  I can lift heavy weights without extra pain  4. Reading 1 = I can read as much as I want to with slight pain in my neck  5. Headaches 0 = I have no headaches at all  6. Concentration 1 =  I can concentrate fully when I want to with slight difficulty   7. Work 0 =  I can do as much work as I want to  8. Driving 1 =  I can drive my car as long as I want with slight pain in my neck  9. Sleeping 1 = My sleep is slightly disturbed (less than 1 hr sleepless)  10. Recreation 2 = I am able to engage in most, but not all of my usual recreation activities because of   pain in my neck  Total 7/50  % Disability 14.0%   Minimum Detectable Change (90% confidence): 5 points or 10% points  COGNITION: Overall cognitive status: Within functional limits for tasks  assessed     SENSATION: WFL  POSTURE:  forward head and rounded, elevated shoulders  PALPATION: Increased muscle tension with mild soreness on palpation (however pt reports he just had a massage yesterday)  CERVICAL ROM:   Active ROM Eval  Flexion 45  Extension 40 p!  Right lateral flexion 16  Left lateral flexion 19  Right rotation 42  Left rotation 48   (Blank rows = not tested)  UPPER  EXTREMITY ROM:  B shoulder WFL  UPPER EXTREMITY MMT:  MMT Right eval Left eval  Shoulder flexion 5 5  Shoulder extension 4 4+  Shoulder abduction 4 4  Shoulder adduction    Shoulder internal rotation 4+ 4+  Shoulder external rotation 4+ 4+  Middle trapezius 4 4  Lower trapezius 3 3-  (Blank rows = not tested)  MUSCLE LENGTH: Hamstrings: mild tight B ITB:  Piriformis: mild tight B Hip flexors: WFL Quads: mild/mod tight R>L Heelcord:   LOWER EXTREMITY ROM: Grossly WFL  LOWER EXTREMITY MMT:  MMT Right eval Left eval  Hip flexion 4+ 4+  Hip extension 4+ 4+  Hip abduction 4- 4-  Hip adduction 4 4  Hip internal rotation 4+ 4+  Hip external rotation 4+ 4+  Knee flexion 5 5  Knee extension 5 5  Ankle dorsiflexion 4 4+  Ankle plantarflexion 4+ 5  Ankle inversion 4+ 4+  Ankle eversion 4 4   (Blank rows = not tested)  FUNCTIONAL TESTS:  5 times sit to stand: 18.65 sec Timed up and go (TUG): 9.04 sec 10 meter walk test: 8.54 sec Gait speed: 3.84 ft/sec  Functional gait assessment:  FUNCTIONAL GAIT ASSESSMENT  Date:  Score - 02/26/2024   GAIT LEVEL SURFACE Instructions: Walk at your normal speed from here to the next mark (6 m) [20 ft]. (2) Mild impairment - Walks 6 m (20 ft) in less than 7 seconds but greater than 5.5 seconds, uses assistive device, slower speed, mild gait deviations, or deviates 15.24 -25.4 cm (6 -10 in) outside of the 30.48-cm (12-in) walkway width.  2.   CHANGE IN GAIT SPEED Instructions: Begin walking at your normal pace (for 1.5 m [5 ft]). When I  tell you go, walk as fast as you can (for 1.5 m [5 ft]). When I tell you slow, walk as slowly as you can (for 1.5 m [5 ft]. (3) Normal - Able to smoothly change walking speed without loss of balance or gait deviation. Shows a significant difference in walking speeds between normal, fast, and slow speeds. Deviates no more than 15.24 cm (6 in) outside of the 30.48-cm (12-in) walkway width.  3.    GAIT WITH HORIZONTAL HEAD TURNS Instructions: Walk from here to the next mark 6 m (20 ft) away. Begin walking at your normal pace. Keep walking straight; after 3 steps, turn your head to the right and keep walking straight while looking to the right. After 3 more steps, turn your head to the left and keep walking straight while looking left. Continue alternating looking right and left. (3) Normal - Performs head turns smoothly with no change in gait. Deviates no more than 15.24 cm (6 in) outside 30.48-cm (12-in) walkway width.  4.   GAIT WITH VERTICAL HEAD TURNS Instructions: Walk from here to the next mark (6 m [20 ft]). Begin walking at your normal pace. Keep walking straight; after 3 steps, tip your head up and keep walking straight while looking up. After 3 more steps, tip your head down, keep walking straight while looking down. Continue  alternating looking up and down every 3 steps until you have completed 2 repetitions in each direction. (3) Normal - Performs head turns with no change in gait. Deviates no more than 15.24 cm (6 in) outside 30.48-cm (12-in) walkway width.  5.  GAIT AND PIVOT TURN Instructions: Begin with walking at your normal pace. When I tell you, turn and stop, turn as quickly as you can  to face the opposite direction and stop. (3) Normal - Pivot turns safely within 3 seconds and stops quickly with no loss of balance  6.   STEP OVER OBSTACLE Instructions: Begin walking at your normal speed. When you come to the shoe box, step over it, not around it, and keep walking. (2) Mild impairment  - Is able to step over one shoe box (11.43 cm [4.5 in] total height) without changing gait speed; no evidence of imbalance.  7.   GAIT WITH NARROW BASE OF SUPPORT Instructions: Walk on the floor with arms folded across the chest, feet aligned heel to toe in tandem for a distance of 3.6 m [12 ft]. The number of steps taken in a straight line are counted for a maximum of 10 steps. (1) Moderate impairment - Ambulates 4 -7 steps.  8.   GAIT WITH EYES CLOSED Instructions: Walk at your normal speed from here to the next mark (6 m [20 ft]) with your eyes closed. (1) Moderate impairment - Walks 6 m (20 ft), slow speed, abnormal gait pattern, evidence for imbalance, deviates 25.4 -38.1 cm (10 -15 in) outside 30.48-cm (12-in) walkway width. Requires more than 9 seconds to ambulate 6 m (20 ft).  9.   AMBULATING BACKWARDS Instructions: Walk backwards until I tell you to stop (2) Mild impairment - Walks 6 m (20 ft), uses assistive device, slower speed, mild gait deviations, deviates 15.24 -25.4 cm (6 -10 in) outside 30.48-cm (12-in) walkway width  10. STEPS Instructions: Walk up these stairs as you would at home (ie, using the rail if necessary). At the top turn around and walk down. (2) Mild impairment-Alternating feet, must use rail.  Total 22/30  Interpretation: 19-24 = medium risk fall    Interpretation of scores: Non-Specific Older Adults Cutoff Score: <=22/30 = risk of falls Parkinsons Disease Cutoff score <15/30= fall risk (Hoehn & Yahr 1-4)  Minimally Clinically Important Difference (MCID)  Stroke (acute, subacute, and chronic) = MDC: 4.2 points Vestibular (acute) = MDC: 6 points Community Dwelling Older Adults =  MCID: 4 points Parkinsons Disease  =  MDC: 4.3 points  (Academy of Neurologic Physical Therapy (nd). Functional Gait Assessment. Retrieved from  https://www.neuropt.org/docs/default-source/cpgs/core-outcome-measures/function-gait-assessment-pocket-guide-proof9-(2).pdf?sfvrsn=b39f35043_0.) SLS: R = 30.94 sec, L = 30.50 sec  BED MOBILITY:  Independent  TRANSFERS: Assistive device utilized: None  Sit to stand: Complete Independence Stand to sit: Complete Independence Chair to chair: Complete Independence Floor: NT  GAIT: Distance walked: clinic distances Assistive device utilized: None Level of assistance: Complete Independence Gait pattern: WFL  STAIRS:  Level of Assistance: Modified independence  Stair Negotiation Technique: Alternating Pattern Forwards with Single Rail on Right on descent only  Number of Stairs: 14   Height of Stairs: 7    TODAY'S TREATMENT:   03/10/2024  THERAPEUTIC EXERCISE: To improve strength and endurance.  Demonstration, verbal and tactile cues throughout for technique.  NuStep - L5 x 6' (UE/LE)  NEUROMUSCULAR RE-EDUCATION: To improve strength, coordination, kinesthesia, posture, proprioception, balance, and reduce fall risk. B scap retraction + depression with GTB draped over shoulders and crossed behind back 2 x 10 Standing back to doorframe - GTB B scap retraction + shoulder horiz ABD x 10 Standing back to doorframe - GTB B scap retraction + shoulder horiz ABD diagonals x 10 B Lower trap setting at wall: V wall slide x 10 V wall slide + slight lift-off at top of motion x 10 V wall slide with looped YTB at wrists + slight lift-off at top of  motionx 10 SLS + 7-way toe tap to colored dots x 3 cycles B SLS + 8-way toe tap to colored dots x 3 cycles B Standing on Airex pad: Lateral weight shift x 10 Ant/post weight shift x 10 March in place x 20 Mini-squat 2 x 10 - pt noted to shift to R, therefore provided mirror feedback to focus on even weight STS from mat table w/o UE assist x 10   03/07/23 Nustep L5x30min UE/LE 5xSTS assessed HEP update  Functional squats 2x10 Sidestepping along  counter RTB at ankles 4x each direction Standing hip abduction RTB at ankles 2x10  Standing RTB hip ABD/extension diagonal 2x10 B Seated rows 20lb 2x12 low grips Lat pull 20lb 2x10 Tandem gait 4x15 ft Retro gait 4x60ft Braiding 2x33ft   03/04/23 Nustep L4x58min UE/LE Seated cervical extension with pillowcase for support 10x5 Seated cervical rotation SNAG B with pillowcase 10x5  Standing rows GTB 2x10 Standing shoulder extension GTB 2x10 Standing chin tuck with ball on wall 10x5 Standing back to doorframe B shoulder ER GTB 2x10 Standing hip abduction RTB at ankles 2x10 Standing RTB hip ABD/extension diagonal x 10 B Sidestepping along counter RTB at ankles 2x each direction   02/28/2024  THERAPEUTIC EXERCISE: To improve strength, endurance, ROM, and flexibility.  Demonstration, verbal and tactile cues throughout for technique.  NuStep - L4 x 6' (UE/LE) Seated cervical retraction 10 x 5 Backward shoulder rolls x 10 Seated scapular retraction 10 x 5 Seated UT stretch 2 x 30 B Seated LS stretch 2 x 30 B  NEUROMUSCULAR RE-EDUCATION: To improve strength, coordination, kinesthesia, posture, proprioception, and balance. Seated scapular retraction + GTB B shoulder ER 2 x 10 - pt demonstrating tendency for R shoulder shrug, therefore moved him into chair with pool noodle along spine to provide tactile cues for scapular activation Standing scapular retraction + GTB B row 2 x 10 Standing scapular retraction + GTB B shoulder extension 2 x 10 Standing RTB hip ABD 2 x 10 B Standing RTB hip ABD/extension diagonal x 10 B   02/26/2024 - Eval SELF CARE:  Reviewed eval findings and role of PT in addressing identified deficits as well as review of his personal HEP and instruction in initial HEP (see below).    PATIENT EDUCATION:  Education details: HEP review, HEP update - added tandem gait and braiding along counter, and postural awareness  Person educated: Patient Education method:  Explanation, Demonstration, Verbal cues, Tactile cues, and Handouts Education comprehension: verbalized understanding, returned demonstration, verbal cues required, tactile cues required, and needs further education  HOME EXERCISE PROGRAM: Access Code: [email or web address] URL: [email or web address] Date: 03/06/2024 Prepared by: Braylin Clark  Exercises - Seated Cervical Retraction  - 2 x daily - 7 x weekly - 2 sets - 10 reps - 3-5 sec hold - Standing Backward Shoulder Rolls  - 2 x daily - 7 x weekly - 2 sets - 10 reps - 3 sec hold - Seated Scapular Retraction  - 2 x daily - 7 x weekly - 2 sets - 10 reps - 3-5 sec hold - Seated Upper Trapezius Stretch  - 2 x daily - 7 x weekly - 3 reps - 30 sec hold - Gentle Levator Scapulae Stretch  - 2 x daily - 7 x weekly - 3 reps - 30 sec hold - Standing Bilateral Low Shoulder Row with Anchored Resistance  - 1 x daily - 7 x weekly - 2 sets - 10 reps - 5 sec hold - Standing Hip Abduction with  Resistance at Ankles and Counter Support  - 1 x daily - 7 x weekly - 2 sets - 10 reps - 3 sec hold - Diagonal Hip Extension with Resistance  - 1 x daily - 7 x weekly - 2 sets - 10 reps - 3 sec hold - Tandem Walking with Counter Support  - 1 x daily - 7 x weekly - 2-3 sets - 10 reps - Backward Tandem Walking with Counter Support  - 1 x daily - 7 x weekly - 2-3 sets - 10 reps - Carioca with Counter Support  - 1 x daily - 7 x weekly - 2-3 sets - 10 reps   ASSESSMENT:  CLINICAL IMPRESSION: Terry Bolotin denies any concerns with or need for review of initial HEP - STG #1 met.  He notes improving cervical ROM/flexibility with decreasing tension observed in B upper shoulders although he still persists with forward head and rounded shoulder posture.  Continued progression of postural strengthening emphasizing middle and lower traps for better coordination of scapular motion and improve postural muscle engagement.  Balance activities targeting multidirectional movement  patterns as well as balance on unstable surfaces with no significant LOB observed although limited coordination of movements particularly with SLS clocks.  Axon will benefit from continued skilled PT to address ongoing postural ROM/flexibility, strength and balance deficits to improve mobility and activity tolerance with decreased pain interference and decreased risk for falls.    EVAL: TALLY MCKINNON is a 84 y.o. male who was referred to physical therapy for evaluation and treatment for cervical OA, disequilibrium and weakness.   Patient reports chronic intermittent neck pain related to cervical OA originating back in the 1980s.  He is typically able to manage the neck pain with exercises has has been given in the past. Patient has deficits in cervical ROM, postural and proximal LE flexibility, UE and LE strength, abnormal posture, and TTP with abnormal muscle tension which are interfering with ADLs and are impacting quality of life.  On NDI patient scored 7/50 demonstrating 14% disability.  His biggest concern is his balance and fear of falling.  Gait speed of 3.84 ft/sec, (2.62 ft/sec is needed for community access) and TUG of 9.04 sec (>13.5 sec indicates increased risk for falls) are WNL, however examination revealed patient is at risk for falls and functional decline as evidenced by the following objective test measures: and 5xSTS of 18.65 sec (>15 sec indicates increased risk for falls and decreased BLE power) and FGA score of 22/30 (19-24 = medium risk for falls). ABC scale score of 87.5% indicates a high level of physical functioning.  Nihar Klus will benefit from skilled PT to address above deficits to improve mobility and activity tolerance to help reach the maximal level of functional independence and mobility, with decreased risk for falls. Patient demonstrates understanding of this POC and is in agreement with this plan.    OBJECTIVE IMPAIRMENTS: decreased balance, decreased knowledge of  condition, decreased ROM, decreased strength, decreased safety awareness, increased fascial restrictions, impaired perceived functional ability, increased muscle spasms, impaired flexibility, improper body mechanics, postural dysfunction, and pain.   ACTIVITY LIMITATIONS: sleeping, locomotion level, and caring for others  PARTICIPATION LIMITATIONS: driving, community activity, and yard work  PERSONAL FACTORS: Age, Past/current experiences, Time since onset of injury/illness/exacerbation, and 3+ comorbidities: OA, R BBB, atypical chest pain are also affecting patient's functional outcome.   REHAB POTENTIAL: Excellent  CLINICAL DECISION MAKING: Evolving/moderate complexity  EVALUATION COMPLEXITY: Moderate   GOALS: Goals reviewed with  patient? Yes  SHORT TERM GOALS: Target date: 03/25/2024  Patient will be independent with initial HEP. Baseline: HEP initiated on eval 02/28/24 - HEP reviewed and updated Goal status: MET - 03/10/24  2.  Patient will improve 5x STS time to </= 15 seconds to demonstrate improved functional strength and transfer efficiency. Baseline: 18.65 Goal status: IN PROGRESS - 03/07/23 - 18.57 sec   LONG TERM GOALS: Target date: 04/22/2024  Patient will be independent with advanced/ongoing HEP to improve outcomes and carryover.  Baseline:  Goal status: INITIAL  2.  Patient will report at least 50% improvement in neck pain to improve QOL. Baseline: 2/10 Goal status: INITIAL  3.  Patient to demonstrate ability to achieve and maintain good spinal alignment/posturing and body mechanics needed for daily activities. Baseline:  Goal status: INITIAL  4.  Patient will demonstrate improved cervical AROM to [facility] to allow for improved safety with checking blind spot while driving. Baseline: Refer to above cervical ROM table Goal status: INITIAL  5.  Patient will demonstrate improved B UE and postural strength to >/= 4+/5 for functional UE use. Baseline: Refer to above UE MMT  table Goal status: INITIAL  6.  Patient will demonstrate improved B LE strength to >/= 4+/5 for improved stability and ease of mobility. Baseline: Refer to above LE MMT table Goal status: INITIAL  7.  Patient will report >/= 92% on ABC scale to demonstrate improved balance confidence. Baseline: 1400 / 1600 = 87.5 % Goal status: INITIAL  8.  Patient will report </= 8% on NDI to demonstrate improved functional ability. Baseline: 7 / 50 = 14.0 % Goal status: INITIAL  9.  Patient will demonstrate at least 26/30 on FGA to decrease risk of falls. Baseline: 22/30 Goal status: INITIAL    PLAN:  PT FREQUENCY: 2x/week  PT DURATION: 8 weeks  PLANNED INTERVENTIONS: [id number]- PT Re-evaluation, 97750- Physical Performance Testing, 97110-Therapeutic exercises, 97530- Therapeutic activity, 97112- Neuromuscular re-education, 97535- Self Care, [id number]- Manual therapy, [phone]- Gait training, [phone]- Electrical stimulation (unattended), 97035- Ultrasound, 79439 (1-2 muscles), [id number] (3+ muscles)- Dry Needling, Patient/Family education, Balance training, Taping, Joint mobilization, Spinal mobilization, Cryotherapy, and Moist heat  PLAN FOR NEXT SESSION: postural awareness; progress postural and core/LE strengthening, review & update HEP as indicated; balance training   Elijah CHRISTELLA Hidden, PT 03/10/2024, 9:32 AM  "

## 2024-03-13 ENCOUNTER — Ambulatory Visit

## 2024-03-13 DIAGNOSIS — R2681 Unsteadiness on feet: Secondary | ICD-10-CM | POA: Diagnosis not present

## 2024-03-13 DIAGNOSIS — R293 Abnormal posture: Secondary | ICD-10-CM

## 2024-03-13 DIAGNOSIS — M6281 Muscle weakness (generalized): Secondary | ICD-10-CM

## 2024-03-13 NOTE — Therapy (Signed)
 " OUTPATIENT PHYSICAL THERAPY TREATMENT   Patient Name: Bradley Peterson MRN: [id number] DOB:[birth year], 84 y.o., male Today's Date: 03/13/2024  END OF SESSION:  PT End of Session - 03/13/24 0852     Visit Number 6    Date for Recertification  04/22/24    Authorization Type [employer]    Progress Note Due on Visit 10    PT Start Time 0847    PT Stop Time 0931    PT Time Calculation (min) 44 min    Activity Tolerance Patient tolerated treatment well    Behavior During Therapy [facility] for tasks assessed/performed               Past Medical History:  Diagnosis Date   Allergic state 12/27/2013   Allergy    Arthritis of neck    Heart murmur    Positive TB test    Past Surgical History:  Procedure Laterality Date   COLONOSCOPY  01/2011   DENTAL SURGERY     TONSILLECTOMY     Late 1940s   UPPER GASTROINTESTINAL ENDOSCOPY  01/2011   Patient Active Problem List   Diagnosis Date Noted   Allergic contact dermatitis due to plants, except food 11/26/2022   Hyperglycemia 02/04/2022   Neck pain 07/11/2020   H/O colonoscopy with polypectomy 01/11/2020   OA (osteoarthritis) of knee 06/19/2017   Preventative health care 01/06/2017   Right bundle branch block 03/13/2016   Medicare annual wellness visit, subsequent 12/27/2013   Allergy 12/27/2013   Abnormal echocardiogram 01/07/2013   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   Positive PPD, 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   Internal hemorrhoid 02/25/2012   Atypical chest pain 02/25/2012   Anemia 02/25/2012    PCP: Domenica Harlene LABOR, MD   REFERRING PROVIDER: Domenica Harlene LABOR, MD   REFERRING DIAG:  R73.9 (ICD-10-CM) - Hyperglycemia  M47.812 (ICD-10-CM) - Osteoarthritis of cervical spine, unspecified spinal osteoarthritis complication status  R42 (ICD-10-CM) - Disequilibrium  R53.1 (ICD-10-CM) - Weakness   THERAPY DIAG:  Unsteadiness on feet  Abnormal posture  Muscle weakness (generalized)  RATIONALE FOR EVALUATION AND TREATMENT:  Rehabilitation  ONSET DATE: January 2025 for balance issues, chronic neck pain/OA since 1980s  NEXT MD VISIT: 08/10/2024   SUBJECTIVE:   SUBJECTIVE STATEMENT: Pt denies pain but he was sore for a bit after his last visit.  EVAL:  Pt reports he initially went to see PCP for sinus drainage issues that have led to stomach issues.  He also noted issues with his balance to his MD, which is biggest his concern.  He denies any falls but is very careful to avoid circumstances where he might fall.  He has OA in his neck which was first diagnosed several years ago.  He was given exercises at the time that he uses when his neck bothers him.  Also goes to a massage therapist ~1x/mo to help with his neck.  He notes some weakness in his arms primarily.  On rare occasion he will have tingling feeling down into forearms.  PAIN: Are you having pain? Yes: NPRS scale: 0/10 most of the time Pain location: B lower neck/upper shoulders   PERTINENT HISTORY: OA, R BBB, atypical chest pain  PRECAUTIONS: None  HAND DOMINANCE: Right  RED FLAGS: None  WEIGHT BEARING RESTRICTIONS: No  FALLS:  Has patient fallen in last 6 months? No  LIVING ENVIRONMENT: Lives with: lives with their spouse Lives in: House Stairs: Yes: External: 8 steps; on left going up Has following equipment at home:  Single point cane  OCCUPATION: Retired  PLOF: Independent and Leisure: yardwork, walk 2 miles - 4x/wk  PATIENT GOALS: Figure out balance. Also work better with shoulders including better strength with lifting.   OBJECTIVE: (objective measures completed at initial evaluation unless otherwise dated)  DIAGNOSTIC FINDINGS:  None available  PATIENT SURVEYS:  ABC scale: The Activities-Specific Balance Confidence (ABC) Scale 0% 10 20 30  40 50 60 70 80 90 100% No confidence<->completely confident  How confident are you that you will not lose your balance or become unsteady when you . . .  Date tested 02/26/2024    Walk around the house 100%  2. Walk up or down stairs 90%  3. Bend over and pick up a slipper from in front of a closet floor 90%  4. Reach for a small can off a shelf at eye level 100%  5. Stand on tip toes and reach for something above your head 90%  6. Stand on a chair and reach for something 70%  7. Sweep the floor 100%  8. Walk outside the house to a car parked in the driveway 100%  9. Get into or out of a car 90%  10. Walk across a parking lot to the mall 90%  11. Walk up or down a ramp 90%  12. Walk in a crowded mall where people rapidly walk past you 90%  13. Are bumped into by people as you walk through the mall 80%  14. Step onto or off of an escalator while you are holding onto the railing 90%  15. Step onto or off an escalator while holding onto parcels such that you cannot hold onto the railing 80%  16. Walk outside on icy sidewalks 50%  Total: #/16 87.5%    NDI:  NECK DISABILITY INDEX  Date:  Score - 02/26/2024   Pain intensity 1 = The pain is very mild at the moment  2. Personal care (washing, dressing, etc.) 0 = I can look after myself normally without causing extra pain  3. Lifting 0 =  I can lift heavy weights without extra pain  4. Reading 1 = I can read as much as I want to with slight pain in my neck  5. Headaches 0 = I have no headaches at all  6. Concentration 1 =  I can concentrate fully when I want to with slight difficulty   7. Work 0 =  I can do as much work as I want to  8. Driving 1 =  I can drive my car as long as I want with slight pain in my neck  9. Sleeping 1 = My sleep is slightly disturbed (less than 1 hr sleepless)  10. Recreation 2 = I am able to engage in most, but not all of my usual recreation activities because of   pain in my neck  Total 7/50  % Disability 14.0%   Minimum Detectable Change (90% confidence): 5 points or 10% points  COGNITION: Overall cognitive status: Within functional limits for tasks  assessed     SENSATION: WFL  POSTURE:  forward head and rounded, elevated shoulders  PALPATION: Increased muscle tension with mild soreness on palpation (however pt reports he just had a massage yesterday)  CERVICAL ROM:   Active ROM Eval  Flexion 45  Extension 40 p!  Right lateral flexion 16  Left lateral flexion 19  Right rotation 42  Left rotation 48   (Blank rows = not tested)  UPPER EXTREMITY  ROM:  B shoulder WFL  UPPER EXTREMITY MMT:  MMT Right eval Left eval  Shoulder flexion 5 5  Shoulder extension 4 4+  Shoulder abduction 4 4  Shoulder adduction    Shoulder internal rotation 4+ 4+  Shoulder external rotation 4+ 4+  Middle trapezius 4 4  Lower trapezius 3 3-  (Blank rows = not tested)  MUSCLE LENGTH: Hamstrings: mild tight B ITB:  Piriformis: mild tight B Hip flexors: WFL Quads: mild/mod tight R>L Heelcord:   LOWER EXTREMITY ROM: Grossly WFL  LOWER EXTREMITY MMT:  MMT Right eval Left eval  Hip flexion 4+ 4+  Hip extension 4+ 4+  Hip abduction 4- 4-  Hip adduction 4 4  Hip internal rotation 4+ 4+  Hip external rotation 4+ 4+  Knee flexion 5 5  Knee extension 5 5  Ankle dorsiflexion 4 4+  Ankle plantarflexion 4+ 5  Ankle inversion 4+ 4+  Ankle eversion 4 4   (Blank rows = not tested)  FUNCTIONAL TESTS:  5 times sit to stand: 18.65 sec Timed up and go (TUG): 9.04 sec 10 meter walk test: 8.54 sec Gait speed: 3.84 ft/sec  Functional gait assessment:  FUNCTIONAL GAIT ASSESSMENT  Date:  Score - 02/26/2024   GAIT LEVEL SURFACE Instructions: Walk at your normal speed from here to the next mark (6 m) [20 ft]. (2) Mild impairment - Walks 6 m (20 ft) in less than 7 seconds but greater than 5.5 seconds, uses assistive device, slower speed, mild gait deviations, or deviates 15.24 -25.4 cm (6 -10 in) outside of the 30.48-cm (12-in) walkway width.  2.   CHANGE IN GAIT SPEED Instructions: Begin walking at your normal pace (for 1.5 m [5 ft]). When I  tell you go, walk as fast as you can (for 1.5 m [5 ft]). When I tell you slow, walk as slowly as you can (for 1.5 m [5 ft]. (3) Normal - Able to smoothly change walking speed without loss of balance or gait deviation. Shows a significant difference in walking speeds between normal, fast, and slow speeds. Deviates no more than 15.24 cm (6 in) outside of the 30.48-cm (12-in) walkway width.  3.    GAIT WITH HORIZONTAL HEAD TURNS Instructions: Walk from here to the next mark 6 m (20 ft) away. Begin walking at your normal pace. Keep walking straight; after 3 steps, turn your head to the right and keep walking straight while looking to the right. After 3 more steps, turn your head to the left and keep walking straight while looking left. Continue alternating looking right and left. (3) Normal - Performs head turns smoothly with no change in gait. Deviates no more than 15.24 cm (6 in) outside 30.48-cm (12-in) walkway width.  4.   GAIT WITH VERTICAL HEAD TURNS Instructions: Walk from here to the next mark (6 m [20 ft]). Begin walking at your normal pace. Keep walking straight; after 3 steps, tip your head up and keep walking straight while looking up. After 3 more steps, tip your head down, keep walking straight while looking down. Continue  alternating looking up and down every 3 steps until you have completed 2 repetitions in each direction. (3) Normal - Performs head turns with no change in gait. Deviates no more than 15.24 cm (6 in) outside 30.48-cm (12-in) walkway width.  5.  GAIT AND PIVOT TURN Instructions: Begin with walking at your normal pace. When I tell you, turn and stop, turn as quickly as you can to  face the opposite direction and stop. (3) Normal - Pivot turns safely within 3 seconds and stops quickly with no loss of balance  6.   STEP OVER OBSTACLE Instructions: Begin walking at your normal speed. When you come to the shoe box, step over it, not around it, and keep walking. (2) Mild impairment  - Is able to step over one shoe box (11.43 cm [4.5 in] total height) without changing gait speed; no evidence of imbalance.  7.   GAIT WITH NARROW BASE OF SUPPORT Instructions: Walk on the floor with arms folded across the chest, feet aligned heel to toe in tandem for a distance of 3.6 m [12 ft]. The number of steps taken in a straight line are counted for a maximum of 10 steps. (1) Moderate impairment - Ambulates 4 -7 steps.  8.   GAIT WITH EYES CLOSED Instructions: Walk at your normal speed from here to the next mark (6 m [20 ft]) with your eyes closed. (1) Moderate impairment - Walks 6 m (20 ft), slow speed, abnormal gait pattern, evidence for imbalance, deviates 25.4 -38.1 cm (10 -15 in) outside 30.48-cm (12-in) walkway width. Requires more than 9 seconds to ambulate 6 m (20 ft).  9.   AMBULATING BACKWARDS Instructions: Walk backwards until I tell you to stop (2) Mild impairment - Walks 6 m (20 ft), uses assistive device, slower speed, mild gait deviations, deviates 15.24 -25.4 cm (6 -10 in) outside 30.48-cm (12-in) walkway width  10. STEPS Instructions: Walk up these stairs as you would at home (ie, using the rail if necessary). At the top turn around and walk down. (2) Mild impairment-Alternating feet, must use rail.  Total 22/30  Interpretation: 19-24 = medium risk fall    Interpretation of scores: Non-Specific Older Adults Cutoff Score: <=22/30 = risk of falls Parkinsons Disease Cutoff score <15/30= fall risk (Hoehn & Yahr 1-4)  Minimally Clinically Important Difference (MCID)  Stroke (acute, subacute, and chronic) = MDC: 4.2 points Vestibular (acute) = MDC: 6 points Community Dwelling Older Adults =  MCID: 4 points Parkinsons Disease  =  MDC: 4.3 points  (Academy of Neu[employer]ic Physical Therapy (nd). Functional Gait Assessment. Retrieved from  https://www.neuropt.org/docs/default-source/cpgs/core-outcome-measures/function-gait-assessment-pocket-guide-proof9-(2).pdf?sfvrsn=b49f35043_0.) SLS: R = 30.94 sec, L = 30.50 sec  BED MOBILITY:  Independent  TRANSFERS: Assistive device utilized: None  Sit to stand: Complete Independence Stand to sit: Complete Independence Chair to chair: Complete Independence Floor: NT  GAIT: Distance walked: clinic distances Assistive device utilized: None Level of assistance: Complete Independence Gait pattern: WFL  STAIRS:  Level of Assistance: Modified independence  Stair Negotiation Technique: Alternating Pattern Forwards with Single Rail on Right on descent only  Number of Stairs: 14   Height of Stairs: 7    TODAY'S TREATMENT: 03/13/24 NuStep - L5 x 6' (UE/LE) Sidelying clamshell RTB 10x5 sec R/L  Standing on Airex pad: Lateral weight shift x 20 Ant/post weight shift x 20 March in place x 20 Mini-squat 2 x 10  Tandem stance on airex 2x30 sec Sidestepping with RTB at ankles 4x20 ft Monster walk RTB at ankles 4x3ft V lower trap YTB looped around wrist 2 x 10 Bridging with RTB hip ABD 15x5 sec  03/10/2024  THERAPEUTIC EXERCISE: To improve strength and endurance.  Demonstration, verbal and tactile cues throughout for technique.  NuStep - L5 x 6' (UE/LE)  NEUROMUSCULAR RE-EDUCATION: To improve strength, coordination, kinesthesia, posture, proprioception, balance, and reduce fall risk. B scap retraction + depression with GTB draped over shoulders and crossed behind  back 2 x 10 Standing back to doorframe - GTB B scap retraction + shoulder horiz ABD x 10 Standing back to doorframe - GTB B scap retraction + shoulder horiz ABD diagonals x 10 B Lower trap setting at wall: V wall slide x 10 V wall slide + slight lift-off at top of motion x 10 V wall slide with looped YTB at wrists + slight lift-off at top of motionx 10 SLS + 7-way toe tap to colored dots x 3 cycles B SLS + 8-way toe  tap to colored dots x 3 cycles B Standing on Airex pad: Lateral weight shift x 10 Ant/post weight shift x 10 March in place x 20 Mini-squat 2 x 10 - pt noted to shift to R, therefore provided mirror feedback to focus on even weight STS from mat table w/o UE assist x 10   03/07/23 Nustep L5x109min UE/LE 5xSTS assessed HEP update  Functional squats 2x10 Sidestepping along counter RTB at ankles 4x each direction Standing hip abduction RTB at ankles 2x10  Standing RTB hip ABD/extension diagonal 2x10 B Seated rows 20lb 2x12 low grips Lat pull 20lb 2x10 Tandem gait 4x15 ft Retro gait 4x49ft Braiding 2x30ft   03/04/23 Nustep L4x34min UE/LE Seated cervical extension with pillowcase for support 10x5 Seated cervical rotation SNAG B with pillowcase 10x5  Standing rows GTB 2x10 Standing shoulder extension GTB 2x10 Standing chin tuck with ball on wall 10x5 Standing back to doorframe B shoulder ER GTB 2x10 Standing hip abduction RTB at ankles 2x10 Standing RTB hip ABD/extension diagonal x 10 B Sidestepping along counter RTB at ankles 2x each direction   02/28/2024  THERAPEUTIC EXERCISE: To improve strength, endurance, ROM, and flexibility.  Demonstration, verbal and tactile cues throughout for technique.  NuStep - L4 x 6' (UE/LE) Seated cervical retraction 10 x 5 Backward shoulder rolls x 10 Seated scapular retraction 10 x 5 Seated UT stretch 2 x 30 B Seated LS stretch 2 x 30 B  NEUROMUSCULAR RE-EDUCATION: To improve strength, coordination, kinesthesia, posture, proprioception, and balance. Seated scapular retraction + GTB B shoulder ER 2 x 10 - pt demonstrating tendency for R shoulder shrug, therefore moved him into chair with pool noodle along spine to provide tactile cues for scapular activation Standing scapular retraction + GTB B row 2 x 10 Standing scapular retraction + GTB B shoulder extension 2 x 10 Standing RTB hip ABD 2 x 10 B Standing RTB hip ABD/extension diagonal x  10 B   02/26/2024 - Eval SELF CARE:  Reviewed eval findings and role of PT in addressing identified deficits as well as review of his personal HEP and instruction in initial HEP (see below).    PATIENT EDUCATION:  Education details: HEP review, HEP update - added tandem gait and braiding along counter, and postural awareness  Person educated: Patient Education method: Explanation, Demonstration, Verbal cues, Tactile cues, and Handouts Education comprehension: verbalized understanding, returned demonstration, verbal cues required, tactile cues required, and needs further education  HOME EXERCISE PROGRAM: Access Code: [email or web address] URL: [email or web address] Date: 03/06/2024 Prepared by: Stephanne Greeley  Exercises - Seated Cervical Retraction  - 2 x daily - 7 x weekly - 2 sets - 10 reps - 3-5 sec hold - Standing Backward Shoulder Rolls  - 2 x daily - 7 x weekly - 2 sets - 10 reps - 3 sec hold - Seated Scapular Retraction  - 2 x daily - 7 x weekly - 2 sets - 10 reps - 3-5 sec hold - Seated Upper  Trapezius Stretch  - 2 x daily - 7 x weekly - 3 reps - 30 sec hold - Gentle Levator Scapulae Stretch  - 2 x daily - 7 x weekly - 3 reps - 30 sec hold - Standing Bilateral Low Shoulder Row with Anchored Resistance  - 1 x daily - 7 x weekly - 2 sets - 10 reps - 5 sec hold - Standing Hip Abduction with Resistance at Ankles and Counter Support  - 1 x daily - 7 x weekly - 2 sets - 10 reps - 3 sec hold - Diagonal Hip Extension with Resistance  - 1 x daily - 7 x weekly - 2 sets - 10 reps - 3 sec hold - Tandem Walking with Counter Support  - 1 x daily - 7 x weekly - 2-3 sets - 10 reps - Backward Tandem Walking with Counter Support  - 1 x daily - 7 x weekly - 2-3 sets - 10 reps - Carioca with Counter Support  - 1 x daily - 7 x weekly - 2-3 sets - 10 reps   ASSESSMENT:  CLINICAL IMPRESSION: Yannick Steuber is doing well with PT, his pain levels are decreasing as far as neck pain. We are working on  more balance activities and postural strengthening as well. Added postural exercises to his HEP, see under HEP section above.  He was able to complete all interventions with no increased pain or limitations. Axon will benefit from continued skilled PT to address ongoing postural ROM/flexibility, strength and balance deficits to improve mobility and activity tolerance with decreased pain interference and decreased risk for falls.    EVAL: MELIK BLANCETT is a 84 y.o. male who was referred to physical therapy for evaluation and treatment for cervical OA, disequilibrium and weakness.   Patient reports chronic intermittent neck pain related to cervical OA originating back in the 1980s.  He is typically able to manage the neck pain with exercises has has been given in the past. Patient has deficits in cervical ROM, postural and proximal LE flexibility, UE and LE strength, abnormal posture, and TTP with abnormal muscle tension which are interfering with ADLs and are impacting quality of life.  On NDI patient scored 7/50 demonstrating 14% disability.  His biggest concern is his balance and fear of falling.  Gait speed of 3.84 ft/sec, (2.62 ft/sec is needed for community access) and TUG of 9.04 sec (>13.5 sec indicates increased risk for falls) are WNL, however examination revealed patient is at risk for falls and functional decline as evidenced by the following objective test measures: and 5xSTS of 18.65 sec (>15 sec indicates increased risk for falls and decreased BLE power) and FGA score of 22/30 (19-24 = medium risk for falls). ABC scale score of 87.5% indicates a high level of physical functioning.  Bern Fare will benefit from skilled PT to address above deficits to improve mobility and activity tolerance to help reach the maximal level of functional independence and mobility, with decreased risk for falls. Patient demonstrates understanding of this POC and is in agreement with this plan.    OBJECTIVE  IMPAIRMENTS: decreased balance, decreased knowledge of condition, decreased ROM, decreased strength, decreased safety awareness, increased fascial restrictions, impaired perceived functional ability, increased muscle spasms, impaired flexibility, improper body mechanics, postural dysfunction, and pain.   ACTIVITY LIMITATIONS: sleeping, locomotion level, and caring for others  PARTICIPATION LIMITATIONS: driving, community activity, and yard work  PERSONAL FACTORS: Age, Past/current experiences, Time since onset of injury/illness/exacerbation, and 3+ comorbidities:  OA, R BBB, atypical chest pain are also affecting patient's functional outcome.   REHAB POTENTIAL: Excellent  CLINICAL DECISION MAKING: Evolving/moderate complexity  EVALUATION COMPLEXITY: Moderate   GOALS: Goals reviewed with patient? Yes  SHORT TERM GOALS: Target date: 03/25/2024  Patient will be independent with initial HEP. Baseline: HEP initiated on eval 02/28/24 - HEP reviewed and updated Goal status: MET - 03/10/24  2.  Patient will improve 5x STS time to </= 15 seconds to demonstrate improved functional strength and transfer efficiency. Baseline: 18.65 Goal status: IN PROGRESS - 03/07/23 - 18.57 sec   LONG TERM GOALS: Target date: 04/22/2024  Patient will be independent with advanced/ongoing HEP to improve outcomes and carryover.  Baseline:  Goal status: INITIAL  2.  Patient will report at least 50% improvement in neck pain to improve QOL. Baseline: 2/10 Goal status: IN PROGRESS- 03/13/24- pt reports his pain is pretty much gone now  3.  Patient to demonstrate ability to achieve and maintain good spinal alignment/posturing and body mechanics needed for daily activities. Baseline:  Goal status: INITIAL  4.  Patient will demonstrate improved cervical AROM to [facility] to allow for improved safety with checking blind spot while driving. Baseline: Refer to above cervical ROM table Goal status: INITIAL  5.  Patient will  demonstrate improved B UE and postural strength to >/= 4+/5 for functional UE use. Baseline: Refer to above UE MMT table Goal status: INITIAL  6.  Patient will demonstrate improved B LE strength to >/= 4+/5 for improved stability and ease of mobility. Baseline: Refer to above LE MMT table Goal status: INITIAL  7.  Patient will report >/= 92% on ABC scale to demonstrate improved balance confidence. Baseline: 1400 / 1600 = 87.5 % Goal status: INITIAL  8.  Patient will report </= 8% on NDI to demonstrate improved functional ability. Baseline: 7 / 50 = 14.0 % Goal status: INITIAL  9.  Patient will demonstrate at least 26/30 on FGA to decrease risk of falls. Baseline: 22/30 Goal status: INITIAL    PLAN:  PT FREQUENCY: 2x/week  PT DURATION: 8 weeks  PLANNED INTERVENTIONS: [id number]- PT Re-evaluation, 97750- Physical Performance Testing, 97110-Therapeutic exercises, 97530- Therapeutic activity, 97112- Neuromuscular re-education, 97535- Self Care, [id number]- Manual therapy, [phone]- Gait training, [phone]- Electrical stimulation (unattended), 97035- Ultrasound, 79439 (1-2 muscles), [id number] (3+ muscles)- Dry Needling, Patient/Family education, Balance training, Taping, Joint mobilization, Spinal mobilization, Cryotherapy, and Moist heat  PLAN FOR NEXT SESSION: postural awareness; progress postural and core/LE strengthening, review & update HEP as indicated; balance training   Sol LITTIE Gaskins, PTA 03/13/2024, 9:48 AM  "

## 2024-03-17 ENCOUNTER — Ambulatory Visit: Admitting: Physical Therapy

## 2024-03-18 ENCOUNTER — Ambulatory Visit

## 2024-03-18 DIAGNOSIS — R293 Abnormal posture: Secondary | ICD-10-CM

## 2024-03-18 DIAGNOSIS — R2681 Unsteadiness on feet: Secondary | ICD-10-CM | POA: Diagnosis not present

## 2024-03-18 DIAGNOSIS — M6281 Muscle weakness (generalized): Secondary | ICD-10-CM

## 2024-03-18 NOTE — Therapy (Signed)
 " OUTPATIENT PHYSICAL THERAPY TREATMENT   Patient Name: Bradley Peterson MRN: [id number] DOB:05/02/1940, 84 y.o., male Today's Date: 03/18/2024  END OF SESSION:  PT End of Session - 03/18/24 0842     Visit Number 7    Date for Recertification  04/22/24    Authorization Type [employer]    Progress Note Due on Visit 10    PT Start Time 0840    PT Stop Time 0934    PT Time Calculation (min) 54 min    Activity Tolerance Patient tolerated treatment well    Behavior During Therapy [facility] for tasks assessed/performed                Past Medical History:  Diagnosis Date   Allergic state 12/27/2013   Allergy    Arthritis of neck    Heart murmur    Positive TB test    Past Surgical History:  Procedure Laterality Date   COLONOSCOPY  01/2011   DENTAL SURGERY     TONSILLECTOMY     Late 1940s   UPPER GASTROINTESTINAL ENDOSCOPY  01/2011   Patient Active Problem List   Diagnosis Date Noted   Allergic contact dermatitis due to plants, except food 11/26/2022   Hyperglycemia 02/04/2022   Neck pain 07/11/2020   H/O colonoscopy with polypectomy 01/11/2020   OA (osteoarthritis) of knee 06/19/2017   Preventative health care 01/06/2017   Right bundle branch block 03/13/2016   Medicare annual wellness visit, subsequent 12/27/2013   Allergy 12/27/2013   Abnormal echocardiogram 01/07/2013   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   Positive PPD, 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   Internal hemorrhoid 02/25/2012   Atypical chest pain 02/25/2012   Anemia 02/25/2012    PCP: Domenica Harlene LABOR, MD   REFERRING PROVIDER: Domenica Harlene LABOR, MD   REFERRING DIAG:  R73.9 (ICD-10-CM) - Hyperglycemia  M47.812 (ICD-10-CM) - Osteoarthritis of cervical spine, unspecified spinal osteoarthritis complication status  R42 (ICD-10-CM) - Disequilibrium  R53.1 (ICD-10-CM) - Weakness   THERAPY DIAG:  Unsteadiness on feet  Abnormal posture  Muscle weakness (generalized)  RATIONALE FOR EVALUATION AND TREATMENT:  Rehabilitation  ONSET DATE: January 2025 for balance issues, chronic neck pain/OA since 1980s  NEXT MD VISIT: 08/10/2024   SUBJECTIVE:   SUBJECTIVE STATEMENT: Pt just notes soreness from shoveling snow in his driveway but no pain.  EVAL:  Pt reports he initially went to see PCP for sinus drainage issues that have led to stomach issues.  He also noted issues with his balance to his MD, which is biggest his concern.  He denies any falls but is very careful to avoid circumstances where he might fall.  He has OA in his neck which was first diagnosed several years ago.  He was given exercises at the time that he uses when his neck bothers him.  Also goes to a massage therapist ~1x/mo to help with his neck.  He notes some weakness in his arms primarily.  On rare occasion he will have tingling feeling down into forearms.  PAIN: Are you having pain? Yes: NPRS scale: 0/10 most of the time Pain location: B lower neck/upper shoulders   PERTINENT HISTORY: OA, R BBB, atypical chest pain  PRECAUTIONS: None  HAND DOMINANCE: Right  RED FLAGS: None  WEIGHT BEARING RESTRICTIONS: No  FALLS:  Has patient fallen in last 6 months? No  LIVING ENVIRONMENT: Lives with: lives with their spouse Lives in: House Stairs: Yes: External: 8 steps; on left going up Has following equipment at home:  Single point cane  OCCUPATION: Retired  PLOF: Independent and Leisure: yardwork, walk 2 miles - 4x/wk  PATIENT GOALS: Figure out balance. Also work better with shoulders including better strength with lifting.   OBJECTIVE: (objective measures completed at initial evaluation unless otherwise dated)  DIAGNOSTIC FINDINGS:  None available  PATIENT SURVEYS:  ABC scale: The Activities-Specific Balance Confidence (ABC) Scale 0% 10 20 30  40 50 60 70 80 90 100% No confidence<->completely confident  How confident are you that you will not lose your balance or become unsteady when you . . .  Date tested  02/26/2024   Walk around the house 100%  2. Walk up or down stairs 90%  3. Bend over and pick up a slipper from in front of a closet floor 90%  4. Reach for a small can off a shelf at eye level 100%  5. Stand on tip toes and reach for something above your head 90%  6. Stand on a chair and reach for something 70%  7. Sweep the floor 100%  8. Walk outside the house to a car parked in the driveway 100%  9. Get into or out of a car 90%  10. Walk across a parking lot to the mall 90%  11. Walk up or down a ramp 90%  12. Walk in a crowded mall where people rapidly walk past you 90%  13. Are bumped into by people as you walk through the mall 80%  14. Step onto or off of an escalator while you are holding onto the railing 90%  15. Step onto or off an escalator while holding onto parcels such that you cannot hold onto the railing 80%  16. Walk outside on icy sidewalks 50%  Total: #/16 87.5%    NDI:  NECK DISABILITY INDEX  Date:  Score - 02/26/2024   Pain intensity 1 = The pain is very mild at the moment  2. Personal care (washing, dressing, etc.) 0 = I can look after myself normally without causing extra pain  3. Lifting 0 =  I can lift heavy weights without extra pain  4. Reading 1 = I can read as much as I want to with slight pain in my neck  5. Headaches 0 = I have no headaches at all  6. Concentration 1 =  I can concentrate fully when I want to with slight difficulty   7. Work 0 =  I can do as much work as I want to  8. Driving 1 =  I can drive my car as long as I want with slight pain in my neck  9. Sleeping 1 = My sleep is slightly disturbed (less than 1 hr sleepless)  10. Recreation 2 = I am able to engage in most, but not all of my usual recreation activities because of   pain in my neck  Total 7/50  % Disability 14.0%   Minimum Detectable Change (90% confidence): 5 points or 10% points  COGNITION: Overall cognitive status: Within functional limits for tasks  assessed     SENSATION: WFL  POSTURE:  forward head and rounded, elevated shoulders  PALPATION: Increased muscle tension with mild soreness on palpation (however pt reports he just had a massage yesterday)  CERVICAL ROM:   Active ROM Eval 03/18/24  Flexion 45 42  Extension 40 p! 40  Right lateral flexion 16 23- mild pain  Left lateral flexion 19 30- mild pain  Right rotation 42 57  Left rotation 48  57   (Blank rows = not tested)  UPPER EXTREMITY ROM:  B shoulder WFL  UPPER EXTREMITY MMT:  MMT Right eval Left eval  Shoulder flexion 5 5  Shoulder extension 4 4+  Shoulder abduction 4 4  Shoulder adduction    Shoulder internal rotation 4+ 4+  Shoulder external rotation 4+ 4+  Middle trapezius 4 4  Lower trapezius 3 3-  (Blank rows = not tested)  MUSCLE LENGTH: Hamstrings: mild tight B ITB:  Piriformis: mild tight B Hip flexors: WFL Quads: mild/mod tight R>L Heelcord:   LOWER EXTREMITY ROM: Grossly WFL  LOWER EXTREMITY MMT:  MMT Right eval Left eval  Hip flexion 4+ 4+  Hip extension 4+ 4+  Hip abduction 4- 4-  Hip adduction 4 4  Hip internal rotation 4+ 4+  Hip external rotation 4+ 4+  Knee flexion 5 5  Knee extension 5 5  Ankle dorsiflexion 4 4+  Ankle plantarflexion 4+ 5  Ankle inversion 4+ 4+  Ankle eversion 4 4   (Blank rows = not tested)  FUNCTIONAL TESTS:  5 times sit to stand: 18.65 sec Timed up and go (TUG): 9.04 sec 10 meter walk test: 8.54 sec Gait speed: 3.84 ft/sec  Functional gait assessment:  FUNCTIONAL GAIT ASSESSMENT  Date:  Score - 02/26/2024   GAIT LEVEL SURFACE Instructions: Walk at your normal speed from here to the next mark (6 m) [20 ft]. (2) Mild impairment - Walks 6 m (20 ft) in less than 7 seconds but greater than 5.5 seconds, uses assistive device, slower speed, mild gait deviations, or deviates 15.24 -25.4 cm (6 -10 in) outside of the 30.48-cm (12-in) walkway width.  2.   CHANGE IN GAIT SPEED Instructions: Begin walking  at your normal pace (for 1.5 m [5 ft]). When I tell you go, walk as fast as you can (for 1.5 m [5 ft]). When I tell you slow, walk as slowly as you can (for 1.5 m [5 ft]. (3) Normal - Able to smoothly change walking speed without loss of balance or gait deviation. Shows a significant difference in walking speeds between normal, fast, and slow speeds. Deviates no more than 15.24 cm (6 in) outside of the 30.48-cm (12-in) walkway width.  3.    GAIT WITH HORIZONTAL HEAD TURNS Instructions: Walk from here to the next mark 6 m (20 ft) away. Begin walking at your normal pace. Keep walking straight; after 3 steps, turn your head to the right and keep walking straight while looking to the right. After 3 more steps, turn your head to the left and keep walking straight while looking left. Continue alternating looking right and left. (3) Normal - Performs head turns smoothly with no change in gait. Deviates no more than 15.24 cm (6 in) outside 30.48-cm (12-in) walkway width.  4.   GAIT WITH VERTICAL HEAD TURNS Instructions: Walk from here to the next mark (6 m [20 ft]). Begin walking at your normal pace. Keep walking straight; after 3 steps, tip your head up and keep walking straight while looking up. After 3 more steps, tip your head down, keep walking straight while looking down. Continue  alternating looking up and down every 3 steps until you have completed 2 repetitions in each direction. (3) Normal - Performs head turns with no change in gait. Deviates no more than 15.24 cm (6 in) outside 30.48-cm (12-in) walkway width.  5.  GAIT AND PIVOT TURN Instructions: Begin with walking at your normal pace. When I tell  you, turn and stop, turn as quickly as you can to face the opposite direction and stop. (3) Normal - Pivot turns safely within 3 seconds and stops quickly with no loss of balance  6.   STEP OVER OBSTACLE Instructions: Begin walking at your normal speed. When you come to the shoe box, step over it, not  around it, and keep walking. (2) Mild impairment - Is able to step over one shoe box (11.43 cm [4.5 in] total height) without changing gait speed; no evidence of imbalance.  7.   GAIT WITH NARROW BASE OF SUPPORT Instructions: Walk on the floor with arms folded across the chest, feet aligned heel to toe in tandem for a distance of 3.6 m [12 ft]. The number of steps taken in a straight line are counted for a maximum of 10 steps. (1) Moderate impairment - Ambulates 4 -7 steps.  8.   GAIT WITH EYES CLOSED Instructions: Walk at your normal speed from here to the next mark (6 m [20 ft]) with your eyes closed. (1) Moderate impairment - Walks 6 m (20 ft), slow speed, abnormal gait pattern, evidence for imbalance, deviates 25.4 -38.1 cm (10 -15 in) outside 30.48-cm (12-in) walkway width. Requires more than 9 seconds to ambulate 6 m (20 ft).  9.   AMBULATING BACKWARDS Instructions: Walk backwards until I tell you to stop (2) Mild impairment - Walks 6 m (20 ft), uses assistive device, slower speed, mild gait deviations, deviates 15.24 -25.4 cm (6 -10 in) outside 30.48-cm (12-in) walkway width  10. STEPS Instructions: Walk up these stairs as you would at home (ie, using the rail if necessary). At the top turn around and walk down. (2) Mild impairment-Alternating feet, must use rail.  Total 22/30  Interpretation: 19-24 = medium risk fall    Interpretation of scores: Non-Specific Older Adults Cutoff Score: <=22/30 = risk of falls Parkinsons Disease Cutoff score <15/30= fall risk (Hoehn & Yahr 1-4)  Minimally Clinically Important Difference (MCID)  Stroke (acute, subacute, and chronic) = MDC: 4.2 points Vestibular (acute) = MDC: 6 points Community Dwelling Older Adults =  MCID: 4 points Parkinsons Disease  =  MDC: 4.3 points  (Academy of Neurologic Physical Therapy (nd). Functional Gait Assessment. Retrieved from  https://www.neuropt.org/docs/default-source/cpgs/core-outcome-measures/function-gait-assessment-pocket-guide-proof9-(2).pdf?sfvrsn=b41f35043_0.) SLS: R = 30.94 sec, L = 30.50 sec  BED MOBILITY:  Independent  TRANSFERS: Assistive device utilized: None  Sit to stand: Complete Independence Stand to sit: Complete Independence Chair to chair: Complete Independence Floor: NT  GAIT: Distance walked: clinic distances Assistive device utilized: None Level of assistance: Complete Independence Gait pattern: WFL  STAIRS:  Level of Assistance: Modified independence  Stair Negotiation Technique: Alternating Pattern Forwards with Single Rail on Right on descent only  Number of Stairs: 14   Height of Stairs: 7    TODAY'S TREATMENT: 03/18/24 NuStep - L5 x 6' (UE/LE) Checked cervical AROM Seated assisted cervical sidebends with pillowcase B 10x3 sec  Seated cerv retraction with pillowcase 10x3 sec Scapular clocks BUE RTB around wrist 5x AL, L, PL Standing shoulder flexion 1lb BUE with chin tuck x 10 Standing shoulder ABD 1lb BUE with chin tuck x 10 Standing hip ADD with RTB at ankle 2x10 B Standing hip ABD with RTB at ankle 2x10 B  Lateral step ups BLE 6 x12 with toe tap to floor Eccentric step downs BLE 6 x 12  Monster walks RTB at ankles 4x40ft  03/13/24 NuStep - L5 x 6' (UE/LE) Sidelying clamshell RTB 10x5 sec R/L  Standing  on Airex pad: Lateral weight shift x 20 Ant/post weight shift x 20 March in place x 20 Mini-squat 2 x 10  Tandem stance on airex 2x30 sec Sidestepping with RTB at ankles 4x20 ft Monster walk RTB at ankles 4x10ft V lower trap YTB looped around wrist 2 x 10 Bridging with RTB hip ABD 15x5 sec  03/10/2024  THERAPEUTIC EXERCISE: To improve strength and endurance.  Demonstration, verbal and tactile cues throughout for technique.  NuStep - L5 x 6' (UE/LE)  NEUROMUSCULAR RE-EDUCATION: To improve strength, coordination, kinesthesia, posture, proprioception,  balance, and reduce fall risk. B scap retraction + depression with GTB draped over shoulders and crossed behind back 2 x 10 Standing back to doorframe - GTB B scap retraction + shoulder horiz ABD x 10 Standing back to doorframe - GTB B scap retraction + shoulder horiz ABD diagonals x 10 B Lower trap setting at wall: V wall slide x 10 V wall slide + slight lift-off at top of motion x 10 V wall slide with looped YTB at wrists + slight lift-off at top of motionx 10 SLS + 7-way toe tap to colored dots x 3 cycles B SLS + 8-way toe tap to colored dots x 3 cycles B Standing on Airex pad: Lateral weight shift x 10 Ant/post weight shift x 10 March in place x 20 Mini-squat 2 x 10 - pt noted to shift to R, therefore provided mirror feedback to focus on even weight STS from mat table w/o UE assist x 10   03/07/23 Nustep L5x65min UE/LE 5xSTS assessed HEP update  Functional squats 2x10 Sidestepping along counter RTB at ankles 4x each direction Standing hip abduction RTB at ankles 2x10  Standing RTB hip ABD/extension diagonal 2x10 B Seated rows 20lb 2x12 low grips Lat pull 20lb 2x10 Tandem gait 4x15 ft Retro gait 4x32ft Braiding 2x69ft   03/04/23 Nustep L4x33min UE/LE Seated cervical extension with pillowcase for support 10x5 Seated cervical rotation SNAG B with pillowcase 10x5  Standing rows GTB 2x10 Standing shoulder extension GTB 2x10 Standing chin tuck with ball on wall 10x5 Standing back to doorframe B shoulder ER GTB 2x10 Standing hip abduction RTB at ankles 2x10 Standing RTB hip ABD/extension diagonal x 10 B Sidestepping along counter RTB at ankles 2x each direction   02/28/2024  THERAPEUTIC EXERCISE: To improve strength, endurance, ROM, and flexibility.  Demonstration, verbal and tactile cues throughout for technique.  NuStep - L4 x 6' (UE/LE) Seated cervical retraction 10 x 5 Backward shoulder rolls x 10 Seated scapular retraction 10 x 5 Seated UT stretch 2 x 30  B Seated LS stretch 2 x 30 B  NEUROMUSCULAR RE-EDUCATION: To improve strength, coordination, kinesthesia, posture, proprioception, and balance. Seated scapular retraction + GTB B shoulder ER 2 x 10 - pt demonstrating tendency for R shoulder shrug, therefore moved him into chair with pool noodle along spine to provide tactile cues for scapular activation Standing scapular retraction + GTB B row 2 x 10 Standing scapular retraction + GTB B shoulder extension 2 x 10 Standing RTB hip ABD 2 x 10 B Standing RTB hip ABD/extension diagonal x 10 B   02/26/2024 - Eval SELF CARE:  Reviewed eval findings and role of PT in addressing identified deficits as well as review of his personal HEP and instruction in initial HEP (see below).    PATIENT EDUCATION:  Education details: HEP review, HEP update - added tandem gait and braiding along counter, and postural awareness  Person educated: Patient Education method: Explanation,  Demonstration, Verbal cues, Tactile cues, and Handouts Education comprehension: verbalized understanding, returned demonstration, verbal cues required, tactile cues required, and needs further education  HOME EXERCISE PROGRAM: Access Code: [email or web address] URL: [email or web address] Date: 03/06/2024 Prepared by: Priya Matsen  Exercises - Seated Cervical Retraction  - 2 x daily - 7 x weekly - 2 sets - 10 reps - 3-5 sec hold - Standing Backward Shoulder Rolls  - 2 x daily - 7 x weekly - 2 sets - 10 reps - 3 sec hold - Seated Scapular Retraction  - 2 x daily - 7 x weekly - 2 sets - 10 reps - 3-5 sec hold - Seated Upper Trapezius Stretch  - 2 x daily - 7 x weekly - 3 reps - 30 sec hold - Gentle Levator Scapulae Stretch  - 2 x daily - 7 x weekly - 3 reps - 30 sec hold - Standing Bilateral Low Shoulder Row with Anchored Resistance  - 1 x daily - 7 x weekly - 2 sets - 10 reps - 5 sec hold - Standing Hip Abduction with Resistance at Ankles and Counter Support  - 1 x daily - 7 x  weekly - 2 sets - 10 reps - 3 sec hold - Diagonal Hip Extension with Resistance  - 1 x daily - 7 x weekly - 2 sets - 10 reps - 3 sec hold - Tandem Walking with Counter Support  - 1 x daily - 7 x weekly - 2-3 sets - 10 reps - Backward Tandem Walking with Counter Support  - 1 x daily - 7 x weekly - 2-3 sets - 10 reps - Carioca with Counter Support  - 1 x daily - 7 x weekly - 2-3 sets - 10 reps   ASSESSMENT:  CLINICAL IMPRESSION: Today we reassessed cervical AROM showing some improvement but he did report mild pain with B side bending and this was the most limited motion, flexion also decreased by a few degrees. Targeted ROM deficits and scapular weakness and shoulder weakness as a focus today and also worked on hip strengthening. Pt responded well to treatment. Signe will benefit from continued skilled PT to address ongoing postural ROM/flexibility, strength and balance deficits to improve mobility and activity tolerance with decreased pain interference and decreased risk for falls.    EVAL: Bradley Peterson is a 84 y.o. male who was referred to physical therapy for evaluation and treatment for cervical OA, disequilibrium and weakness.   Patient reports chronic intermittent neck pain related to cervical OA originating back in the 1980s.  He is typically able to manage the neck pain with exercises has has been given in the past. Patient has deficits in cervical ROM, postural and proximal LE flexibility, UE and LE strength, abnormal posture, and TTP with abnormal muscle tension which are interfering with ADLs and are impacting quality of life.  On NDI patient scored 7/50 demonstrating 14% disability.  His biggest concern is his balance and fear of falling.  Gait speed of 3.84 ft/sec, (2.62 ft/sec is needed for community access) and TUG of 9.04 sec (>13.5 sec indicates increased risk for falls) are WNL, however examination revealed patient is at risk for falls and functional decline as evidenced by the  following objective test measures: and 5xSTS of 18.65 sec (>15 sec indicates increased risk for falls and decreased BLE power) and FGA score of 22/30 (19-24 = medium risk for falls). ABC scale score of 87.5% indicates a high level of physical functioning.  Lynwood Signe  will benefit from skilled PT to address above deficits to improve mobility and activity tolerance to help reach the maximal level of functional independence and mobility, with decreased risk for falls. Patient demonstrates understanding of this POC and is in agreement with this plan.    OBJECTIVE IMPAIRMENTS: decreased balance, decreased knowledge of condition, decreased ROM, decreased strength, decreased safety awareness, increased fascial restrictions, impaired perceived functional ability, increased muscle spasms, impaired flexibility, improper body mechanics, postural dysfunction, and pain.   ACTIVITY LIMITATIONS: sleeping, locomotion level, and caring for others  PARTICIPATION LIMITATIONS: driving, community activity, and yard work  PERSONAL FACTORS: Age, Past/current experiences, Time since onset of injury/illness/exacerbation, and 3+ comorbidities: OA, R BBB, atypical chest pain are also affecting patient's functional outcome.   REHAB POTENTIAL: Excellent  CLINICAL DECISION MAKING: Evolving/moderate complexity  EVALUATION COMPLEXITY: Moderate   GOALS: Goals reviewed with patient? Yes  SHORT TERM GOALS: Target date: 03/25/2024  Patient will be independent with initial HEP. Baseline: HEP initiated on eval 02/28/24 - HEP reviewed and updated Goal status: MET - 03/10/24  2.  Patient will improve 5x STS time to </= 15 seconds to demonstrate improved functional strength and transfer efficiency. Baseline: 18.65 Goal status: IN PROGRESS - 03/07/23 - 18.57 sec   LONG TERM GOALS: Target date: 04/22/2024  Patient will be independent with advanced/ongoing HEP to improve outcomes and carryover.  Baseline:  Goal status:  INITIAL  2.  Patient will report at least 50% improvement in neck pain to improve QOL. Baseline: 2/10 Goal status: IN PROGRESS- 03/13/24- pt reports his pain is pretty much gone now  3.  Patient to demonstrate ability to achieve and maintain good spinal alignment/posturing and body mechanics needed for daily activities. Baseline:  Goal status: INITIAL  4.  Patient will demonstrate improved cervical AROM to [facility] to allow for improved safety with checking blind spot while driving. Baseline: Refer to above cervical ROM table Goal status: IN PROGRESS- 03/18/24- see above table  5.  Patient will demonstrate improved B UE and postural strength to >/= 4+/5 for functional UE use. Baseline: Refer to above UE MMT table Goal status: INITIAL  6.  Patient will demonstrate improved B LE strength to >/= 4+/5 for improved stability and ease of mobility. Baseline: Refer to above LE MMT table Goal status: INITIAL  7.  Patient will report >/= 92% on ABC scale to demonstrate improved balance confidence. Baseline: 1400 / 1600 = 87.5 % Goal status: INITIAL  8.  Patient will report </= 8% on NDI to demonstrate improved functional ability. Baseline: 7 / 50 = 14.0 % Goal status: INITIAL  9.  Patient will demonstrate at least 26/30 on FGA to decrease risk of falls. Baseline: 22/30 Goal status: INITIAL    PLAN:  PT FREQUENCY: 2x/week  PT DURATION: 8 weeks  PLANNED INTERVENTIONS: [id number]- PT Re-evaluation, 97750- Physical Performance Testing, 97110-Therapeutic exercises, 97530- Therapeutic activity, 97112- Neuromuscular re-education, 97535- Self Care, [id number]- Manual therapy, [phone]- Gait training, [phone]- Electrical stimulation (unattended), 97035- Ultrasound, [id number] (1-2 muscles), [id number] (3+ muscles)- Dry Needling, Patient/Family education, Balance training, Taping, Joint mobilization, Spinal mobilization, Cryotherapy, and Moist heat  PLAN FOR NEXT SESSION: progress postural and core/LE strengthening; dynamic  balance training- maybe on unsteady surface   Justine Dines L Paden Senger, PTA 03/18/2024, 9:57 AM  "

## 2024-03-20 ENCOUNTER — Ambulatory Visit

## 2024-03-20 DIAGNOSIS — M6281 Muscle weakness (generalized): Secondary | ICD-10-CM

## 2024-03-20 DIAGNOSIS — R293 Abnormal posture: Secondary | ICD-10-CM

## 2024-03-20 DIAGNOSIS — R2681 Unsteadiness on feet: Secondary | ICD-10-CM

## 2024-03-24 ENCOUNTER — Ambulatory Visit: Admitting: Physical Therapy

## 2024-03-24 ENCOUNTER — Encounter: Payer: Self-pay | Admitting: Physical Therapy

## 2024-03-24 DIAGNOSIS — R293 Abnormal posture: Secondary | ICD-10-CM

## 2024-03-24 DIAGNOSIS — M6281 Muscle weakness (generalized): Secondary | ICD-10-CM

## 2024-03-24 DIAGNOSIS — R2681 Unsteadiness on feet: Secondary | ICD-10-CM

## 2024-03-27 ENCOUNTER — Ambulatory Visit: Admitting: Physical Therapy

## 2024-03-27 DIAGNOSIS — M6281 Muscle weakness (generalized): Secondary | ICD-10-CM

## 2024-03-27 DIAGNOSIS — R2681 Unsteadiness on feet: Secondary | ICD-10-CM

## 2024-03-27 DIAGNOSIS — R293 Abnormal posture: Secondary | ICD-10-CM

## 2024-03-27 NOTE — Therapy (Signed)
 " OUTPATIENT PHYSICAL THERAPY TREATMENT  Progress Note  Reporting Period 02/26/2024 to 03/27/2024   See note below for Objective Data and Assessment of Progress/Goals.     Patient Name: Bradley Peterson MRN: [id number] DOB:11-12-1940, 84 y.o., male Today's Date: 03/27/2024  END OF SESSION:  PT End of Session - 03/27/24 0844     Visit Number 10    Date for Recertification  04/22/24    Authorization Type [employer]    Progress Note Due on Visit 20    PT Start Time 0844    PT Stop Time 0929    PT Time Calculation (min) 45 min    Activity Tolerance Patient tolerated treatment well    Behavior During Therapy [facility] for tasks assessed/performed            Past Medical History:  Diagnosis Date   Allergic state 12/27/2013   Allergy    Arthritis of neck    Heart murmur    Positive TB test    Past Surgical History:  Procedure Laterality Date   COLONOSCOPY  01/2011   DENTAL SURGERY     TONSILLECTOMY     Late 1940s   UPPER GASTROINTESTINAL ENDOSCOPY  01/2011   Patient Active Problem List   Diagnosis Date Noted   Allergic contact dermatitis due to plants, except food 11/26/2022   Hyperglycemia 02/04/2022   Neck pain 07/11/2020   H/O colonoscopy with polypectomy 01/11/2020   OA (osteoarthritis) of knee 06/19/2017   Preventative health care 01/06/2017   Right bundle branch block 03/13/2016   Medicare annual wellness visit, subsequent 12/27/2013   Allergy 12/27/2013   Abnormal echocardiogram 01/07/2013   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   Positive PPD, 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   Internal hemorrhoid 02/25/2012   Atypical chest pain 02/25/2012   Anemia 02/25/2012    PCP: Domenica Harlene LABOR, MD   REFERRING PROVIDER: Domenica Harlene LABOR, MD   REFERRING DIAG:  R73.9 (ICD-10-CM) - Hyperglycemia  M47.812 (ICD-10-CM) - Osteoarthritis of cervical spine, unspecified spinal osteoarthritis complication status  R42 (ICD-10-CM) - Disequilibrium  R53.1 (ICD-10-CM) - Weakness   THERAPY DIAG:   Unsteadiness on feet  Abnormal posture  Muscle weakness (generalized)  RATIONALE FOR EVALUATION AND TREATMENT: Rehabilitation  ONSET DATE: January 2025 for balance issues, chronic neck pain/OA since 1980s  NEXT MD VISIT: 08/10/2024   SUBJECTIVE:   SUBJECTIVE STATEMENT: Pt notes benefit from PT but still feels that there is room for improvement with his overall strength and balance.  EVAL:  Pt reports he initially went to see PCP for sinus drainage issues that have led to stomach issues.  He also noted issues with his balance to his MD, which is biggest his concern.  He denies any falls but is very careful to avoid circumstances where he might fall.  He has OA in his neck which was first diagnosed several years ago.  He was given exercises at the time that he uses when his neck bothers him.  Also goes to a massage therapist ~1x/mo to help with his neck.  He notes some weakness in his arms primarily.  On rare occasion he will have tingling feeling down into forearms.  PAIN: Are you having pain? Yes: NPRS scale: 0/10  Pain location: B legs and arms   PERTINENT HISTORY: OA, R BBB, atypical chest pain  PRECAUTIONS: None  HAND DOMINANCE: Right  RED FLAGS: None  WEIGHT BEARING RESTRICTIONS: No  FALLS:  Has patient fallen in last 6 months? No  LIVING ENVIRONMENT:  Lives with: lives with their spouse Lives in: House Stairs: Yes: External: 8 steps; on left going up Has following equipment at home: Single point cane  OCCUPATION: Retired  PLOF: Independent and Leisure: yardwork, walk 2 miles - 4x/wk  PATIENT GOALS: Figure out balance. Also work better with shoulders including better strength with lifting.   OBJECTIVE: (objective measures completed at initial evaluation unless otherwise dated)  DIAGNOSTIC FINDINGS:  None available  PATIENT SURVEYS:  ABC scale: The Activities-Specific Balance Confidence (ABC) Scale 0% 10 20 30  40 50 60 70 80 90 100% No  confidence<->completely confident  How confident are you that you will not lose your balance or become unsteady when you . . .  Date tested 02/26/2024  03/27/2024   Walk around the house 100% 90%  2. Walk up or down stairs 90% 80%  3. Bend over and pick up a slipper from in front of a closet floor 90% 70%  4. Reach for a small can off a shelf at eye level 100% 90%  5. Stand on tip toes and reach for something above your head 90% 90%  6. Stand on a chair and reach for something 70% 80%  7. Sweep the floor 100% 100%  8. Walk outside the house to a car parked in the driveway 100% 100%  9. Get into or out of a car 90% 100%  10. Walk across a parking lot to the mall 90% 90%  11. Walk up or down a ramp 90% 90%  12. Walk in a crowded mall where people rapidly walk past you 90% 90%  13. Are bumped into by people as you walk through the mall 80% 90%  14. Step onto or off of an escalator while you are holding onto the railing 90% 90%  15. Step onto or off an escalator while holding onto parcels such that you cannot hold onto the railing 80% 70%  16. Walk outside on icy sidewalks 50% 60%  Total: #/16 87.5%  86.3%    NDI:  NECK DISABILITY INDEX   Date:  Score - 02/26/2024  03/27/2024   Pain intensity 1 = The pain is very mild at the moment 1  2. Personal care (washing, dressing, etc.) 0 = I can look after myself normally without causing extra pain 0  3. Lifting 0 =  I can lift heavy weights without extra pain 0  4. Reading 1 = I can read as much as I want to with slight pain in my neck 1  5. Headaches 0 = I have no headaches at all 0  6. Concentration 1 =  I can concentrate fully when I want to with slight difficulty  0  7. Work 0 =  I can do as much work as I want to 0  8. Driving 1 =  I can drive my car as long as I want with slight pain in my neck 1  9. Sleeping 1 = My sleep is slightly disturbed (less than 1 hr sleepless) 0  10. Recreation 2 = I am able to engage in most, but not all of my usual  recreation activities because of   pain in my neck 2  Total 7/50 5/50  % Disability 14.0% 10.0%   Minimum Detectable Change (90% confidence): 5 points or 10% points  COGNITION: Overall cognitive status: Within functional limits for tasks assessed     SENSATION: WFL  POSTURE:  forward head and rounded, elevated shoulders  PALPATION: Increased muscle  tension with mild soreness on palpation (however pt reports he just had a massage yesterday)  CERVICAL ROM:   Active ROM Eval 03/18/24  Flexion 45 42  Extension 40 p! 40  Right lateral flexion 16 23- mild pain  Left lateral flexion 19 30- mild pain  Right rotation 42 57  Left rotation 48 57   (Blank rows = not tested)  UPPER EXTREMITY ROM:  B shoulder WFL  UPPER EXTREMITY MMT:  MMT Right eval Left eval R 03/20/24 L 03/20/24  Shoulder flexion 5 5 5 5   Shoulder extension 4 4+    Shoulder abduction 4 4 4      5/10 pain 5  Shoulder adduction      Shoulder internal rotation 4+ 4+ 4+ 4+  Shoulder external rotation 4+ 4+ 4+ 4+  Middle trapezius 4 4 4 4   Lower trapezius 3 3- 4- 3+  (Blank rows = not tested)  MUSCLE LENGTH: Hamstrings: mild tight B ITB:  Piriformis: mild tight B Hip flexors: WFL Quads: mild/mod tight R>L Heelcord:   LOWER EXTREMITY ROM: Grossly WFL  LOWER EXTREMITY MMT:  MMT Right eval Left eval R 03/24/24 L 03/24/24  Hip flexion 4+ 4+ 5 5  Hip extension 4+ 4+ 5 5  Hip abduction 4- 4- 4 4  Hip adduction 4 4 4+ 4+  Hip internal rotation 4+ 4+ 4 5  Hip external rotation 4+ 4+ 5 5  Knee flexion 5 5 5 5   Knee extension 5 5 5 5   Ankle dorsiflexion 4 4+ 4+ 5  Ankle plantarflexion 4+ 5 5 5   Ankle inversion 4+ 4+ 4+ 5  Ankle eversion 4 4 4+ 4+   (Blank rows = not tested)  FUNCTIONAL TESTS:  5 times sit to stand: 18.65 sec Timed up and go (TUG): 9.04 sec 10 meter walk test: 8.54 sec Gait speed: 3.84 ft/sec  Functional gait assessment:  FUNCTIONAL GAIT ASSESSMENT   Date:  Score - 02/26/2024  03/27/2024    GAIT LEVEL SURFACE Instructions: Walk at your normal speed from here to the next mark (6 m) [20 ft]. (2) Mild impairment - Walks 6 m (20 ft) in less than 7 seconds but greater than 5.5 seconds, uses assistive device, slower speed, mild gait deviations, or deviates 15.24 -25.4 cm (6 -10 in) outside of the 30.48-cm (12-in) walkway width. 3  2.   CHANGE IN GAIT SPEED Instructions: Begin walking at your normal pace (for 1.5 m [5 ft]). When I tell you go, walk as fast as you can (for 1.5 m [5 ft]). When I tell you slow, walk as slowly as you can (for 1.5 m [5 ft]. (3) Normal - Able to smoothly change walking speed without loss of balance or gait deviation. Shows a significant difference in walking speeds between normal, fast, and slow speeds. Deviates no more than 15.24 cm (6 in) outside of the 30.48-cm (12-in) walkway width. 3  3.    GAIT WITH HORIZONTAL HEAD TURNS Instructions: Walk from here to the next mark 6 m (20 ft) away. Begin walking at your normal pace. Keep walking straight; after 3 steps, turn your head to the right and keep walking straight while looking to the right. After 3 more steps, turn your head to the left and keep walking straight while looking left. Continue alternating looking right and left. (3) Normal - Performs head turns smoothly with no change in gait. Deviates no more than 15.24 cm (6 in) outside 30.48-cm (12-in) walkway width. 3  4.   GAIT WITH VERTICAL HEAD TURNS Instructions: Walk from here to the next mark (6 m [20 ft]). Begin walking at your normal pace. Keep walking straight; after 3 steps, tip your head up and keep walking straight while looking up. After 3 more steps, tip your head down, keep walking straight while looking down. Continue  alternating looking up and down every 3 steps until you have completed 2 repetitions in each direction. (3) Normal - Performs head turns with no change in gait. Deviates no more than 15.24 cm (6 in) outside 30.48-cm (12-in) walkway  width. 3  5.  GAIT AND PIVOT TURN Instructions: Begin with walking at your normal pace. When I tell you, turn and stop, turn as quickly as you can to face the opposite direction and stop. (3) Normal - Pivot turns safely within 3 seconds and stops quickly with no loss of balance 3  6.   STEP OVER OBSTACLE Instructions: Begin walking at your normal speed. When you come to the shoe box, step over it, not around it, and keep walking. (2) Mild impairment - Is able to step over one shoe box (11.43 cm [4.5 in] total height) without changing gait speed; no evidence of imbalance. 2  7.   GAIT WITH NARROW BASE OF SUPPORT Instructions: Walk on the floor with arms folded across the chest, feet aligned heel to toe in tandem for a distance of 3.6 m [12 ft]. The number of steps taken in a straight line are counted for a maximum of 10 steps. (1) Moderate impairment - Ambulates 4 -7 steps. 3  8.   GAIT WITH EYES CLOSED Instructions: Walk at your normal speed from here to the next mark (6 m [20 ft]) with your eyes closed. (1) Moderate impairment - Walks 6 m (20 ft), slow speed, abnormal gait pattern, evidence for imbalance, deviates 25.4 -38.1 cm (10 -15 in) outside 30.48-cm (12-in) walkway width. Requires more than 9 seconds to ambulate 6 m (20 ft). 3  9.   AMBULATING BACKWARDS Instructions: Walk backwards until I tell you to stop (2) Mild impairment - Walks 6 m (20 ft), uses assistive device, slower speed, mild gait deviations, deviates 15.24 -25.4 cm (6 -10 in) outside 30.48-cm (12-in) walkway width 3  10. STEPS Instructions: Walk up these stairs as you would at home (ie, using the rail if necessary). At the top turn around and walk down. (2) Mild impairment-Alternating feet, must use rail. 2  Total 22/30 28/30  Interpretation: 19-24 = medium risk fall  25-28 = low risk fall    Interpretation of scores: Non-Specific Older Adults Cutoff Score: <=22/30 = risk of falls Parkinsons Disease Cutoff score <15/30= fall  risk (Hoehn & Yahr 1-4)  Minimally Clinically Important Difference (MCID)  Stroke (acute, subacute, and chronic) = MDC: 4.2 points Vestibular (acute) = MDC: 6 points Community Dwelling Older Adults =  MCID: 4 points Parkinsons Disease  =  MDC: 4.3 points  (Academy of Neu[employer]ic Physical Therapy (nd). Functional Gait Assessment. Retrieved from https://www.neuropt.org/docs/default-source/cpgs/core-outcome-measures/function-gait-assessment-pocket-guide-proof9-(2).pdf?sfvrsn=b74f35043_0.) SLS: R = 30.94 sec, L = 30.50 sec  BED MOBILITY:  Independent  TRANSFERS: Assistive device utilized: None  Sit to stand: Complete Independence Stand to sit: Complete Independence Chair to chair: Complete Independence Floor: NT  GAIT: Distance walked: clinic distances Assistive device utilized: None Level of assistance: Complete Independence Gait pattern: WFL  STAIRS:  Level of Assistance: Modified independence  Stair Negotiation Technique: Alternating Pattern Forwards with Single Rail on Right on descent only  Number of Stairs: 14   Height of Stairs: 7    TODAY'S TREATMENT:   03/27/2024  THERAPEUTIC EXERCISE: To improve strength and endurance.  Demonstration, verbal and tactile cues throughout for technique.  UBE - L2.0 x 3' each fwd & back  THERAPEUTIC ACTIVITIES: To improve functional performance.  Demonstration, verbal and tactile cues throughout for technique.  NDI: 5/50 = 10.0% ABC Scale: 1380 / 1600 = 86.3 % Goal assessment  PHYSICAL PERFORMANCE TEST or MEASUREMENT: Functional Gait  Assessment  Gait Level Surface Walks 20 ft in less than 5.5 sec, no assistive devices, good speed, no evidence for imbalance, normal gait pattern, deviates no more than 6 in outside of the 12 in walkway width.   Change in Gait Speed Able to smoothly change walking speed without loss of balance or gait deviation. Deviate no more than 6 in outside of the 12 in walkway width.   Gait with Horizontal Head  Turns Performs head turns smoothly with no change in gait. Deviates no more than 6 in outside 12 in walkway width   Gait with Vertical Head Turns Performs head turns with no change in gait. Deviates no more than 6 in outside 12 in walkway width.   Gait and Pivot Turn Pivot turns safely within 3 sec and stops quickly with no loss of balance.   Step Over Obstacle Is able to step over one shoe box (4.5 in total height) without changing gait speed. No evidence of imbalance.   Gait with Narrow Base of Support Is able to ambulate for 10 steps heel to toe with no staggering.   Gait with Eyes Closed Walks 20 ft, no assistive devices, good speed, no evidence of imbalance, normal gait pattern, deviates no more than 6 in outside 12 in walkway width. Ambulates 20 ft in less than 7 sec.   Ambulating Backwards Walks 20 ft, no assistive devices, good speed, no evidence for imbalance, normal gait   Steps Alternating feet, must use rail.   Total Score 28   FGA comment: 25-28 = low risk fall       NEUROMUSCULAR RE-EDUCATION: To improve strength, coordination, kinesthesia, posture, proprioception, balance, and reduce fall risk. Prone over orange Pball on mat table with B 1# db: I's x 10 T's x 10 Y's x 10 W's x 10 SLS + opp LE GTB 4-way SLR x 10, chair at side for balance PRN but minimal need to use chair   03/24/2024  THERAPEUTIC EXERCISE: To improve strength and endurance.  Demonstration, verbal and tactile cues throughout for technique.  NuStep - L5 x 6' (UE/LE)  THERAPEUTIC ACTIVITIES: To improve functional performance.  Demonstration, verbal and tactile cues throughout for technique.  5xSTS = 11.38 sec LE MMT Goal assessment  NEUROMUSCULAR RE-EDUCATION: To improve strength, coordination, kinesthesia, posture, proprioception, and balance.  Prone over orange Pball on mat table: I's x 10 T's x 10 Y's x 10 W's x 10 B side stepping with GTB at ankles 3 x 15' Fwd monster walk with GTB at ankles 2 x  40' Backward monster walk with GTB at ankles 2 x 25' Standing on round wobble board (UE support on back of chair with mat table behind for safety): Lateral weight shifts x 20 Ant/post (heel/toe) weight shifts x 20 Static balance w/o UE support 2 x ~15   03/20/24 UBE L1.0 3 min fwd and 3 min back UE MMT Standing shoulder flexion 1lb B x 20 Standing shoulder abduction 1lb x 20  Braiding and  tandem gait 2x37ft no support SBA  Tandem gait and sidestepping on balance beam 6x down and back Standing on balance beam x Perturbations standing on balance beam x 30 sec  Standing on dynadisk balance x Standing on dynadisk 4 way weight shifting x 10 Lower trap V slide up the wall with lift off YTB around wirst x 10   03/18/24 NuStep - L5 x 6' (UE/LE) Checked cervical AROM Seated assisted cervical sidebends with pillowcase B 10x3 sec  Seated cerv retraction with pillowcase 10x3 sec Scapular clocks BUE RTB around wrist 5x AL, L, PL Standing shoulder flexion 1lb BUE with chin tuck x 10 Standing shoulder ABD 1lb BUE with chin tuck x 10 Standing hip ADD with RTB at ankle 2x10 B Standing hip ABD with RTB at ankle 2x10 B  Lateral step ups BLE 6 x12 with toe tap to floor Eccentric step downs BLE 6 x 12  Monster walks RTB at ankles 4x14ft   03/13/24 NuStep - L5 x 6' (UE/LE) Sidelying clamshell RTB 10x5 sec R/L  Standing on Airex pad: Lateral weight shift x 20 Ant/post weight shift x 20 March in place x 20 Mini-squat 2 x 10  Tandem stance on airex 2x30 sec Sidestepping with RTB at ankles 4x20 ft Monster walk RTB at ankles 4x1ft V lower trap YTB looped around wrist 2 x 10 Bridging with RTB hip ABD 15x5 sec   03/10/2024  THERAPEUTIC EXERCISE: To improve strength and endurance.  Demonstration, verbal and tactile cues throughout for technique.  NuStep - L5 x 6' (UE/LE)  NEUROMUSCULAR RE-EDUCATION: To improve strength, coordination, kinesthesia, posture, proprioception,  balance, and reduce fall risk. B scap retraction + depression with GTB draped over shoulders and crossed behind back 2 x 10 Standing back to doorframe - GTB B scap retraction + shoulder horiz ABD x 10 Standing back to doorframe - GTB B scap retraction + shoulder horiz ABD diagonals x 10 B Lower trap setting at wall: V wall slide x 10 V wall slide + slight lift-off at top of motion x 10 V wall slide with looped YTB at wrists + slight lift-off at top of motionx 10 SLS + 7-way toe tap to colored dots x 3 cycles B SLS + 8-way toe tap to colored dots x 3 cycles B Standing on Airex pad: Lateral weight shift x 10 Ant/post weight shift x 10 March in place x 20 Mini-squat 2 x 10 - pt noted to shift to R, therefore provided mirror feedback to focus on even weight STS from mat table w/o UE assist x 10   03/07/23 Nustep L5x68min UE/LE 5xSTS assessed HEP update  Functional squats 2x10 Sidestepping along counter RTB at ankles 4x each direction Standing hip abduction RTB at ankles 2x10  Standing RTB hip ABD/extension diagonal 2x10 B Seated rows 20lb 2x12 low grips Lat pull 20lb 2x10 Tandem gait 4x15 ft Retro gait 4x63ft Braiding 2x17ft   03/04/23 Nustep L4x15min UE/LE Seated cervical extension with pillowcase for support 10x5 Seated cervical rotation SNAG B with pillowcase 10x5  Standing rows GTB 2x10 Standing shoulder extension GTB 2x10 Standing chin tuck with ball on wall 10x5 Standing back to doorframe B shoulder ER GTB 2x10 Standing hip abduction RTB at ankles 2x10 Standing RTB hip ABD/extension diagonal x 10 B Sidestepping along counter RTB at ankles 2x each direction   02/28/2024  THERAPEUTIC EXERCISE: To improve strength, endurance, ROM, and flexibility.  Demonstration, verbal and tactile cues throughout for technique.  NuStep - L4  x 6' (UE/LE) Seated cervical retraction 10 x 5 Backward shoulder rolls x 10 Seated scapular retraction 10 x 5 Seated UT stretch 2 x 30  B Seated LS stretch 2 x 30 B  NEUROMUSCULAR RE-EDUCATION: To improve strength, coordination, kinesthesia, posture, proprioception, and balance. Seated scapular retraction + GTB B shoulder ER 2 x 10 - pt demonstrating tendency for R shoulder shrug, therefore moved him into chair with pool noodle along spine to provide tactile cues for scapular activation Standing scapular retraction + GTB B row 2 x 10 Standing scapular retraction + GTB B shoulder extension 2 x 10 Standing RTB hip ABD 2 x 10 B Standing RTB hip ABD/extension diagonal x 10 B   02/26/2024 - Eval SELF CARE:  Reviewed eval findings and role of PT in addressing identified deficits as well as review of his personal HEP and instruction in initial HEP (see below).    PATIENT EDUCATION:  Education details: standardized testing results and interpretation, progress with PT, ongoing PT POC, continue with current HEP, and postural awareness  Person educated: Patient Education method: Explanation Education comprehension: verbalized understanding  HOME EXERCISE PROGRAM: Access Code: [email or web address] URL: [email or web address] Date: 03/24/2024 Prepared by: Elijah Hidden  Exercises - Seated Cervical Retraction  - 2 x daily - 7 x weekly - 2 sets - 10 reps - 3-5 sec hold - Seated Cervical Sidebending with Strap and Overpressure  - 1 x daily - 7 x weekly - 2 sets - 10 reps - 3-5 sec hold - Standing Backward Shoulder Rolls  - 2 x daily - 7 x weekly - 2 sets - 10 reps - 3 sec hold - Seated Scapular Retraction  - 2 x daily - 7 x weekly - 2 sets - 10 reps - 3-5 sec hold - Seated Upper Trapezius Stretch  - 2 x daily - 7 x weekly - 3 reps - 30 sec hold - Gentle Levator Scapulae Stretch  - 2 x daily - 7 x weekly - 3 reps - 30 sec hold - Standing Bilateral Low Shoulder Row with Anchored Resistance  - 1 x daily - 7 x weekly - 2 sets - 10 reps - 5 sec hold - Standing Low Trap Setting with Resistance at Wall  - 1 x daily - 3 x weekly - 2 sets -  10 reps - Shoulder External Rotation and Scapular Retraction with Resistance  - 1 x daily - 3 x weekly - 2 sets - 10 reps - 3- 5 sec hold - Standing Hip Abduction with Resistance at Ankles and Counter Support  - 1 x daily - 7 x weekly - 2 sets - 10 reps - 3 sec hold - Diagonal Hip Extension with Resistance  - 1 x daily - 7 x weekly - 2 sets - 10 reps - 3 sec hold - Tandem Walking with Counter Support  - 1 x daily - 7 x weekly - 2-3 sets - 10 reps - Backward Tandem Walking with Counter Support  - 1 x daily - 7 x weekly - 2-3 sets - 10 reps - Carioca with Counter Support  - 1 x daily - 7 x weekly - 2-3 sets - 10 reps - Prone Shoulder Extension on Swiss Ball  - 1 x daily - 3 x weekly - 2 sets - 10 reps - 3 sec hold - Prone Middle Trapezius Strengthening on Swiss Ball  - 1 x daily - 3 x weekly - 2 sets - 10 reps - 3 sec hold -  Prone Shoulder W on Swiss Ball  - 1 x daily - 3 x weekly - 2 sets - 10 reps - 3 sec hold - Prone Lower Trapezius Strengthening on Swiss Ball  - 1 x daily - 3 x weekly - 2 sets - 10 reps - 3 sec hold   ASSESSMENT:  CLINICAL IMPRESSION: Bradley Peterson notes benefit from PT thus far noting awareness of improving balance with more confidence with walking on the recent ice and snow, but still feels like his balance needs more work.  ABC score not revealing significant change at this time, although good gains observed on FGA from 22/30 to 28/30 indicating a reduction in fall risk from medium to low.  Recent strength testing revealing gains across most muscle groups with main remaining weakness noted in lateral hip musculature as well as postural muscles with patient also perceiving weakness in these areas.  Cervical pain mostly resolved with gains noted in majority of planes of ROM.  Posture improving with better scapular muscle engagement observed and lessening of forward head and elevated, rounded shoulder posture.  Continued strengthening exercises today focusing on core and postural  muscle engagement as well as proximal LE strengthening adding increasing balance component to further reduce fall risk.  Bradley Peterson is progressing well toward his PT goals (all STG's now met) and will benefit from continued skilled PT to address ongoing postural ROM/flexibility, strength and balance deficits to improve mobility and activity tolerance with decreased pain interference and decreased risk for falls.    EVAL: Bradley Peterson is a 84 y.o. male who was referred to physical therapy for evaluation and treatment for cervical OA, disequilibrium and weakness.   Patient reports chronic intermittent neck pain related to cervical OA originating back in the 1980s.  He is typically able to manage the neck pain with exercises has has been given in the past. Patient has deficits in cervical ROM, postural and proximal LE flexibility, UE and LE strength, abnormal posture, and TTP with abnormal muscle tension which are interfering with ADLs and are impacting quality of life.  On NDI patient scored 7/50 demonstrating 14% disability.  His biggest concern is his balance and fear of falling.  Gait speed of 3.84 ft/sec, (2.62 ft/sec is needed for community access) and TUG of 9.04 sec (>13.5 sec indicates increased risk for falls) are WNL, however examination revealed patient is at risk for falls and functional decline as evidenced by the following objective test measures: and 5xSTS of 18.65 sec (>15 sec indicates increased risk for falls and decreased BLE power) and FGA score of 22/30 (19-24 = medium risk for falls). ABC scale score of 87.5% indicates a high level of physical functioning.  Bradley Peterson will benefit from skilled PT to address above deficits to improve mobility and activity tolerance to help reach the maximal level of functional independence and mobility, with decreased risk for falls. Patient demonstrates understanding of this POC and is in agreement with this plan.    OBJECTIVE IMPAIRMENTS: decreased balance,  decreased knowledge of condition, decreased ROM, decreased strength, decreased safety awareness, increased fascial restrictions, impaired perceived functional ability, increased muscle spasms, impaired flexibility, improper body mechanics, postural dysfunction, and pain.   ACTIVITY LIMITATIONS: sleeping, locomotion level, and caring for others  PARTICIPATION LIMITATIONS: driving, community activity, and yard work  PERSONAL FACTORS: Age, Past/current experiences, Time since onset of injury/illness/exacerbation, and 3+ comorbidities: OA, R BBB, atypical chest pain are also affecting patient's functional outcome.   REHAB POTENTIAL: Excellent  CLINICAL  DECISION MAKING: Evolving/moderate complexity  EVALUATION COMPLEXITY: Moderate   GOALS: Goals reviewed with patient? Yes  SHORT TERM GOALS: Target date: 03/25/2024  Patient will be independent with initial HEP. Baseline: HEP initiated on eval 02/28/24 - HEP reviewed and updated Goal status: MET - 03/10/24  2.  Patient will improve 5x STS time to </= 15 seconds to demonstrate improved functional strength and transfer efficiency. Baseline: 18.65 03/07/23 - 18.57 sec Goal status: MET - 03/24/24 - 11.38 sec  LONG TERM GOALS: Target date: 04/22/2024  Patient will be independent with advanced/ongoing HEP to improve outcomes and carryover.  Baseline:  03/24/24 - HEP updated at pt request Goal status: IN PROGRESS - 03/27/24 - met for current HEP  2.  Patient will report at least 50% improvement in neck pain to improve QOL. Baseline: 2/10 Goal status: IN PROGRESS - 03/13/24 - pt reports his pain is pretty much gone now  3.  Patient to demonstrate ability to achieve and maintain good spinal alignment/posturing and body mechanics needed for daily activities. Baseline: forward head and rounded, elevated shoulders Goal status: IN PROGRESS - 03/27/24 - pt demonstrating lessening forward head and rounded, elevated shoulders  4.  Patient will demonstrate  improved cervical AROM to [facility] to allow for improved safety with checking blind spot while driving. Baseline: Refer to above cervical ROM table Goal status: IN PROGRESS - 03/18/24 - see above table  5.  Patient will demonstrate improved B UE and postural strength to >/= 4+/5 for functional UE use. Baseline: Refer to above UE MMT table Goal status: IN PROGRESS - 03/20/24 - see table above  6.  Patient will demonstrate improved B LE strength to >/= 4+/5 for improved stability and ease of mobility. Baseline: Refer to above LE MMT table Goal status: IN PROGRESS - 03/24/24 - Met except B hip ABD and R hip IR 4/5  7.  Patient will report >/= 92% on ABC scale to demonstrate improved balance confidence. Baseline: 1400 / 1600 = 87.5 % Goal status: IN PROGRESS - 04/16/24 - 1380 / 1600 = 86.3 %  8.  Patient will report </= 8% on NDI to demonstrate improved functional ability. Baseline: 7 / 50 = 14.0 % Goal status: IN PROGRESS - 04/16/24 - 5/10 = 10.0%  9.  Patient will demonstrate at least 26/30 on FGA to decrease risk of falls. Baseline: 22/30 Goal status: MET - 03/27/24 - 28/30   PLAN:  PT FREQUENCY: 2x/week  PT DURATION: 8 weeks  PLANNED INTERVENTIONS: 97164- PT Re-evaluation, 97750- Physical Performance Testing, 97110-Therapeutic exercises, 97530- Therapeutic activity, 97112- Neuromuscular re-education, 97535- Self Care, [id number]- Manual therapy, [phone]- Gait training, [phone]- Electrical stimulation (unattended), 97035- Ultrasound, 79439 (1-2 muscles), 20561 (3+ muscles)- Dry Needling, Patient/Family education, Balance training, Taping, Joint mobilization, Spinal mobilization, Cryotherapy, and Moist heat  PLAN FOR NEXT SESSION:  progress postural and core/LE strengthening adding increased functional and dynamic balance focus; dynamic balance training on unsteady surface   Elijah CHRISTELLA Hidden, PT 03/27/2024, 12:59 PM  "

## 2024-03-31 ENCOUNTER — Ambulatory Visit

## 2024-04-03 ENCOUNTER — Ambulatory Visit: Admitting: Physical Therapy

## 2024-04-14 ENCOUNTER — Ambulatory Visit

## 2024-04-17 ENCOUNTER — Ambulatory Visit: Payer: Medicare HMO

## 2024-04-17 ENCOUNTER — Ambulatory Visit: Admitting: Physical Therapy

## 2024-04-21 ENCOUNTER — Ambulatory Visit: Admitting: Physical Therapy

## 2024-08-10 ENCOUNTER — Ambulatory Visit: Admitting: Family Medicine

## 2024-08-12 ENCOUNTER — Encounter: Admitting: Student

## 2025-02-15 ENCOUNTER — Encounter: Admitting: Family Medicine

## 2025-02-17 ENCOUNTER — Encounter: Admitting: Student
